# Patient Record
Sex: Female | Born: 1945 | Race: White | Hispanic: No | Marital: Married | Smoking: Never smoker
Health system: Southern US, Community
[De-identification: ages and names within clinical notes are randomized; demographics above are authoritative.]

## PROBLEM LIST (undated history)

## (undated) DIAGNOSIS — G8929 Other chronic pain: Secondary | ICD-10-CM

## (undated) DIAGNOSIS — M4712 Other spondylosis with myelopathy, cervical region: Secondary | ICD-10-CM

## (undated) DIAGNOSIS — R110 Nausea: Secondary | ICD-10-CM

## (undated) DIAGNOSIS — R531 Weakness: Secondary | ICD-10-CM

## (undated) DIAGNOSIS — R519 Headache, unspecified: Secondary | ICD-10-CM

## (undated) DIAGNOSIS — I471 Supraventricular tachycardia, unspecified: Secondary | ICD-10-CM

## (undated) DIAGNOSIS — Z8489 Family history of other specified conditions: Secondary | ICD-10-CM

## (undated) DIAGNOSIS — R072 Precordial pain: Secondary | ICD-10-CM

## (undated) DIAGNOSIS — T8859XA Other complications of anesthesia, initial encounter: Secondary | ICD-10-CM

## (undated) DIAGNOSIS — I059 Rheumatic mitral valve disease, unspecified: Secondary | ICD-10-CM

## (undated) DIAGNOSIS — R634 Abnormal weight loss: Secondary | ICD-10-CM

## (undated) DIAGNOSIS — N6019 Diffuse cystic mastopathy of unspecified breast: Secondary | ICD-10-CM

## (undated) DIAGNOSIS — M1990 Unspecified osteoarthritis, unspecified site: Secondary | ICD-10-CM

## (undated) DIAGNOSIS — R011 Cardiac murmur, unspecified: Secondary | ICD-10-CM

## (undated) DIAGNOSIS — I341 Nonrheumatic mitral (valve) prolapse: Secondary | ICD-10-CM

## (undated) DIAGNOSIS — I10 Essential (primary) hypertension: Secondary | ICD-10-CM

## (undated) DIAGNOSIS — I781 Nevus, non-neoplastic: Secondary | ICD-10-CM

## (undated) DIAGNOSIS — Z87442 Personal history of urinary calculi: Secondary | ICD-10-CM

## (undated) DIAGNOSIS — E119 Type 2 diabetes mellitus without complications: Secondary | ICD-10-CM

## (undated) DIAGNOSIS — R51 Headache: Secondary | ICD-10-CM

## (undated) DIAGNOSIS — R1084 Generalized abdominal pain: Secondary | ICD-10-CM

## (undated) DIAGNOSIS — Z9889 Other specified postprocedural states: Secondary | ICD-10-CM

## (undated) DIAGNOSIS — T4145XA Adverse effect of unspecified anesthetic, initial encounter: Secondary | ICD-10-CM

## (undated) DIAGNOSIS — K5790 Diverticulosis of intestine, part unspecified, without perforation or abscess without bleeding: Secondary | ICD-10-CM

## (undated) DIAGNOSIS — E785 Hyperlipidemia, unspecified: Secondary | ICD-10-CM

## (undated) DIAGNOSIS — G542 Cervical root disorders, not elsewhere classified: Secondary | ICD-10-CM

## (undated) DIAGNOSIS — E871 Hypo-osmolality and hyponatremia: Secondary | ICD-10-CM

## (undated) DIAGNOSIS — R112 Nausea with vomiting, unspecified: Secondary | ICD-10-CM

## (undated) DIAGNOSIS — K529 Noninfective gastroenteritis and colitis, unspecified: Secondary | ICD-10-CM

## (undated) DIAGNOSIS — K559 Vascular disorder of intestine, unspecified: Secondary | ICD-10-CM

## (undated) DIAGNOSIS — J22 Unspecified acute lower respiratory infection: Secondary | ICD-10-CM

## (undated) DIAGNOSIS — R194 Change in bowel habit: Secondary | ICD-10-CM

## (undated) DIAGNOSIS — I493 Ventricular premature depolarization: Secondary | ICD-10-CM

## (undated) DIAGNOSIS — I4719 Other supraventricular tachycardia: Secondary | ICD-10-CM

## (undated) DIAGNOSIS — B370 Candidal stomatitis: Secondary | ICD-10-CM

## (undated) DIAGNOSIS — I209 Angina pectoris, unspecified: Secondary | ICD-10-CM

## (undated) DIAGNOSIS — K279 Peptic ulcer, site unspecified, unspecified as acute or chronic, without hemorrhage or perforation: Secondary | ICD-10-CM

## (undated) DIAGNOSIS — I8390 Asymptomatic varicose veins of unspecified lower extremity: Secondary | ICD-10-CM

## (undated) DIAGNOSIS — K5900 Constipation, unspecified: Secondary | ICD-10-CM

## (undated) DIAGNOSIS — I83893 Varicose veins of bilateral lower extremities with other complications: Secondary | ICD-10-CM

## (undated) DIAGNOSIS — D649 Anemia, unspecified: Secondary | ICD-10-CM

## (undated) DIAGNOSIS — K219 Gastro-esophageal reflux disease without esophagitis: Secondary | ICD-10-CM

## (undated) DIAGNOSIS — N3090 Cystitis, unspecified without hematuria: Secondary | ICD-10-CM

## (undated) HISTORY — DX: Abnormal weight loss: R63.4

## (undated) HISTORY — DX: Hyperlipidemia, unspecified: E78.5

## (undated) HISTORY — DX: Other supraventricular tachycardia: I47.19

## (undated) HISTORY — DX: Nonrheumatic mitral (valve) prolapse: I34.1

## (undated) HISTORY — DX: Cervical root disorders, not elsewhere classified: G54.2

## (undated) HISTORY — DX: Peptic ulcer, site unspecified, unspecified as acute or chronic, without hemorrhage or perforation: K27.9

## (undated) HISTORY — DX: Nevus, non-neoplastic: I78.1

## (undated) HISTORY — PX: HERNIA REPAIR: SHX51

## (undated) HISTORY — DX: Asymptomatic varicose veins of unspecified lower extremity: I83.90

## (undated) HISTORY — DX: Type 2 diabetes mellitus without complications: E11.9

## (undated) HISTORY — DX: Diffuse cystic mastopathy of unspecified breast: N60.19

## (undated) HISTORY — PX: APPENDECTOMY: SHX54

## (undated) HISTORY — PX: ANTERIOR AND POSTERIOR VAGINAL REPAIR: SUR5

## (undated) HISTORY — DX: Vascular disorder of intestine, unspecified: K55.9

## (undated) HISTORY — PX: BREAST LUMPECTOMY: SHX2

## (undated) HISTORY — PX: ABDOMINAL HYSTERECTOMY: SHX81

## (undated) HISTORY — DX: Noninfective gastroenteritis and colitis, unspecified: K52.9

## (undated) HISTORY — DX: Constipation, unspecified: K59.00

## (undated) HISTORY — DX: Essential (primary) hypertension: I10

## (undated) HISTORY — DX: Candidal stomatitis: B37.0

## (undated) HISTORY — DX: Diverticulosis of intestine, part unspecified, without perforation or abscess without bleeding: K57.90

## (undated) HISTORY — DX: Hypo-osmolality and hyponatremia: E87.1

## (undated) HISTORY — DX: Ventricular premature depolarization: I49.3

## (undated) HISTORY — DX: Weakness: R53.1

## (undated) HISTORY — DX: Rheumatic mitral valve disease, unspecified: I05.9

## (undated) HISTORY — DX: Other chronic pain: G89.29

## (undated) HISTORY — DX: Varicose veins of bilateral lower extremities with other complications: I83.893

## (undated) HISTORY — DX: Generalized abdominal pain: R10.84

## (undated) HISTORY — DX: Change in bowel habit: R19.4

## (undated) HISTORY — DX: Unspecified acute lower respiratory infection: J22

## (undated) HISTORY — DX: Precordial pain: R07.2

## (undated) HISTORY — PX: CHOLECYSTECTOMY: SHX55

## (undated) HISTORY — PX: ELBOW SURGERY: SHX618

## (undated) HISTORY — DX: Supraventricular tachycardia: I47.1

## (undated) HISTORY — DX: Supraventricular tachycardia, unspecified: I47.10

## (undated) HISTORY — DX: Nausea: R11.0

## (undated) HISTORY — DX: Cystitis, unspecified without hematuria: N30.90

## (undated) HISTORY — DX: Other spondylosis with myelopathy, cervical region: M47.12

---

## 1997-07-23 ENCOUNTER — Other Ambulatory Visit: Admission: RE | Admit: 1997-07-23 | Discharge: 1997-07-23 | Payer: Self-pay | Admitting: Obstetrics and Gynecology

## 1998-10-08 ENCOUNTER — Other Ambulatory Visit: Admission: RE | Admit: 1998-10-08 | Discharge: 1998-10-08 | Payer: Self-pay | Admitting: Obstetrics and Gynecology

## 2001-07-14 ENCOUNTER — Other Ambulatory Visit: Admission: RE | Admit: 2001-07-14 | Discharge: 2001-07-14 | Payer: Self-pay | Admitting: Obstetrics and Gynecology

## 2001-07-22 ENCOUNTER — Ambulatory Visit (HOSPITAL_COMMUNITY): Admission: RE | Admit: 2001-07-22 | Discharge: 2001-07-22 | Payer: Self-pay | Admitting: Gastroenterology

## 2001-07-22 ENCOUNTER — Encounter: Payer: Self-pay | Admitting: Gastroenterology

## 2002-12-21 ENCOUNTER — Ambulatory Visit (HOSPITAL_COMMUNITY): Admission: RE | Admit: 2002-12-21 | Discharge: 2002-12-21 | Payer: Self-pay | Admitting: Gastroenterology

## 2002-12-29 ENCOUNTER — Ambulatory Visit (HOSPITAL_COMMUNITY): Admission: RE | Admit: 2002-12-29 | Discharge: 2002-12-29 | Payer: Self-pay | Admitting: Gastroenterology

## 2002-12-29 ENCOUNTER — Encounter: Payer: Self-pay | Admitting: Gastroenterology

## 2004-02-22 ENCOUNTER — Ambulatory Visit: Payer: Self-pay | Admitting: Family Medicine

## 2004-07-30 ENCOUNTER — Ambulatory Visit: Payer: Self-pay | Admitting: Family Medicine

## 2004-12-08 ENCOUNTER — Ambulatory Visit: Payer: Self-pay | Admitting: Family Medicine

## 2005-02-02 ENCOUNTER — Ambulatory Visit: Payer: Self-pay | Admitting: Family Medicine

## 2006-08-25 ENCOUNTER — Encounter: Admission: RE | Admit: 2006-08-25 | Discharge: 2006-08-25 | Payer: Self-pay | Admitting: Orthopedic Surgery

## 2013-01-02 ENCOUNTER — Other Ambulatory Visit: Payer: Self-pay | Admitting: *Deleted

## 2013-01-02 ENCOUNTER — Encounter: Payer: Self-pay | Admitting: Surgery

## 2013-01-02 DIAGNOSIS — I83893 Varicose veins of bilateral lower extremities with other complications: Secondary | ICD-10-CM

## 2013-01-27 ENCOUNTER — Encounter: Payer: Self-pay | Admitting: Surgery

## 2013-01-30 ENCOUNTER — Inpatient Hospital Stay (HOSPITAL_COMMUNITY): Admission: RE | Admit: 2013-01-30 | Payer: Self-pay | Source: Ambulatory Visit

## 2013-01-30 ENCOUNTER — Encounter (INDEPENDENT_AMBULATORY_CARE_PROVIDER_SITE_OTHER): Payer: Self-pay

## 2013-01-30 ENCOUNTER — Ambulatory Visit (HOSPITAL_COMMUNITY)
Admission: RE | Admit: 2013-01-30 | Discharge: 2013-01-30 | Disposition: A | Discharge status: Home / Self Care | Payer: BC Managed Care – PPO | Source: Ambulatory Visit | Attending: Surgery | Admitting: Surgery

## 2013-01-30 ENCOUNTER — Other Ambulatory Visit: Payer: Self-pay | Admitting: *Deleted

## 2013-01-30 ENCOUNTER — Ambulatory Visit (INDEPENDENT_AMBULATORY_CARE_PROVIDER_SITE_OTHER): Payer: BC Managed Care – PPO | Admitting: Vascular Surgery

## 2013-01-30 ENCOUNTER — Encounter: Payer: Self-pay | Admitting: Vascular Surgery

## 2013-01-30 ENCOUNTER — Encounter: Payer: Self-pay | Admitting: Surgery

## 2013-01-30 VITALS — BP 133/78 | HR 65 | Resp 16 | Ht 66.0 in | Wt 129.0 lb

## 2013-01-30 DIAGNOSIS — I83893 Varicose veins of bilateral lower extremities with other complications: Secondary | ICD-10-CM | POA: Insufficient documentation

## 2013-01-30 DIAGNOSIS — M79609 Pain in unspecified limb: Secondary | ICD-10-CM | POA: Insufficient documentation

## 2013-01-30 NOTE — Progress Notes (Signed)
Subjective:     Patient ID: Jasmine Farrell, female   DOB: [birth year], 67 y.o.   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 Her job requires her to stand for many hours and she has increasing discomfort as this occurs. She does wear long light elastic compression stockings with some but not complete improvement. She has a remote history of a possible DVT in the right femoral vein following a cardiac catheterization was treated briefly with Coumadin but has had no further sequela. Has no history of stasis ulcers or bleeding.  Past Medical History  Diagnosis Date  . Varicose veins   . Diabetes mellitus without complication   . Hyperlipidemia   . Mitral valve disorder   . Paroxysmal atrial tachycardia   . PVC's (premature ventricular contractions)   . Diffuse cystic mastopathy     History  Substance Use Topics  . Smoking status: Never Smoker   . Smokeless tobacco: Never Used  . Alcohol Use: No    Family History  Problem Relation Age of Onset  . Cancer Father     Allergies  Allergen Reactions  . Aspirin   . Codeine   . Latex   . Morphine And Related   . Nsaids   . Statins     Current outpatient prescriptions:benzonatate (TESSALON) 200 MG capsule, Take 200 mg by mouth 3 (three) times daily as needed for cough., Disp: , Rfl: ;  Calcium Carbonate-Vitamin D (CALCIUM 600 + D PO), Take by mouth daily., Disp: , Rfl: ;  cholecalciferol (VITAMIN D) 1000 UNITS tablet, Take 1,000 Units by mouth daily., Disp: , Rfl:  lidocaine (LIDODERM) 5 %, Place 1 patch onto the skin daily. Remove & Discard patch within 12 hours or as directed by MD, Disp: , Rfl: ;  metFORMIN (GLUCOPHAGE) 500 MG tablet, Take 500 mg by mouth daily. Takes 1/2 tablet daily., Disp: , Rfl: ;  ondansetron (ZOFRAN) 4 MG tablet, Take 4 mg by mouth every 8 (eight) hours as needed for  nausea., Disp: , Rfl: ;  propranolol (INDERAL) 80 MG tablet, Take 80 mg by mouth daily., Disp: , Rfl:  ranitidine (ZANTAC) 300 MG tablet, Take 300 mg by mouth at bedtime., Disp: , Rfl: ;  ibuprofen (ADVIL,MOTRIN) 800 MG tablet, Take 800 mg by mouth every 8 (eight) hours as needed for pain., Disp: , Rfl:   BP 133/78  Pulse 65  Resp 16  Ht 5\' 6"  (1.676 m)  Wt 129 lb (58.514 kg)  BMI 20.83 kg/m2  Body mass index is 20.83 kg/(m^2).         Review of Systems denies chest pain but does have occasional orthopnea and dyspnea on exertion. Has a history of cardiac arrhythmias and mitral valve prolapse. Complains of weakness in the arms and legs and numbness in the legs as well as dizziness. Also skin rashes are noted. All other systems negative and a complete review of     Objective:   Physical Exam BP 133/78  Pulse 65  Resp 16  Ht 5\' 6"  (1.676 m)  Wt 129 lb (58.514 kg)  BMI 20.83 kg/m2  Gen.-alert and oriented x3 in no apparent distress HEENT normal for age Lungs no rhonchi or wheezing Cardiovascular regular rhythm no murmurs carotid pulses 3+ palpable no bruits audible Abdomen soft nontender no palpable masses Musculoskeletal free of  major deformities  Skin clear -no rashes Neurologic normal Lower extremities 3+ femoral and dorsalis pedis pulses palpable bilaterally with no edema there are scattered spider and reticular veins in both lower extremities the worst area being the right posterior thigh and popliteal fossa. There is also some in the anterior thighs bilaterally. No distal edema is noted. No hyperpigmentation or ulceration is noted.  Today I ordered bilateral venous duplex exam which I reviewed and interpreted. There is no reflux in the superficial systems bilaterally and there is no DVT. Study is essentially normal.        Assessment:     Bilateral symptomatic spider and reticular veins with no superficial or deep venous reflux    Plan:     Have offered patient  sclerotherapy and she will consider this

## 2013-02-07 ENCOUNTER — Encounter: Payer: Self-pay | Admitting: *Deleted

## 2013-02-08 ENCOUNTER — Ambulatory Visit (INDEPENDENT_AMBULATORY_CARE_PROVIDER_SITE_OTHER): Payer: Self-pay | Admitting: *Deleted

## 2013-02-08 DIAGNOSIS — I781 Nevus, non-neoplastic: Secondary | ICD-10-CM

## 2013-02-08 NOTE — Progress Notes (Signed)
X=.3% Sotradecol and .5% Asclera administered with a 27g butterfly.  Patient received a total of 15cc.  Treated majority of her extensive spiders with a combo of sotradecol and asclera. Easy access. Tol well. Will follow prn.  Photos: yes  Compression stockings applied: yes

## 2013-02-09 ENCOUNTER — Encounter: Payer: Self-pay | Admitting: Vascular Surgery

## 2013-02-16 ENCOUNTER — Other Ambulatory Visit: Payer: Self-pay

## 2013-06-27 ENCOUNTER — Encounter: Payer: Self-pay | Admitting: *Deleted

## 2013-06-28 ENCOUNTER — Encounter: Payer: Self-pay | Admitting: Vascular Surgery

## 2013-06-28 ENCOUNTER — Ambulatory Visit (INDEPENDENT_AMBULATORY_CARE_PROVIDER_SITE_OTHER): Payer: Self-pay | Admitting: *Deleted

## 2013-06-28 DIAGNOSIS — I781 Nevus, non-neoplastic: Secondary | ICD-10-CM

## 2013-06-28 NOTE — Progress Notes (Signed)
X=.3% Sotradecol administered with a 27g butterfly.  Patient received a total of 5cc.  Having a good result from first tx. Did further clean up. She may need one more tx in 6 months. Tol well. Easy access. Follow prn.  Photos: yes  Compression stockings applied: yes

## 2015-09-27 DIAGNOSIS — M25512 Pain in left shoulder: Secondary | ICD-10-CM

## 2015-09-27 HISTORY — DX: Pain in left shoulder: M25.512

## 2015-10-11 DIAGNOSIS — R0789 Other chest pain: Secondary | ICD-10-CM

## 2015-10-11 HISTORY — DX: Other chest pain: R07.89

## 2016-01-29 DIAGNOSIS — M542 Cervicalgia: Secondary | ICD-10-CM | POA: Insufficient documentation

## 2016-01-29 DIAGNOSIS — M5030 Other cervical disc degeneration, unspecified cervical region: Secondary | ICD-10-CM | POA: Insufficient documentation

## 2016-01-29 HISTORY — DX: Cervicalgia: M54.2

## 2016-01-29 HISTORY — DX: Other cervical disc degeneration, unspecified cervical region: M50.30

## 2016-02-13 DIAGNOSIS — M48061 Spinal stenosis, lumbar region without neurogenic claudication: Secondary | ICD-10-CM

## 2016-02-13 HISTORY — DX: Spinal stenosis, lumbar region without neurogenic claudication: M48.061

## 2016-02-18 DIAGNOSIS — M5136 Other intervertebral disc degeneration, lumbar region: Secondary | ICD-10-CM | POA: Insufficient documentation

## 2016-02-18 DIAGNOSIS — M51369 Other intervertebral disc degeneration, lumbar region without mention of lumbar back pain or lower extremity pain: Secondary | ICD-10-CM

## 2016-02-18 HISTORY — DX: Other intervertebral disc degeneration, lumbar region without mention of lumbar back pain or lower extremity pain: M51.369

## 2016-06-26 ENCOUNTER — Other Ambulatory Visit: Payer: Self-pay | Admitting: Neurosurgery

## 2016-08-13 ENCOUNTER — Encounter (HOSPITAL_COMMUNITY): Payer: Self-pay

## 2016-08-13 ENCOUNTER — Encounter (HOSPITAL_COMMUNITY)
Admission: RE | Admit: 2016-08-13 | Discharge: 2016-08-13 | Disposition: A | Discharge status: Home / Self Care | Payer: Medicare Other | Source: Ambulatory Visit | Attending: Neurosurgery | Admitting: Neurosurgery

## 2016-08-13 DIAGNOSIS — E119 Type 2 diabetes mellitus without complications: Secondary | ICD-10-CM | POA: Diagnosis not present

## 2016-08-13 DIAGNOSIS — K219 Gastro-esophageal reflux disease without esophagitis: Secondary | ICD-10-CM | POA: Diagnosis not present

## 2016-08-13 DIAGNOSIS — E785 Hyperlipidemia, unspecified: Secondary | ICD-10-CM | POA: Insufficient documentation

## 2016-08-13 DIAGNOSIS — Z01812 Encounter for preprocedural laboratory examination: Secondary | ICD-10-CM | POA: Insufficient documentation

## 2016-08-13 DIAGNOSIS — Z7984 Long term (current) use of oral hypoglycemic drugs: Secondary | ICD-10-CM | POA: Diagnosis not present

## 2016-08-13 DIAGNOSIS — Z79899 Other long term (current) drug therapy: Secondary | ICD-10-CM | POA: Insufficient documentation

## 2016-08-13 DIAGNOSIS — D649 Anemia, unspecified: Secondary | ICD-10-CM | POA: Insufficient documentation

## 2016-08-13 DIAGNOSIS — I480 Paroxysmal atrial fibrillation: Secondary | ICD-10-CM | POA: Diagnosis not present

## 2016-08-13 HISTORY — DX: Nausea with vomiting, unspecified: Z98.890

## 2016-08-13 HISTORY — DX: Personal history of urinary calculi: Z87.442

## 2016-08-13 HISTORY — DX: Headache, unspecified: R51.9

## 2016-08-13 HISTORY — DX: Anemia, unspecified: D64.9

## 2016-08-13 HISTORY — DX: Nausea with vomiting, unspecified: R11.2

## 2016-08-13 HISTORY — DX: Cardiac murmur, unspecified: R01.1

## 2016-08-13 HISTORY — DX: Other complications of anesthesia, initial encounter: T88.59XA

## 2016-08-13 HISTORY — DX: Angina pectoris, unspecified: I20.9

## 2016-08-13 HISTORY — DX: Family history of other specified conditions: Z84.89

## 2016-08-13 HISTORY — DX: Gastro-esophageal reflux disease without esophagitis: K21.9

## 2016-08-13 HISTORY — DX: Headache: R51

## 2016-08-13 HISTORY — DX: Adverse effect of unspecified anesthetic, initial encounter: T41.45XA

## 2016-08-13 HISTORY — DX: Unspecified osteoarthritis, unspecified site: M19.90

## 2016-08-13 LAB — TYPE AND SCREEN
ABO/RH(D): O NEG
Antibody Screen: NEGATIVE

## 2016-08-13 LAB — CBC
HEMATOCRIT: 39.4 % (ref 36.0–46.0)
Hemoglobin: 13.3 g/dL (ref 12.0–15.0)
MCH: 30.2 pg (ref 26.0–34.0)
MCHC: 33.8 g/dL (ref 30.0–36.0)
MCV: 89.3 fL (ref 78.0–100.0)
Platelets: 220 10*3/uL (ref 150–400)
RBC: 4.41 MIL/uL (ref 3.87–5.11)
RDW: 13.3 % (ref 11.5–15.5)
WBC: 6.7 10*3/uL (ref 4.0–10.5)

## 2016-08-13 LAB — BASIC METABOLIC PANEL
Anion gap: 6 (ref 5–15)
BUN: 19 mg/dL (ref 6–20)
CALCIUM: 9.6 mg/dL (ref 8.9–10.3)
CO2: 26 mmol/L (ref 22–32)
Chloride: 107 mmol/L (ref 101–111)
Creatinine, Ser: 1 mg/dL (ref 0.44–1.00)
GFR calc Af Amer: >60 mL/min (ref >60)
GFR, EST NON AFRICAN AMERICAN: 55 mL/min — AB (ref >60)
GLUCOSE: 98 mg/dL (ref 65–99)
Potassium: 4.1 mmol/L (ref 3.5–5.1)
Sodium: 139 mmol/L (ref 135–145)

## 2016-08-13 LAB — ABO/RH: ABO/RH(D): O NEG

## 2016-08-13 LAB — GLUCOSE, CAPILLARY: GLUCOSE-CAPILLARY: 103 mg/dL — AB (ref 65–99)

## 2016-08-13 LAB — SURGICAL PCR SCREEN
MRSA, PCR: NEGATIVE
STAPHYLOCOCCUS AUREUS: NEGATIVE

## 2016-08-13 NOTE — Pre-Procedure Instructions (Signed)
Jasmine Farrell  08/13/2016      [facility] - Jasmine Farrell, [place] - [address] AT [facility] [address] [place] [address] Phone: [phone] Fax: [phone]    Your procedure is scheduled on May 9.  Report to [facility] Admitting at 815 A.M.  Call this number if you have problems the morning of surgery:  [phone]   Remember:  Do not eat food or drink liquids after midnight.  Take these medicines the morning of surgery with A SIP OF WATER Propranolol (Inderal), Propranolol (Innopran XL), tramadol (Ultram)   Stop taking aspirin, BC's, Goody's, Herbal medications, Ibuprofen, Advil. Motrin, Aleve, Viamins    How to Manage Your Diabetes Before and After Surgery  Why is it important to control my blood sugar before and after surgery? . Improving blood sugar levels before and after surgery helps healing and can limit problems. . A way of improving blood sugar control is eating a healthy diet by: o  Eating less sugar and carbohydrates o  Increasing activity/exercise o  Talking with your doctor about reaching your blood sugar goals . High blood sugars (greater than 180 mg/dL) can raise your risk of infections and slow your recovery, so you will need to focus on controlling your diabetes during the weeks before surgery. . Make sure that the doctor who takes care of your diabetes knows about your planned surgery including the date and location.  How do I manage my blood sugar before surgery? . Check your blood sugar at least 4 times a day, starting 2 days before surgery, to make sure that the level is not too high or low. o Check your blood sugar the morning of your surgery when you wake up and every 2 hours until you get to the Short Stay unit. . If your blood sugar is less than 70 mg/dL, you will need to treat for low blood sugar: o Do not take insulin. o Treat a low blood sugar (less than 70 mg/dL)  with  cup of clear juice (cranberry or apple), 4 glucose tablets, OR glucose gel. o Recheck blood sugar in 15 minutes after treatment (to make sure it is greater than 70 mg/dL). If your blood sugar is not greater than 70 mg/dL on recheck, call [phone] for further instructions. . Report your blood sugar to the short stay nurse when you get to Short Stay.  . If you are admitted to the hospital after surgery: o Your blood sugar will be checked by the staff and you will probably be given insulin after surgery (instead of oral diabetes medicines) to make sure you have good blood sugar levels. o The goal for blood sugar control after surgery is 80-180 mg/dL.      WHAT DO I DO ABOUT MY DIABETES MEDICATION?   Marland Kitchen Do not take oral diabetes medicines (pills) the morning of surgery.Metformin (Glucophage)       . The day of surgery, do not take other diabetes injectables, including Byetta (exenatide), Bydureon (exenatide ER), Victoza (liraglutide), or Trulicity (dulaglutide).  . If your CBG is greater than 220 mg/dL, you may take  of your sliding scale (correction) dose of insulin.  Other Instructions:          Patient Signature:  Date:   Nurse Signature:  Date:   Reviewed and Endorsed by [facility] Patient Education Committee, August 2015  Do not wear jewelry, make-up or nail  polish.  Do not wear lotions, powders, or perfumes, or deoderant.  Do not shave 48 hours prior to surgery.  Men may shave face and neck.  Do not bring valuables to the hospital.  [facility] is not responsible for any belongings or valuables.  Contacts, dentures or bridgework may not be worn into surgery.  Leave your suitcase in the car.  After surgery it may be brought to your room.  For patients admitted to the hospital, discharge time will be determined by your treatment team.  Patients discharged the day of surgery will not be allowed to drive home.    Special instructions:   - Preparing  for Surgery  Before surgery, you can play an important role.  Because skin is not sterile, your skin needs to be as free of germs as possible.  You can reduce the number of germs on you skin by washing with CHG (chlorahexidine gluconate) soap before surgery.  CHG is an antiseptic cleaner which kills germs and bonds with the skin to continue killing germs even after washing.  Please DO NOT use if you have an allergy to CHG or antibacterial soaps.  If your skin becomes reddened/irritated stop using the CHG and inform your nurse when you arrive at Short Stay.  Do not shave (including legs and underarms) for at least 48 hours prior to the first CHG shower.  You may shave your face.  Please follow these instructions carefully:   1.  Shower with CHG Soap the night before surgery and the  morning of Surgery.  2.  If you choose to wash your hair, wash your hair first as usual with your   normal shampoo.  3.  After you shampoo, rinse your hair and body thoroughly to remove the  Shampoo.  4.  Use CHG as you would any other liquid soap.  You can apply chg directly  to the skin and wash gently with scrungie or a clean washcloth.  5.  Apply the CHG Soap to your body ONLY FROM THE NECK DOWN.   Do not use on open wounds or open sores.  Avoid contact with your eyes, ears, mouth and genitals (private parts).  Wash genitals (private parts) with your normal soap.  6.  Wash thoroughly, paying special attention to the area where your surgery  will be performed.  7.  Thoroughly rinse your body with warm water from the neck down.  8.  DO NOT shower/wash with your normal soap after using and rinsing off  the CHG Soap.  9.  Pat yourself dry with a clean towel.            10.  Wear clean pajamas.            11.  Place clean sheets on your bed the night of your first shower and do not sleep with pets.  Day of Surgery  Do not apply any lotions/deoderants the morning of surgery.  Please wear clean clothes to the  hospital/surgery center.     Please read over the following fact sheets that you were given. Pain Booklet, Coughing and Deep Breathing, MRSA Information and Surgical Site Infection Prevention

## 2016-08-13 NOTE — Progress Notes (Signed)
PCP is Dr. Irena Reichmannana Collins States she saw Dr Tomie Chinaevankar last about 5 years ago.  States she had a stress test 5 years ago-requested from Dr Tomie Chinaevankar States she had a card cath 20 years or more ago. States she had an echo more than 5 years ago.- [facility] and Dr Tomie Chinaevankar Reports her fasting CBG"s run 100 or a little below.  She had a EKG done this am at Dr [facility]'s office- requested report.  Denies any chest pain. Reports a narrow airway Revonda Standardllison called to see pt.

## 2016-08-13 NOTE — Progress Notes (Addendum)
Anesthesia PAT Evaluation: Patient is a 71 year old female scheduled for C4-5, C5-6 ACDF on 08/19/16 by Dr. Lovell Sheehan.  History includes murmur/MV disorder (MVP), paroxysmal atrial tachycardia, PVCs, GERD, hyperlipidemia, diabetes mellitus type 2, anemia, arthritis, nephrolithiasis, headaches, varicose veins (s/p sclerotherapy), postoperative N/V, appendectomy, cholecystectomy '90's, hysterectomy, right breast lumpectomy, right inguinal hernia repair [facility]).   For anesthesia history she reported small airway, hypotension with nerve block, and difficulty waking up after anesthesia ("light weight"). She denied history of awake intubation.   She reported that > 20 years ago she had "V-tach" during heart catheterization that did not show any CAD (reportedly done at [facility] by Dr. Corinda Gubler or Dr. Juanda Chance). Possible catheter inducted VT. She said cath was done due to "angina" and palpitations, but denied any recent chest pains. She was last seen by Dr. Tomie China in [place] ~ 5 years ago with normal stress test. 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 She is on propranolol, without acute event in ~ 20 years other than increased ectopy when brand name Inderal was changed to generic propanolol. (She reports she is now back on a specific generic propranolol which she will bring with her to the hospital in case her brand is not formulary.)  She denied SOB, edema. Last year while dancing she had what sounded like a presyncopal episode while dancing. She reports a prior echo, but unsure of the date. She remains active--still going to the gym and walking 3 miles and playing pickle ball. She is no longer using machines due to her cervical precautions. She is currently wearing a soft collar.   PCP is Dr. Irena Reichmann ([facility]; Care Everywhere), last visit 08/13/16 for preoperative evaluation with EKG. Patient was  medically cleared for surgery pending acceptable lab results.   Meds include Lidoderm, metformin, Phenergan, propanolol, Zantac, tramadol.  BP 123/65   Pulse 74   Temp 36.7 C (Oral)   Resp 18   Ht 5\' 5"  (1.651 m)   Wt 132 lb 15 oz (60.3 kg)   SpO2 100%   BMI 22.12 kg/m   Heart RRR. I did not appreciate a murmur. Lungs clear. She is wearing a cervical soft collar. Mallampati II. Mouth opening appearing > 3FB. Somewhat prominent teeth. No LE edema noted.   EKG 08/13/16 (PCP): Requested.  Preoperative labs noted. CBC WNL. Cr 1.00. Glucose 98. A1c pending. She reports CBGs typically < 100 (up to 200 when on prednisone last year).   I will revisit chart once additional records received. Last cardiology and anesthesia records requested.  Velna Ochs [facility] Short Stay Center/Anesthesiology Phone [phone] 08/13/2016 7:03 PM  Addendum: Stress test received from [facility]. No cardiologist office note received. No echo at [facility] or [facility].   Nuclear stress test 09/21/06 [facility]): Conclusion: Stress portion: Overall it was an uneventful adenosine infusion without evidence of ischemia on the EKG. Nuclear portion: No ischemia seen on the scan. Normal gated imaging. Normal ejection fraction.  08/06/09 anesthesia record (right breast lumpectomy) and 07/18/12 (right femoral hernia repair) anesthesia record received from [facility]. Patient had SAB on 07/18/12 (no complications noted). Patient had GETA using Miller #2 to place 6.5 ETT on 08/06/09. Notes indicate that difficult airway history was from a prior surgery at [facility].  I'm still awaiting her 08/13/16 EKG, but previous 2014 tracing showed NSR. If EKG is not received then she would need a  new EKG on the day of surgery.  Reviewed currently available information with anesthesiologist Dr. Aleene DavidsonE. Fitzgerald. If no acute changes then it is anticipated that she can proceed as planned. As above, her neck  is in a neurtral position with soft collar, so anticipate need for Glidescope.   Velna Ochsllison Letty Salvi, PA-C [facility] Short Stay Center/Anesthesiology Phone [phone] 08/14/2016 1:16 PM

## 2016-08-14 LAB — HEMOGLOBIN A1C
Hgb A1c MFr Bld: 7.6 % — ABNORMAL HIGH (ref 4.8–5.6)
Mean Plasma Glucose: 171 mg/dL

## 2016-08-19 ENCOUNTER — Inpatient Hospital Stay (HOSPITAL_COMMUNITY)
Admission: RE | Admit: 2016-08-19 | Discharge: 2016-08-21 | DRG: 472 | Disposition: A | Discharge status: Home / Self Care | Payer: Medicare Other | Source: Ambulatory Visit | Attending: Neurosurgery | Admitting: Neurosurgery

## 2016-08-19 ENCOUNTER — Inpatient Hospital Stay (HOSPITAL_COMMUNITY): Payer: Medicare Other | Admitting: Vascular Surgery

## 2016-08-19 ENCOUNTER — Inpatient Hospital Stay (HOSPITAL_COMMUNITY)
Admission: RE | Disposition: A | Discharge status: Home / Self Care | Payer: Self-pay | Source: Ambulatory Visit | Attending: Neurosurgery

## 2016-08-19 ENCOUNTER — Inpatient Hospital Stay (HOSPITAL_COMMUNITY): Payer: Medicare Other | Admitting: Anesthesiology

## 2016-08-19 ENCOUNTER — Encounter (HOSPITAL_COMMUNITY): Payer: Self-pay | Admitting: *Deleted

## 2016-08-19 ENCOUNTER — Inpatient Hospital Stay (HOSPITAL_COMMUNITY): Payer: Medicare Other

## 2016-08-19 DIAGNOSIS — Z90710 Acquired absence of both cervix and uterus: Secondary | ICD-10-CM | POA: Diagnosis not present

## 2016-08-19 DIAGNOSIS — Z7984 Long term (current) use of oral hypoglycemic drugs: Secondary | ICD-10-CM | POA: Diagnosis not present

## 2016-08-19 DIAGNOSIS — E119 Type 2 diabetes mellitus without complications: Secondary | ICD-10-CM | POA: Diagnosis present

## 2016-08-19 DIAGNOSIS — Z79891 Long term (current) use of opiate analgesic: Secondary | ICD-10-CM

## 2016-08-19 DIAGNOSIS — I8390 Asymptomatic varicose veins of unspecified lower extremity: Secondary | ICD-10-CM | POA: Diagnosis present

## 2016-08-19 DIAGNOSIS — K219 Gastro-esophageal reflux disease without esophagitis: Secondary | ICD-10-CM | POA: Diagnosis present

## 2016-08-19 DIAGNOSIS — M50121 Cervical disc disorder at C4-C5 level with radiculopathy: Principal | ICD-10-CM | POA: Diagnosis present

## 2016-08-19 DIAGNOSIS — E785 Hyperlipidemia, unspecified: Secondary | ICD-10-CM | POA: Diagnosis present

## 2016-08-19 DIAGNOSIS — Z87442 Personal history of urinary calculi: Secondary | ICD-10-CM

## 2016-08-19 DIAGNOSIS — Z91040 Latex allergy status: Secondary | ICD-10-CM

## 2016-08-19 DIAGNOSIS — Z886 Allergy status to analgesic agent status: Secondary | ICD-10-CM | POA: Diagnosis not present

## 2016-08-19 DIAGNOSIS — Z888 Allergy status to other drugs, medicaments and biological substances status: Secondary | ICD-10-CM | POA: Diagnosis not present

## 2016-08-19 DIAGNOSIS — M4712 Other spondylosis with myelopathy, cervical region: Secondary | ICD-10-CM | POA: Diagnosis present

## 2016-08-19 DIAGNOSIS — Z791 Long term (current) use of non-steroidal anti-inflammatories (NSAID): Secondary | ICD-10-CM | POA: Diagnosis not present

## 2016-08-19 DIAGNOSIS — Z9049 Acquired absence of other specified parts of digestive tract: Secondary | ICD-10-CM

## 2016-08-19 DIAGNOSIS — I10 Essential (primary) hypertension: Secondary | ICD-10-CM | POA: Diagnosis present

## 2016-08-19 DIAGNOSIS — Z885 Allergy status to narcotic agent status: Secondary | ICD-10-CM | POA: Diagnosis not present

## 2016-08-19 DIAGNOSIS — I493 Ventricular premature depolarization: Secondary | ICD-10-CM | POA: Diagnosis present

## 2016-08-19 DIAGNOSIS — M1990 Unspecified osteoarthritis, unspecified site: Secondary | ICD-10-CM | POA: Diagnosis present

## 2016-08-19 DIAGNOSIS — I348 Other nonrheumatic mitral valve disorders: Secondary | ICD-10-CM | POA: Diagnosis present

## 2016-08-19 DIAGNOSIS — Z79899 Other long term (current) drug therapy: Secondary | ICD-10-CM | POA: Diagnosis not present

## 2016-08-19 DIAGNOSIS — M4802 Spinal stenosis, cervical region: Secondary | ICD-10-CM | POA: Diagnosis present

## 2016-08-19 DIAGNOSIS — Z419 Encounter for procedure for purposes other than remedying health state, unspecified: Secondary | ICD-10-CM

## 2016-08-19 DIAGNOSIS — M4722 Other spondylosis with radiculopathy, cervical region: Secondary | ICD-10-CM

## 2016-08-19 DIAGNOSIS — M5010 Cervical disc disorder with radiculopathy, unspecified cervical region: Secondary | ICD-10-CM | POA: Diagnosis present

## 2016-08-19 DIAGNOSIS — R011 Cardiac murmur, unspecified: Secondary | ICD-10-CM | POA: Diagnosis present

## 2016-08-19 HISTORY — PX: ANTERIOR CERVICAL DECOMP/DISCECTOMY FUSION: SHX1161

## 2016-08-19 LAB — GLUCOSE, CAPILLARY
GLUCOSE-CAPILLARY: 129 mg/dL — AB (ref 65–99)
GLUCOSE-CAPILLARY: 229 mg/dL — AB (ref 65–99)
Glucose-Capillary: 132 mg/dL — ABNORMAL HIGH (ref 65–99)
Glucose-Capillary: 138 mg/dL — ABNORMAL HIGH (ref 65–99)
Glucose-Capillary: 158 mg/dL — ABNORMAL HIGH (ref 65–99)

## 2016-08-19 SURGERY — ANTERIOR CERVICAL DECOMPRESSION/DISCECTOMY FUSION 2 LEVELS
Anesthesia: General

## 2016-08-19 MED ORDER — HYDROMORPHONE HCL 2 MG PO TABS: 2.0000 mg | ORAL_TABLET | ORAL | Status: DC | PRN

## 2016-08-19 MED ORDER — ONDANSETRON HCL 4 MG PO TABS: 4.0000 mg | ORAL_TABLET | Freq: Four times a day (QID) | ORAL | Status: DC | PRN

## 2016-08-19 MED ORDER — ONDANSETRON HCL 4 MG/2ML IJ SOLN
INTRAMUSCULAR | Status: DC | PRN
  Administered 2016-08-19: 4 mg via INTRAVENOUS

## 2016-08-19 MED ORDER — ACETAMINOPHEN 325 MG PO TABS: 650.0000 mg | ORAL_TABLET | ORAL | Status: DC | PRN

## 2016-08-19 MED ORDER — LACTATED RINGERS IV SOLN
INTRAVENOUS | Status: DC
  Administered 2016-08-19: 09:00:00 via INTRAVENOUS

## 2016-08-19 MED ORDER — HEMOSTATIC AGENTS (NO CHARGE) OPTIME
TOPICAL | Status: DC | PRN
  Administered 2016-08-19: 1 via TOPICAL

## 2016-08-19 MED ORDER — FENTANYL CITRATE (PF) 100 MCG/2ML IJ SOLN
INTRAMUSCULAR | Status: DC | PRN
  Administered 2016-08-19: 150 ug via INTRAVENOUS
  Administered 2016-08-19: 50 ug via INTRAVENOUS

## 2016-08-19 MED ORDER — PHENOL 1.4 % MT LIQD
1.0000 | OROMUCOSAL | Status: DC | PRN
  Filled 2016-08-19: qty 177

## 2016-08-19 MED ORDER — PHENYLEPHRINE HCL 10 MG/ML IJ SOLN
INTRAMUSCULAR | Status: DC | PRN
  Administered 2016-08-19: 80 ug via INTRAVENOUS

## 2016-08-19 MED ORDER — DOCUSATE SODIUM 100 MG PO CAPS
100.0000 mg | ORAL_CAPSULE | Freq: Two times a day (BID) | ORAL | Status: DC
  Administered 2016-08-19 – 2016-08-21 (×4): 100 mg via ORAL
  Filled 2016-08-19 (×4): qty 1

## 2016-08-19 MED ORDER — INSULIN ASPART 100 UNIT/ML ~~LOC~~ SOLN
0.0000 [IU] | Freq: Three times a day (TID) | SUBCUTANEOUS | Status: DC
  Administered 2016-08-20 (×2): 7 [IU] via SUBCUTANEOUS
  Administered 2016-08-20 – 2016-08-21 (×2): 4 [IU] via SUBCUTANEOUS

## 2016-08-19 MED ORDER — ONDANSETRON HCL 4 MG/2ML IJ SOLN
4.0000 mg | Freq: Four times a day (QID) | INTRAMUSCULAR | Status: DC | PRN
  Administered 2016-08-19: 4 mg via INTRAVENOUS
  Filled 2016-08-19: qty 2

## 2016-08-19 MED ORDER — INSULIN ASPART 100 UNIT/ML ~~LOC~~ SOLN
0.0000 [IU] | SUBCUTANEOUS | Status: DC
  Administered 2016-08-19: 7 [IU] via SUBCUTANEOUS

## 2016-08-19 MED ORDER — 0.9 % SODIUM CHLORIDE (POUR BTL) OPTIME
TOPICAL | Status: DC | PRN
  Administered 2016-08-19: 1000 mL

## 2016-08-19 MED ORDER — FAMOTIDINE 20 MG PO TABS
20.0000 mg | ORAL_TABLET | Freq: Two times a day (BID) | ORAL | Status: DC
  Administered 2016-08-19 – 2016-08-21 (×4): 20 mg via ORAL
  Filled 2016-08-19 (×4): qty 1

## 2016-08-19 MED ORDER — GLYCOPYRROLATE 0.2 MG/ML IJ SOLN
INTRAMUSCULAR | Status: DC | PRN
  Administered 2016-08-19: .4 mg via INTRAVENOUS

## 2016-08-19 MED ORDER — MENTHOL 3 MG MT LOZG: 1.0000 | LOZENGE | OROMUCOSAL | Status: DC | PRN

## 2016-08-19 MED ORDER — CEFAZOLIN SODIUM-DEXTROSE 2-4 GM/100ML-% IV SOLN
2.0000 g | Freq: Three times a day (TID) | INTRAVENOUS | Status: AC
  Administered 2016-08-19 – 2016-08-20 (×2): 2 g via INTRAVENOUS
  Filled 2016-08-19 (×2): qty 100

## 2016-08-19 MED ORDER — ONDANSETRON HCL 4 MG/2ML IJ SOLN
INTRAMUSCULAR | Status: AC
  Filled 2016-08-19: qty 2

## 2016-08-19 MED ORDER — HYDROMORPHONE HCL 1 MG/ML IJ SOLN
0.2500 mg | INTRAMUSCULAR | Status: DC | PRN
  Administered 2016-08-19 (×3): 0.5 mg via INTRAVENOUS

## 2016-08-19 MED ORDER — PROPRANOLOL HCL 80 MG PO TABS
80.0000 mg | ORAL_TABLET | Freq: Every day | ORAL | Status: DC
  Filled 2016-08-19 (×2): qty 1

## 2016-08-19 MED ORDER — BUPIVACAINE-EPINEPHRINE (PF) 0.5% -1:200000 IJ SOLN
INTRAMUSCULAR | Status: DC | PRN
  Administered 2016-08-19: 9 mL via PERINEURAL

## 2016-08-19 MED ORDER — CHLORHEXIDINE GLUCONATE CLOTH 2 % EX PADS: 6.0000 | MEDICATED_PAD | Freq: Once | CUTANEOUS | Status: DC

## 2016-08-19 MED ORDER — ROCURONIUM BROMIDE 10 MG/ML (PF) SYRINGE
PREFILLED_SYRINGE | INTRAVENOUS | Status: AC
  Filled 2016-08-19: qty 5

## 2016-08-19 MED ORDER — LACTATED RINGERS IV SOLN
INTRAVENOUS | Status: DC
  Administered 2016-08-19: 16:00:00 via INTRAVENOUS

## 2016-08-19 MED ORDER — PROPOFOL 10 MG/ML IV BOLUS
INTRAVENOUS | Status: AC
  Filled 2016-08-19: qty 40

## 2016-08-19 MED ORDER — NEOSTIGMINE METHYLSULFATE 5 MG/5ML IV SOSY
PREFILLED_SYRINGE | INTRAVENOUS | Status: AC
  Filled 2016-08-19: qty 5

## 2016-08-19 MED ORDER — THROMBIN 5000 UNITS EX SOLR
CUTANEOUS | Status: AC
  Filled 2016-08-19: qty 5000

## 2016-08-19 MED ORDER — ZOLPIDEM TARTRATE 5 MG PO TABS: 5.0000 mg | ORAL_TABLET | Freq: Every evening | ORAL | Status: DC | PRN

## 2016-08-19 MED ORDER — PHENYLEPHRINE 40 MCG/ML (10ML) SYRINGE FOR IV PUSH (FOR BLOOD PRESSURE SUPPORT)
PREFILLED_SYRINGE | INTRAVENOUS | Status: AC
  Filled 2016-08-19: qty 10

## 2016-08-19 MED ORDER — GELATIN ABSORBABLE MT POWD
OROMUCOSAL | Status: DC | PRN
Start: 2016-08-19 — End: 2016-08-19
  Administered 2016-08-19 (×2): via TOPICAL

## 2016-08-19 MED ORDER — PROMETHAZINE HCL 25 MG PO TABS
12.5000 mg | ORAL_TABLET | Freq: Four times a day (QID) | ORAL | Status: DC | PRN
  Administered 2016-08-19 – 2016-08-21 (×6): 12.5 mg via ORAL
  Filled 2016-08-19 (×6): qty 1

## 2016-08-19 MED ORDER — PHENYLEPHRINE HCL 10 MG/ML IJ SOLN
INTRAVENOUS | Status: DC | PRN
  Administered 2016-08-19: 25 ug/min via INTRAVENOUS

## 2016-08-19 MED ORDER — BISACODYL 10 MG RE SUPP: 10.0000 mg | Freq: Every day | RECTAL | Status: DC | PRN

## 2016-08-19 MED ORDER — LACTATED RINGERS IV SOLN
INTRAVENOUS | Status: DC | PRN
  Administered 2016-08-19 (×2): via INTRAVENOUS

## 2016-08-19 MED ORDER — HYDROMORPHONE HCL 1 MG/ML IJ SOLN: 0.2500 mg | INTRAMUSCULAR | Status: DC | PRN

## 2016-08-19 MED ORDER — TRAMADOL HCL 50 MG PO TABS: 50.0000 mg | ORAL_TABLET | Freq: Four times a day (QID) | ORAL | Status: DC | PRN

## 2016-08-19 MED ORDER — MIDAZOLAM HCL 2 MG/2ML IJ SOLN
INTRAMUSCULAR | Status: AC
  Filled 2016-08-19: qty 2

## 2016-08-19 MED ORDER — OXYCODONE HCL 5 MG PO TABS
5.0000 mg | ORAL_TABLET | Freq: Four times a day (QID) | ORAL | Status: DC | PRN
  Administered 2016-08-19 – 2016-08-21 (×7): 5 mg via ORAL
  Filled 2016-08-19 (×7): qty 1

## 2016-08-19 MED ORDER — MEPERIDINE HCL 25 MG/ML IJ SOLN: 6.2500 mg | INTRAMUSCULAR | Status: DC | PRN

## 2016-08-19 MED ORDER — BACITRACIN ZINC 500 UNIT/GM EX OINT
TOPICAL_OINTMENT | CUTANEOUS | Status: DC | PRN
  Administered 2016-08-19: 1 via TOPICAL

## 2016-08-19 MED ORDER — HYDROMORPHONE HCL 1 MG/ML IJ SOLN
INTRAMUSCULAR | Status: AC
  Filled 2016-08-19: qty 1

## 2016-08-19 MED ORDER — LIDOCAINE 2% (20 MG/ML) 5 ML SYRINGE
INTRAMUSCULAR | Status: AC
Start: 2016-08-19 — End: 2016-08-19
  Filled 2016-08-19: qty 5

## 2016-08-19 MED ORDER — ACETAMINOPHEN 650 MG RE SUPP: 650.0000 mg | RECTAL | Status: DC | PRN

## 2016-08-19 MED ORDER — PROMETHAZINE HCL 25 MG/ML IJ SOLN: 6.2500 mg | INTRAMUSCULAR | Status: DC | PRN

## 2016-08-19 MED ORDER — POLYVINYL ALCOHOL 1.4 % OP SOLN
1.0000 [drp] | Freq: Two times a day (BID) | OPHTHALMIC | Status: DC
  Administered 2016-08-20: 1 [drp] via OPHTHALMIC
  Filled 2016-08-19: qty 15

## 2016-08-19 MED ORDER — THROMBIN 5000 UNITS EX SOLR
CUTANEOUS | Status: AC
  Filled 2016-08-19: qty 15000

## 2016-08-19 MED ORDER — MIDAZOLAM HCL 2 MG/2ML IJ SOLN: 0.5000 mg | Freq: Once | INTRAMUSCULAR | Status: DC | PRN

## 2016-08-19 MED ORDER — NEOSTIGMINE METHYLSULFATE 10 MG/10ML IV SOLN
INTRAVENOUS | Status: DC | PRN
  Administered 2016-08-19: 3 mg via INTRAVENOUS

## 2016-08-19 MED ORDER — FENTANYL CITRATE (PF) 250 MCG/5ML IJ SOLN
INTRAMUSCULAR | Status: AC
  Filled 2016-08-19: qty 5

## 2016-08-19 MED ORDER — PROPOFOL 10 MG/ML IV BOLUS
INTRAVENOUS | Status: DC | PRN
  Administered 2016-08-19: 100 mg via INTRAVENOUS
  Administered 2016-08-19: 60 mg via INTRAVENOUS

## 2016-08-19 MED ORDER — THROMBIN 5000 UNITS EX SOLR
CUTANEOUS | Status: DC | PRN
Start: 2016-08-19 — End: 2016-08-19
  Administered 2016-08-19: 10000 [IU] via TOPICAL

## 2016-08-19 MED ORDER — BUPIVACAINE-EPINEPHRINE (PF) 0.5% -1:200000 IJ SOLN
INTRAMUSCULAR | Status: AC
  Filled 2016-08-19: qty 30

## 2016-08-19 MED ORDER — SODIUM CHLORIDE 0.9 % IR SOLN
Status: DC | PRN
  Administered 2016-08-19: 13:00:00

## 2016-08-19 MED ORDER — DEXAMETHASONE SODIUM PHOSPHATE 4 MG/ML IJ SOLN
4.0000 mg | Freq: Four times a day (QID) | INTRAMUSCULAR | Status: AC
  Administered 2016-08-19 (×2): 4 mg via INTRAVENOUS
  Filled 2016-08-19 (×2): qty 1

## 2016-08-19 MED ORDER — POLYETHYL GLYCOL-PROPYL GLYCOL 0.4-0.3 % OP SOLN: Freq: Two times a day (BID) | OPHTHALMIC | Status: DC

## 2016-08-19 MED ORDER — EPHEDRINE SULFATE 50 MG/ML IJ SOLN
INTRAMUSCULAR | Status: DC | PRN
  Administered 2016-08-19 (×2): 5 mg via INTRAVENOUS

## 2016-08-19 MED ORDER — EPHEDRINE 5 MG/ML INJ
INTRAVENOUS | Status: AC
  Filled 2016-08-19: qty 10

## 2016-08-19 MED ORDER — SCOPOLAMINE 1 MG/3DAYS TD PT72
1.0000 | MEDICATED_PATCH | Freq: Once | TRANSDERMAL | Status: AC
  Administered 2016-08-19: 1 via TRANSDERMAL

## 2016-08-19 MED ORDER — METFORMIN HCL 500 MG PO TABS
250.0000 mg | ORAL_TABLET | Freq: Every day | ORAL | Status: DC
  Administered 2016-08-20 – 2016-08-21 (×2): 250 mg via ORAL
  Filled 2016-08-19 (×2): qty 1

## 2016-08-19 MED ORDER — ALUM & MAG HYDROXIDE-SIMETH 200-200-20 MG/5ML PO SUSP: 30.0000 mL | Freq: Four times a day (QID) | ORAL | Status: DC | PRN

## 2016-08-19 MED ORDER — BACITRACIN ZINC 500 UNIT/GM EX OINT
TOPICAL_OINTMENT | CUTANEOUS | Status: AC
  Filled 2016-08-19: qty 28.35

## 2016-08-19 MED ORDER — CEFAZOLIN SODIUM-DEXTROSE 2-4 GM/100ML-% IV SOLN
2.0000 g | INTRAVENOUS | Status: AC
  Administered 2016-08-19: 2 g via INTRAVENOUS

## 2016-08-19 MED ORDER — HYDROMORPHONE HCL 1 MG/ML IJ SOLN: 0.5000 mg | INTRAMUSCULAR | Status: DC | PRN

## 2016-08-19 MED ORDER — DEXAMETHASONE 4 MG PO TABS
4.0000 mg | ORAL_TABLET | Freq: Four times a day (QID) | ORAL | Status: AC
  Administered 2016-08-20: 4 mg via ORAL
  Filled 2016-08-19: qty 1

## 2016-08-19 MED ORDER — INSULIN ASPART 100 UNIT/ML ~~LOC~~ SOLN: 0.0000 [IU] | Freq: Every day | SUBCUTANEOUS | Status: DC

## 2016-08-19 MED ORDER — HYDROMORPHONE HCL 1 MG/ML IJ SOLN
INTRAMUSCULAR | Status: AC
  Administered 2016-08-19: 0.5 mg via INTRAVENOUS
  Filled 2016-08-19: qty 1

## 2016-08-19 MED ORDER — LIDOCAINE HCL (CARDIAC) 20 MG/ML IV SOLN
INTRAVENOUS | Status: DC | PRN
  Administered 2016-08-19: 25 mg via INTRAVENOUS

## 2016-08-19 MED ORDER — DEXAMETHASONE SODIUM PHOSPHATE 10 MG/ML IJ SOLN
INTRAMUSCULAR | Status: DC | PRN
  Administered 2016-08-19: 10 mg via INTRAVENOUS

## 2016-08-19 MED ORDER — THROMBIN 5000 UNITS EX SOLR
CUTANEOUS | Status: AC
  Filled 2016-08-19: qty 10000

## 2016-08-19 SURGICAL SUPPLY — 54 items
APL SKNCLS STERI-STRIP NONHPOA (GAUZE/BANDAGES/DRESSINGS) ×1
BAG DECANTER FOR FLEXI CONT (MISCELLANEOUS) ×3 IMPLANT
BENZOIN TINCTURE PRP APPL 2/3 (GAUZE/BANDAGES/DRESSINGS) ×3 IMPLANT
BIT DRILL NEURO 2X3.1 SFT TUCH (MISCELLANEOUS) ×1 IMPLANT
BLADE ULTRA TIP 2M (BLADE) ×3 IMPLANT
BUR BARREL STRAIGHT FLUTE 4.0 (BURR) ×3 IMPLANT
BUR MATCHSTICK NEURO 3.0 LAGG (BURR) ×3 IMPLANT
CAGE PEEK VISTAS 11X14X6 (Cage) ×3 IMPLANT
CANISTER SUCT 3000ML PPV (MISCELLANEOUS) ×3 IMPLANT
CARTRIDGE OIL MAESTRO DRILL (MISCELLANEOUS) ×1 IMPLANT
CLOSURE WOUND 1/2 X4 (GAUZE/BANDAGES/DRESSINGS) ×1
COVER MAYO STAND STRL (DRAPES) ×3 IMPLANT
DIFFUSER DRILL AIR PNEUMATIC (MISCELLANEOUS) ×3 IMPLANT
DRAPE HALF SHEET 40X57 (DRAPES) ×3 IMPLANT
DRAPE LAPAROTOMY 100X72 PEDS (DRAPES) ×3 IMPLANT
DRAPE MICROSCOPE LEICA (MISCELLANEOUS) IMPLANT
DRAPE POUCH INSTRU U-SHP 10X18 (DRAPES) ×3 IMPLANT
DRAPE SURG 17X23 STRL (DRAPES) ×6 IMPLANT
DRILL NEURO 2X3.1 SOFT TOUCH (MISCELLANEOUS) ×3
ELECT REM PT RETURN 9FT ADLT (ELECTROSURGICAL) ×3
ELECTRODE REM PT RTRN 9FT ADLT (ELECTROSURGICAL) ×1 IMPLANT
GAUZE SPONGE 4X4 12PLY STRL (GAUZE/BANDAGES/DRESSINGS) ×3 IMPLANT
GAUZE SPONGE 4X4 16PLY XRAY LF (GAUZE/BANDAGES/DRESSINGS) IMPLANT
GLOVE SURG SS PI 6.5 STRL IVOR (GLOVE) ×3 IMPLANT
GOWN STRL REUS W/ TWL LRG LVL3 (GOWN DISPOSABLE) IMPLANT
GOWN STRL REUS W/ TWL XL LVL3 (GOWN DISPOSABLE) ×2 IMPLANT
GOWN STRL REUS W/TWL LRG LVL3 (GOWN DISPOSABLE)
GOWN STRL REUS W/TWL XL LVL3 (GOWN DISPOSABLE) ×6
HEMOSTAT POWDER KIT SURGIFOAM (HEMOSTASIS) ×6 IMPLANT
KIT BASIN OR (CUSTOM PROCEDURE TRAY) ×3 IMPLANT
KIT ROOM TURNOVER OR (KITS) ×3 IMPLANT
MARKER SKIN DUAL TIP RULER LAB (MISCELLANEOUS) ×3 IMPLANT
NEEDLE HYPO 22GX1.5 SAFETY (NEEDLE) ×3 IMPLANT
NEEDLE SPNL 18GX3.5 QUINCKE PK (NEEDLE) ×3 IMPLANT
NS IRRIG 1000ML POUR BTL (IV SOLUTION) ×3 IMPLANT
OIL CARTRIDGE MAESTRO DRILL (MISCELLANEOUS) ×3
PACK LAMINECTOMY NEURO (CUSTOM PROCEDURE TRAY) ×3 IMPLANT
PATTIES SURGICAL 1X1 (DISPOSABLE) ×3 IMPLANT
PEEK S VISTA 7X11X14 (Peek) ×3 IMPLANT
PIN DISTRACTION 14MM (PIN) ×6 IMPLANT
PLATE ANT CERV XTEND 2 LV 26 (Plate) ×3 IMPLANT
PUTTY KINEX BIOACTIVE 5CC (Bone Implant) ×3 IMPLANT
RUBBERBAND STERILE (MISCELLANEOUS) IMPLANT
SCREW XTD VAR 4.2 SELF TAP 12 (Screw) ×18 IMPLANT
SPONGE INTESTINAL PEANUT (DISPOSABLE) ×6 IMPLANT
SPONGE SURGIFOAM ABS GEL SZ50 (HEMOSTASIS) ×3 IMPLANT
STRIP CLOSURE SKIN 1/2X4 (GAUZE/BANDAGES/DRESSINGS) ×2 IMPLANT
SUT VIC AB 0 CT1 27 (SUTURE) ×3
SUT VIC AB 0 CT1 27XBRD ANTBC (SUTURE) ×1 IMPLANT
SUT VIC AB 3-0 SH 8-18 (SUTURE) ×6 IMPLANT
TAPE CLOTH SURG 4X10 WHT LF (GAUZE/BANDAGES/DRESSINGS) ×3 IMPLANT
TOWEL GREEN STERILE (TOWEL DISPOSABLE) ×3 IMPLANT
TOWEL GREEN STERILE FF (TOWEL DISPOSABLE) ×2 IMPLANT
WATER STERILE IRR 1000ML POUR (IV SOLUTION) ×3 IMPLANT

## 2016-08-19 NOTE — Anesthesia Procedure Notes (Signed)
Procedure Name: Intubation Date/Time: 08/19/2016 11:26 AM Performed by: Gwenyth AllegraADAMI, Nery Frappier Pre-anesthesia Checklist: Patient identified, Emergency Drugs available, Suction available, Patient being monitored and Timeout performed Patient Re-evaluated:Patient Re-evaluated prior to inductionOxygen Delivery Method: Circle system utilized Preoxygenation: Pre-oxygenation with 100% oxygen Intubation Type: IV induction Ventilation: Mask ventilation without difficulty and Oral airway inserted - appropriate to patient size Laryngoscope Size: Glidescope and 4 Grade View: Grade III Tube type: Oral Tube size: 7.0 mm Number of attempts: 1 Airway Equipment and Method: Stylet and Video-laryngoscopy Secured at: 21 cm Tube secured with: Tape Dental Injury: Teeth and Oropharynx as per pre-operative assessment

## 2016-08-19 NOTE — Anesthesia Preprocedure Evaluation (Addendum)
Anesthesia Evaluation  Patient identified by MRN, date of birth, ID band Patient awake    Reviewed: Allergy & Precautions, NPO status , Patient's Chart, lab work & pertinent test results, reviewed documented beta blocker date and time   History of Anesthesia Complications (+) PONV  Airway Mallampati: II  TM Distance: >3 FB Neck ROM: Full    Dental  (+) Edentulous Upper, Implants, Caps, Dental Advisory Given, Partial Lower   Pulmonary neg pulmonary ROS,    breath sounds clear to auscultation       Cardiovascular hypertension, Pt. on home beta blockers (-) CAD + dysrhythmias (h/o PVCs: controlled with propranolol)  Rhythm:Regular Rate:Normal  '08 stress: No ischemia seen on the scan. Normal gated imaging. Normal ejection fraction. h/o Cath: normal coronaries   Neuro/Psych  Headaches, negative psych ROS   GI/Hepatic Neg liver ROS, GERD  Medicated,  Endo/Other  diabetes (glu 129), Oral Hypoglycemic Agents  Renal/GU negative Renal ROS     Musculoskeletal  (+) Arthritis , Osteoarthritis,    Abdominal   Peds  Hematology negative hematology ROS (+)   Anesthesia Other Findings   Reproductive/Obstetrics                           Anesthesia Physical Anesthesia Plan  ASA: III  Anesthesia Plan: General   Post-op Pain Management:    Induction: Intravenous  Airway Management Planned: Oral ETT and Video Laryngoscope Planned  Additional Equipment:   Intra-op Plan:   Post-operative Plan: Extubation in OR  Informed Consent: I have reviewed the patients History and Physical, chart, labs and discussed the procedure including the risks, benefits and alternatives for the proposed anesthesia with the patient or authorized representative who has indicated his/her understanding and acceptance.   Dental advisory given  Plan Discussed with: CRNA and Surgeon  Anesthesia Plan Comments: (Plan routine  monitors, GETA with VideoGlide intubation)        Anesthesia Quick Evaluation

## 2016-08-19 NOTE — H&P (Signed)
Subjective: The patient is a 71 year old white female who has complained of neck and left greater than right shoulder and arm pain consistent with a cervical radiculopathy. She has failed medical management and was worked up with a cervical MRI. This demonstrated disc degeneration, spondylosis, stenosis, etc. most prominent at C4-5 and C5-6. I discussed the various treatment options with the patient including surgery. She has weighed the risks, benefits, and alternative surgery and decided proceed with a C4-5 and C5-6 anterior cervical discectomy, fusion, and plating.   Past Medical History:  Diagnosis Date  . Anemia   . Anginal pain (HCC)   . Arthritis   . Complication of anesthesia    narrow airway per pt can have a bp drop and hard to wake up  . Diabetes mellitus without complication (HCC)   . Diffuse cystic mastopathy   . Family history of adverse reaction to anesthesia    brother and sister hard to wake up  . GERD (gastroesophageal reflux disease)   . Headache   . Heart murmur   . History of kidney stones   . Hyperlipidemia   . Mitral valve disorder    MVP  . Paroxysmal atrial tachycardia (HCC)   . PONV (postoperative nausea and vomiting)   . PVC's (premature ventricular contractions)   . Varicose veins     Past Surgical History:  Procedure Laterality Date  . ABDOMINAL HYSTERECTOMY    . ANTERIOR AND POSTERIOR VAGINAL REPAIR    . APPENDECTOMY    . BREAST LUMPECTOMY    . CHOLECYSTECTOMY    . ELBOW SURGERY Right   . HERNIA REPAIR      Allergies  Allergen Reactions  . Aspirin   . Codeine   . Latex   . Morphine And Related   . Nsaids   . Statins     Social History  Substance Use Topics  . Smoking status: Never Smoker  . Smokeless tobacco: Never Used  . Alcohol use No    Family History  Problem Relation Age of Onset  . Cancer Father    Prior to Admission medications   Medication Sig Start Date End Date Taking? Authorizing Provider  acetaminophen (TYLENOL) 500  MG tablet Take 500 mg by mouth every 6 (six) hours as needed for mild pain or moderate pain.   Yes [provider]  ibuprofen (ADVIL,MOTRIN) 800 MG tablet Take 400 mg by mouth every 8 (eight) hours as needed for mild pain or moderate pain.   Yes [provider]  lidocaine (LIDODERM) 5 % Place 1 patch onto the skin daily. Remove & Discard patch within 12 hours or as directed by MD   Yes [provider]  metFORMIN (GLUCOPHAGE) 500 MG tablet Take 250 mg by mouth daily. Takes 1/2 tablet daily.    Yes [provider]  Polyethyl Glycol-Propyl Glycol (SYSTANE ULTRA OP) Place 1 drop into both eyes 2 (two) times daily.   Yes [provider]  promethazine (PHENERGAN) 12.5 MG tablet Take 12.5 mg by mouth every 6 (six) hours as needed for nausea.   Yes [provider]  propranolol (INDERAL) 80 MG tablet Take 80 mg by mouth daily.   Yes [provider]  propranolol (INNOPRAN XL) 80 MG 24 hr capsule Take 80 mg by mouth daily.   Yes [provider]  ranitidine (ZANTAC) 300 MG tablet Take 300 mg by mouth at bedtime.   Yes [provider]  traMADol (ULTRAM) 50 MG tablet Take 50-100 mg by  mouth every 6 (six) hours as needed for moderate pain.   Yes [provider]     Review of Systems  Positive ROS: As above  All other systems have been reviewed and were otherwise negative with the exception of those mentioned in the HPI and as above.  Objective: Vital signs in last 24 hours: Temp:  [97.4 F (36.3 C)] 97.4 F (36.3 C) (05/09 0855) Pulse Rate:  [66] 66 (05/09 0855) Resp:  [18] 18 (05/09 0855) BP: (122)/(83) 122/83 (05/09 0855) SpO2:  [100 %] 100 % (05/09 0855) Weight:  [60 kg (132 lb 5 oz)] 60 kg (132 lb 5 oz) (05/09 0855)  General Appearance: Alert Head: Normocephalic, without obvious abnormality, atraumatic Eyes: PERRL, conjunctiva/corneas clear, EOM's intact,    Ears: Normal  Throat: Normal  Neck:  Supple, Back: unremarkable Lungs: Clear to auscultation bilaterally, respirations unlabored Heart: Regular rate and rhythm, no murmur, rub or gallop Abdomen: Soft, non-tender Extremities: Extremities normal, atraumatic, no cyanosis or edema Skin: unremarkable  NEUROLOGIC:   Mental status: alert and oriented,Motor Exam - grossly normal Sensory Exam - grossly normal Reflexes:  Coordination - grossly normal Gait - grossly normal Balance - grossly normal Cranial Nerves: I: smell Not tested  II: visual acuity  OS: Normal  OD: Normal   II: visual fields Full to confrontation  II: pupils Equal, round, reactive to light  III,VII: ptosis None  III,IV,VI: extraocular muscles  Full ROM  V: mastication Normal  V: facial light touch sensation  Normal  V,VII: corneal reflex  Present  VII: facial muscle function - upper  Normal  VII: facial muscle function - lower Normal  VIII: hearing Not tested  IX: soft palate elevation  Normal  IX,X: gag reflex Present  XI: trapezius strength  5/5  XI: sternocleidomastoid strength 5/5  XI: neck flexion strength  5/5  XII: tongue strength  Normal    Data Review Lab Results  Component Value Date   WBC 6.7 08/13/2016   HGB 13.3 08/13/2016   HCT 39.4 08/13/2016   MCV 89.3 08/13/2016   PLT 220 08/13/2016   Lab Results  Component Value Date   NA 139 08/13/2016   K 4.1 08/13/2016   CL 107 08/13/2016   CO2 26 08/13/2016   BUN 19 08/13/2016   CREATININE 1.00 08/13/2016   GLUCOSE 98 08/13/2016   No results found for: INR, PROTIME  Assessment/Plan: 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 I have reviewed her imaging studies with her and pointed out the abnormalities. We have discussed the various treatment options including surgery. I have described the surgical treatment option of the C4-5 and C5-6 anterior cervical  discectomy, fusion, and plating. I have shown her surgical models. We have discussed the risks, benefits, alternatives, expected postoperative course, and likelihood of achieving her goals with surgery. I have answered all her questions. She has decided to proceed with surgery.   Jasmine Farrell D 08/19/2016 10:50 AM

## 2016-08-19 NOTE — Transfer of Care (Signed)
Immediate Anesthesia Transfer of Care Note  Patient: Jasmine Farrell  Procedure(s) Performed: Procedure(s) with comments: ANTERIOR CERVICAL DECOMPRESSION/DISCECTOMY FUSION CERVICAL FOUR- CERVICAL FIVE, CERVICAL FIVE- CERVICAL SIX (N/A) - ANTERIOR CERVICAL DECOMPRESSION/DISCECTOMY FUSION CERVICAL 4- CERVICAL 5, CERVICAL 5- CERVICAL6  Patient Location: PACU  Anesthesia Type:General  Level of Consciousness: awake, alert  and oriented  Airway & Oxygen Therapy: Patient Spontanous Breathing and Patient connected to nasal cannula oxygen  Post-op Assessment: Report given to RN and Post -op Vital signs reviewed and stable  Post vital signs: Reviewed and stable  Last Vitals:  Vitals:   08/19/16 0855  BP: 122/83  Pulse: 66  Resp: 18  Temp: 36.3 C    Last Pain:  Vitals:   08/19/16 0855  TempSrc: Oral  PainSc:       Patients Stated Pain Goal: 2 (08/19/16 0841)  Complications: No apparent anesthesia complications

## 2016-08-19 NOTE — Progress Notes (Signed)
Patient ID: Jasmine Farrell, female   DOB: 1945/07/21, 71 y.o.   MRN: [id number] Subjective:  The patient is alert and pleasant. She complains of nausea.  Objective: Vital signs in last 24 hours: Temp:  [97.4 F (36.3 C)-97.6 F (36.4 C)] 97.6 F (36.4 C) (05/09 1605) Pulse Rate:  [66-90] 81 (05/09 1605) Resp:  [9-18] 14 (05/09 1605) BP: (122-128)/(66-83) 126/75 (05/09 1605) SpO2:  [98 %-100 %] 100 % (05/09 1605) Weight:  [60 kg (132 lb 5 oz)] 60 kg (132 lb 5 oz) (05/09 0855)  Intake/Output from previous day: No intake/output data recorded. Intake/Output this shift: No intake/output data recorded.  Physical exam the patient is alert and pleasant. Her strength is normal in the bilateral deltoid, biceps and lower extremities.  Her dressing is clean and dry. There is no hematoma or shift.  Lab Results: No results for input(s): WBC, HGB, HCT, PLT in the last 72 hours. BMET No results for input(s): NA, K, CL, CO2, GLUCOSE, BUN, CREATININE, CALCIUM in the last 72 hours.  Studies/Results: Dg Cervical Spine 2 Or 3 Views  Result Date: 08/19/2016 CLINICAL DATA:  ACDF C4 through C6 EXAM: CERVICAL SPINE - 2-3 VIEW COMPARISON:  Cervical MRI 11/19/2015 FINDINGS: The first image in the operating room reveals localization of the C4-5 disc space with a needle. The second image demonstrates ACDF at C4-5 and C5-6. Anterior plate and screws in good position. Interbody spacers in good position at C4-5 and C5-6. Sponge is present in the soft tissues anteriorly. IMPRESSION: ACDF C4-5 and C5-6. Electronically Signed   By: Marlan Palauharles  Clark M.D.   On: 08/19/2016 14:15    Assessment/Plan: The patient is doing well except for her nausea. She has when necessary Zofran and Phenergan.  LOS: 0 days     Jasmine Farrell D 08/19/2016, 7:16 PM

## 2016-08-19 NOTE — Op Note (Signed)
Brief history: The patient is a 71 year old white female who has complained of neck and left greater than right shoulder and arm pain consistent with a cervical radiculopathy. She failed medical management and was worked up with a cervical MRI which demonstrated disc degeneration, spondylosis, stenosis, most prominent at C4-5 and C5-6. I discussed the various treatment options with the patient including surgery. She has weighed the risks, benefits, and alternatives to surgery and decided proceed with the C4-5 and C5-6 anterior cervical discectomy, fusion, and plating.  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 Delma OfficerJeff Ericia Moxley  Asst.: Dr. Mikal Planeabell  Anesthesia: Gen. endotracheal  Estimated blood loss: 125 mL  Drains: None  Complications: None  Description of procedure: The patient was brought to the operating room by the anesthesia team. General endotracheal anesthesia was induced. A roll was placed under the patient's shoulders to keep the neck in the neutral position. The patient's anterior cervical region was then prepared with Betadine scrub and Betadine solution. Sterile drapes were applied.  The area to be incised was then injected with Marcaine with epinephrine solution. I then used a scalpel to make a transverse incision in the patient's left anterior neck. I used the Metzenbaum scissors to divide the platysmal muscle and then to dissect medial to the sternocleidomastoid muscle, jugular vein, and carotid artery. I carefully dissected down  towards the anterior cervical spine identifying the esophagus and retracting it medially. Then using Kitner swabs to clear soft tissue from the anterior cervical spine. We then inserted a bent spinal needle into the upper exposed intervertebral disc space. We then obtained intraoperative radiographs confirm our location.  I then used electrocautery to detach the medial border of the longus colli muscle bilaterally from the C4-5 and C5-6 intervertebral disc spaces. I then inserted the Caspar self-retaining retractor underneath the longus colli muscle bilaterally to provide exposure.  We then incised the intervertebral disc at C4-5. We then performed a partial intervertebral discectomy with a pituitary forceps and the Karlin curettes. I then inserted distraction screws into the vertebral bodies at C4 and C5. We then distracted the interspace. We then used the high-speed drill to decorticate the vertebral endplates at C4-5, to drill away the remainder of the intervertebral disc, to drill away some posterior spondylosis, and to thin out the posterior longitudinal ligament. I then incised ligament with the arachnoid knife. We then removed the ligament with a Kerrison punches undercutting the vertebral endplates and decompressing the thecal sac. We then performed foraminotomies about the bilateral C5 nerve roots. This completed the decompression at this level.  We then repeated this procedure in analogous fashion at C5-6 decompressing the thecal sac and the bilateral C6 nerve roots.  We now turned our to attention to the interbody fusion. We used the trial spacers to determine the appropriate size for the interbody prosthesis. We then pre-filled prosthesis with a combination of local morcellized autograft bone that we obtained during decompression as well as Kinnex bone graft extender. We then inserted the prosthesis into the distracted interspace at C4-5 and C5-6. We then removed the distraction screws. There was  a good snug fit of the prosthesis in the interspace.  Having completed the fusion we now turned attention to the anterior spinal instrumentation. We used the high-speed drill to drill away some anterior spondylosis at the disc spaces so that the plate lay down flat. We selected the appropriate length titanium anterior cervical plate. We laid it along the anterior aspect of the vertebral bodies from C4-C6. We then drilled 12 mm holes at C4, C5 and C6. We then secured the plate to the vertebral bodies by placing two 12 mm self-tapping screws at C4, C5 and C6. We then obtained intraoperative radiograph. The demonstrating good position of the instrumentation. We therefore secured the screws the plate the locking each cam. This completed the instrumentation.  We then obtained hemostasis using bipolar electrocautery. We irrigated the wound out with bacitracin solution. We then removed the retractor. We inspected the esophagus for any damage. There was none apparent. We then reapproximated patient's platysmal muscle with interrupted 3-0 Vicryl suture. We then reapproximated the subcutaneous tissue with interrupted 3-0 Vicryl suture. The skin was reapproximated with Steri-Strips and benzoin. The wound was then covered with bacitracin ointment. A sterile dressing was applied. The drapes were removed. Patient was subsequently extubated by the anesthesia team and transported to the post anesthesia care unit in stable condition. All sponge instrument and needle counts were reportedly correct at the end of this case.

## 2016-08-20 ENCOUNTER — Encounter (HOSPITAL_COMMUNITY): Payer: Self-pay | Admitting: Neurosurgery

## 2016-08-20 LAB — GLUCOSE, CAPILLARY
GLUCOSE-CAPILLARY: 221 mg/dL — AB (ref 65–99)
GLUCOSE-CAPILLARY: 209 mg/dL — AB (ref 65–99)
GLUCOSE-CAPILLARY: 165 mg/dL — AB (ref 65–99)
Glucose-Capillary: 160 mg/dL — ABNORMAL HIGH (ref 65–99)

## 2016-08-20 LAB — HEMOGLOBIN A1C
HEMOGLOBIN A1C: 7.7 % — AB (ref 4.8–5.6)
Mean Plasma Glucose: 174 mg/dL

## 2016-08-20 MED FILL — Thrombin For Soln 5000 Unit: CUTANEOUS | Qty: 5000 | Status: AC

## 2016-08-20 NOTE — Anesthesia Postprocedure Evaluation (Signed)
Anesthesia Post Note  Patient: Jasmine Farrell  Procedure(s) Performed: Procedure(s) (LRB): ANTERIOR CERVICAL DECOMPRESSION/DISCECTOMY FUSION CERVICAL FOUR- CERVICAL FIVE, CERVICAL FIVE- CERVICAL SIX (N/A)  Patient location during evaluation: PACU Anesthesia Type: General Level of consciousness: sedated, patient cooperative and oriented Pain management: pain level controlled Vital Signs Assessment: post-procedure vital signs reviewed and stable Respiratory status: spontaneous breathing, nonlabored ventilation, respiratory function stable and patient connected to nasal cannula oxygen Cardiovascular status: blood pressure returned to baseline and stable Postop Assessment: no signs of nausea or vomiting Anesthetic complications: no Comments: Delayed entry, pt eval in PACU       Last Vitals:  Vitals:   08/20/16 0457 08/20/16 0739  BP: 109/69 105/61  Pulse: 65 64  Resp: 18 18  Temp: 36.4 C 36.5 C    Last Pain:  Vitals:   08/20/16 0838  TempSrc:   PainSc: 3                  [name],E. Cathleen Yagi

## 2016-08-20 NOTE — Evaluation (Addendum)
Physical Therapy Evaluation Patient Details Name: Jasmine Farrell MRN: [id number] DOB: [birth year] Today's Date: 08/20/2016   History of Present Illness  Pt is a 70 y/o female who presents s/p C4-C6 ACDF on 08/19/16.   Clinical Impression  Pt admitted with above diagnosis. At the time of PT eval, pt demonstrated a decreased tolerance for functional activity, with decreased balance, and LUE symptoms (weakness, N/T). Pt required heavy min to mod assist at times for balance support during functional mobility. Recommended use of RW for OOB mobility, and OT consult to address LUE deficits and ADL training. Pt will benefit from another PT session prior to d/c for stair training as she was not able to tolerate this session.    Follow Up Recommendations Home health PT;Supervision for mobility/OOB    Equipment Recommendations  3in1 (PT)    Recommendations for Other Services OT consult     Precautions / Restrictions Precautions Precautions: Fall;Cervical Precaution Comments: Reviewed handout with pt in detail. She was also cued for precautions during functional mobility. Required Braces or Orthoses: Cervical Brace Cervical Brace: Soft collar Restrictions Weight Bearing Restrictions: No      Mobility  Bed Mobility Overal bed mobility: Needs Assistance Bed Mobility: Rolling;Sidelying to Sit Rolling: Modified independent (Device/Increase time) Sidelying to sit: Min guard       General bed mobility comments: HOB raised (has an adjustable bed at home), and no use of rails. Pt was able to transition to EOB without physical assist, however reports dizziness upon full sit and min guard was provided for trunk support.   Transfers Overall transfer level: Needs assistance Equipment used: 1 person hand held assist Transfers: Sit to/from Stand Sit to Stand: Min assist         General transfer comment: Assist required for balance support as pt powered-up to full standing position. Pt reports  increased dizziness upon standing and showed decreased safety awareness with attempting to step away from the bed as dizziness was worsening. With 1 minute stand EOB dizzines improved to be able to initiate ambulation.   Ambulation/Gait Ambulation/Gait assistance: Min assist; Mod assist Ambulation Distance (Feet): 60 Feet Assistive device: 1 person hand held assist Gait Pattern/deviations: Step-through pattern;Decreased stride length;Trunk flexed;Narrow base of support Gait velocity: Decreased Gait velocity interpretation: Below normal speed for age/gender General Gait Details: Heavy min assist provided to maintain balance. HHA on one side and railing use required on the other side for BUE support. Feel pt could benefit from a RW next session.   Stairs            Wheelchair Mobility    Modified Rankin (Stroke Patients Only)       Balance Overall balance assessment: Needs assistance Sitting-balance support: Feet supported;No upper extremity supported Sitting balance-Leahy Scale: Poor     Standing balance support: No upper extremity supported;During functional activity Standing balance-Leahy Scale: Poor                               Pertinent Vitals/Pain Pain Assessment: Faces Faces Pain Scale: Hurts little more Pain Location: Shoulders, neck Pain Descriptors / Indicators: Operative site guarding;Aching Pain Intervention(s): Limited activity within patient's tolerance;Monitored during session;Repositioned    Home Living Family/patient expects to be discharged to:: Private residence Living Arrangements: Spouse/significant other Available Help at Discharge: Family;Available 24 hours/day Type of Home: House Home Access: Stairs to enter   [employer] of Steps: 3 Home Layout: One level Home Equipment: Dan Humphreys -  2 wheels;Cane - single point      Prior Function Level of Independence: Independent               Hand Dominance         Extremity/Trunk Assessment   Upper Extremity Assessment Upper Extremity Assessment: RUE deficits/detail RUE Deficits / Details: Pt reports weakness, N/T, pain throughout RUE. Grip strength decreased.     Lower Extremity Assessment Lower Extremity Assessment: Generalized weakness       Communication   Communication: No difficulties  Cognition Arousal/Alertness: Awake/alert Behavior During Therapy: WFL for tasks assessed/performed Overall Cognitive Status: Within Functional Limits for tasks assessed                                        General Comments      Exercises     Assessment/Plan    PT Assessment Patient needs continued PT services  PT Problem List Decreased strength;Decreased range of motion;Decreased activity tolerance;Decreased balance;Decreased mobility;Decreased knowledge of use of DME;Decreased safety awareness;Decreased knowledge of precautions;Pain       PT Treatment Interventions DME instruction;Gait training;Stair training;Functional mobility training;Therapeutic exercise;Therapeutic activities;Neuromuscular re-education;Patient/family education    PT Goals (Current goals can be found in the Care Plan section)  Acute Rehab PT Goals Patient Stated Goal: Home tomorrow PT Goal Formulation: With patient Time For Goal Achievement: 08/27/16 Potential to Achieve Goals: Good    Frequency Min 5X/week   Barriers to discharge        Co-evaluation               AM-PAC PT "6 Clicks" Daily Activity  Outcome Measure Difficulty turning over in bed (including adjusting bedclothes, sheets and blankets)?: None Difficulty moving from lying on back to sitting on the side of the bed? : A Little Difficulty sitting down on and standing up from a chair with arms (e.g., wheelchair, bedside commode, etc,.)?: Total Help needed moving to and from a bed to chair (including a wheelchair)?: A Little Help needed walking in hospital room?: A Little Help  needed climbing 3-5 steps with a railing? : A Lot 6 Click Score: 16    End of Session Equipment Utilized During Treatment: Gait belt;Cervical collar Activity Tolerance: Patient limited by fatigue;Patient limited by pain Patient left: in chair;with call bell/phone within reach Nurse Communication: Mobility status PT Visit Diagnosis: Unsteadiness on feet (R26.81);Pain Pain - Right/Left: Left Pain - part of body: Shoulder;Arm    Time: [id number] PT Time Calculation (min) (ACUTE ONLY): 17 min   Charges:   PT Evaluation $PT Eval Moderate Complexity: 1 Procedure     PT G Codes:        Conni SlipperLaura Delvonte Berenson, PT, DPT Acute Rehabilitation Services Pager: [phone]   Marylynn PearsonLaura D Ryon Layton 08/20/2016, 9:00 AM

## 2016-08-20 NOTE — Progress Notes (Signed)
Patient ID: Jasmine Farrell, female   DOB: December 29, 1945, 71 y.o.   MRN: [id number] Subjective:  The patient is alert and pleasant. She does not feel she is ready to go home.  Objective: Vital signs in last 24 hours: Temp:  [97.5 F (36.4 C)-98.1 F (36.7 C)] 98.1 F (36.7 C) (05/10 1229) Pulse Rate:  [64-92] 67 (05/10 1229) Resp:  [9-18] 16 (05/10 1229) BP: (104-128)/(60-91) 104/60 (05/10 1229) SpO2:  [96 %-100 %] 99 % (05/10 1229)  Intake/Output from previous day: 05/09 0701 - 05/10 0700 In: 1500 [I.V.:1500] Out: 100 [Blood:100] Intake/Output this shift: Total I/O In: 240 [P.O.:240] Out: -   Physical exam the patient is alert and oriented. Her strength is normal in all 4 extremities.  Her dressing is clean and dry. There is no hematoma or shift.  Lab Results: No results for input(s): WBC, HGB, HCT, PLT in the last 72 hours. BMET No results for input(s): NA, K, CL, CO2, GLUCOSE, BUN, CREATININE, CALCIUM in the last 72 hours.  Studies/Results: Dg Cervical Spine 2 Or 3 Views  Result Date: 08/19/2016 CLINICAL DATA:  ACDF C4 through C6 EXAM: CERVICAL SPINE - 2-3 VIEW COMPARISON:  Cervical MRI 11/19/2015 FINDINGS: The first image in the operating room reveals localization of the C4-5 disc space with a needle. The second image demonstrates ACDF at C4-5 and C5-6. Anterior plate and screws in good position. Interbody spacers in good position at C4-5 and C5-6. Sponge is present in the soft tissues anteriorly. IMPRESSION: ACDF C4-5 and C5-6. Electronically Signed   By: Marlan Palauharles  Clark M.D.   On: 08/19/2016 14:15    Assessment/Plan: Postop day #1: She will likely go home tomorrow.  LOS: 1 day     Chaddrick Brue D 08/20/2016, 2:13 PM

## 2016-08-21 LAB — GLUCOSE, CAPILLARY
Glucose-Capillary: 160 mg/dL — ABNORMAL HIGH (ref 65–99)
Glucose-Capillary: 146 mg/dL — ABNORMAL HIGH (ref 65–99)

## 2016-08-21 MED ORDER — OXYCODONE HCL 5 MG PO TABS: 5.0000 mg | ORAL_TABLET | Freq: Four times a day (QID) | ORAL | 0 refills | Status: DC | PRN

## 2016-08-21 MED ORDER — DOCUSATE SODIUM 100 MG PO CAPS: 100.0000 mg | ORAL_CAPSULE | Freq: Two times a day (BID) | ORAL | 0 refills | Status: DC

## 2016-08-21 NOTE — Care Management Note (Addendum)
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 On 08/19/16 patient underwent  ACDF C4-C6.                Action/Plan: Case manager spoke with patient concerning Discharge plan and offer choice for Home Health agency. Patient respectfully declines HH therapy. She says she doesn't feel she needs it and will do the exercises given to her by the therapist, CM informed bedside RN of patient's choice.   Expected Discharge Date:  08/21/16               Expected Discharge Plan:  Home/Self Care  In-House Referral:  NA  Discharge planning Services  CM Consult  Post Acute Care Choice:  Home Health Choice offered to:  Patient  DME Arranged:  N/A DME Agency:  NA  HH Arranged:  Patient Refused HH HH Agency:  NA  Status of Service:  Completed, signed off  If discussed at Long Length of Stay Meetings, dates discussed:    Additional Comments:  Durenda GuthrieBrady, Arlen Dupuis Naomi, RN 08/21/2016, 10:18 AM

## 2016-08-21 NOTE — Progress Notes (Signed)
Patient finally came to pick up patient. Spouse stated he was delayed at previous appointment. Patient ok to go home per MD order.

## 2016-08-21 NOTE — Discharge Summary (Signed)
Physician Discharge Summary  Patient ID: Jasmine Farrell MRN: [id number] DOB/AGE: May 31, 1945 71 y.o.  Admit date: 08/19/2016 Discharge date: 08/21/2016  Admission Diagnoses:C4-5 and C5-6 spondylosis, stenosis, cervicalgia, cervical radiculopathy, cervical myelopathy  Discharge Diagnoses: The same Active Problems:   Cervical spondylosis with myelopathy and radiculopathy   Discharged Condition: good  Hospital Course: I performed a C4-5 and C5-6 anterior cervical discectomy, fusion, and plating on the patient on 08/19/2016. The surgery went well.  The patient's postoperative course was unremarkable. On postoperative day #2 the patient requested discharge to home. She was given written and oral discharge instructions. All her questions were answered.  Consults: None Significant Diagnostic Studies: None Treatments: C4-5 and C5-6 anterior cervical discectomy, fusion, and plating. Discharge Exam: Blood pressure 126/77, pulse 72, temperature 98.3 F (36.8 C), temperature source Oral, resp. rate 18, height 5\' 5"  (1.651 m), weight 60 kg (132 lb 5 oz), SpO2 98 %. The patient is alert and pleasant. Her dressing is clean and dry. There is no hematoma or shift. Her strength is normal in all 4 extremities.  Disposition: Home  Discharge Instructions    Call MD for:  difficulty breathing, headache or visual disturbances    Complete by:  As directed    Call MD for:  extreme fatigue    Complete by:  As directed    Call MD for:  hives    Complete by:  As directed    Call MD for:  persistant dizziness or light-headedness    Complete by:  As directed    Call MD for:  persistant nausea and vomiting    Complete by:  As directed    Call MD for:  redness, tenderness, or signs of infection (pain, swelling, redness, odor or green/yellow discharge around incision site)    Complete by:  As directed    Call MD for:  severe uncontrolled pain    Complete by:  As directed    Call MD for:  temperature  >100.4    Complete by:  As directed    Diet - low sodium heart healthy    Complete by:  As directed    Discharge instructions    Complete by:  As directed    Call [phone] for a followup appointment. Take a stool softener while you are using pain medications.   Driving Restrictions    Complete by:  As directed    Do not drive for 2 weeks.   Increase activity slowly    Complete by:  As directed    Lifting restrictions    Complete by:  As directed    Do not lift more than 5 pounds. No excessive bending or twisting.   May shower / Bathe    Complete by:  As directed    He may shower after the pain she is removed 3 days after surgery. Leave the incision alone.   Remove dressing in 24 hours    Complete by:  As directed      Allergies as of 08/21/2016      Reactions   Dilaudid [hydromorphone] Nausea And Vomiting   Aspirin    Codeine    Latex    Morphine And Related    Nsaids    Statins       Medication List    STOP taking these medications   acetaminophen 500 MG tablet Commonly known as:  TYLENOL   ibuprofen 800 MG tablet Commonly known as:  ADVIL,MOTRIN     TAKE these medications  docusate sodium 100 MG capsule Commonly known as:  COLACE Take 1 capsule (100 mg total) by mouth 2 (two) times daily.   lidocaine 5 % Commonly known as:  LIDODERM Place 1 patch onto the skin daily. Remove & Discard patch within 12 hours or as directed by MD   metFORMIN 500 MG tablet Commonly known as:  GLUCOPHAGE Take 250 mg by mouth daily. Takes 1/2 tablet daily.   oxyCODONE 5 MG immediate release tablet Commonly known as:  Oxy IR/ROXICODONE Take 1 tablet (5 mg total) by mouth every 6 (six) hours as needed for moderate pain (GIVE PATIENT WITH PHENERGAN).   promethazine 12.5 MG tablet Commonly known as:  PHENERGAN Take 12.5 mg by mouth every 6 (six) hours as needed for nausea.   propranolol 80 MG 24 hr capsule Commonly known as:  INNOPRAN XL Take 80 mg by mouth daily.    propranolol 80 MG tablet Commonly known as:  INDERAL Take 80 mg by mouth daily.   ranitidine 300 MG tablet Commonly known as:  ZANTAC Take 300 mg by mouth at bedtime.   SYSTANE ULTRA OP Place 1 drop into both eyes 2 (two) times daily.   traMADol 50 MG tablet Commonly known as:  ULTRAM Take 50-100 mg by mouth every 6 (six) hours as needed for moderate pain.        SignedTressie Stalker: Jasmine Farrell 08/21/2016, 8:09 AM

## 2016-08-21 NOTE — Progress Notes (Signed)
Patient is discharged from room 3C07 at this time. Alert and in stable condition. IV site d/c'd and instructions read to patient and husband with understanding verbalized. Left unit via wheelchair with all belongings at side. 

## 2016-08-21 NOTE — Evaluation (Signed)
Occupational Therapy Evaluation Patient Details Name: Jasmine Farrell MRN: [id number] DOB: 1945-07-02 Today's Date: 08/21/2016    History of Present Illness (P) Pt is a 71 y/o female who presents s/p C4-C6 ACDF on 08/19/16.   Past Medical History:  Diagnosis Date  . Anemia   . Anginal pain (HCC)   . Arthritis   . Complication of anesthesia    narrow airway per pt can have a bp drop and hard to wake up  . Diabetes mellitus without complication (HCC)   . Diffuse cystic mastopathy   . Family history of adverse reaction to anesthesia    brother and sister hard to wake up  . GERD (gastroesophageal reflux disease)   . Headache   . Heart murmur   . History of kidney stones   . Hyperlipidemia   . Mitral valve disorder    MVP  . Paroxysmal atrial tachycardia (HCC)   . PONV (postoperative nausea and vomiting)   . PVC's (premature ventricular contractions)   . Varicose veins       Clinical Impression   Patient is s/p ACDF C4-6 surgery resulting in functional limitations due to the deficits listed below (see OT problem list). PTA was independent with all adls. Pt currently with L hand 4th and 5th digit numbness on the dorsal aspect. Pt with decr hand strength. Educated on hand exercises and table top gliding. Patient will benefit from skilled OT acutely to increase independence and safety with ADLS to allow discharge HHOT.     Follow Up Recommendations  Home health OT    Equipment Recommendations  None recommended by OT    Recommendations for Other Services       Precautions / Restrictions Precautions Precautions: (P) Fall;Cervical Precaution Comments: (P) Reviewed precautions during functional mobility. Overall poor recall and follow-through - throughout cueing, pt states "I know, I know..." while breaking precautions.  Required Braces or Orthoses: (P) Cervical Brace Cervical Brace: (P) Soft collar Restrictions Weight Bearing Restrictions: (P) No      Mobility Bed  Mobility               General bed mobility comments: up in chair on arrival  Transfers Overall transfer level: Needs assistance   Transfers: Sit to/from Stand Sit to Stand: Min guard         General transfer comment: pt reaching for environmental supports    Balance                                           ADL either performed or assessed with clinical judgement   ADL Overall ADL's : Needs assistance/impaired Eating/Feeding: Independent   Grooming: Wash/dry face;Wash/dry hands;Independent                   Toilet Transfer: Supervision/safety           Functional mobility during ADLs: Min guard General ADL Comments: pt needs cues for safety with precautions throughout session. pt reaching out side of base of support. Pt attempting to lift luggage toward herself to retrieve medication. pt verbalizes "i know " then continues to break precautions. pt educated on table top slides. pt reports "Dr Lovell Sheehan told me what they did in therapy before wasnt good for me so i am not going to do any of that stuff"   Pt educated on bathing and avoid washing directly on incision.  Pt educated to use new wash cloth and towel each day. Pt educated to allow water to run across dressing and not to soak in a tub at this time. Cervical precautions ( handout provided): Educated patient on don doff brace with return demonstration, educated on oral care using cups, washing face with cloth, never to wash directly on incision site, avoid neck rotation flexion and extension, positioning with pillows in chair for bil UE, sleeping positioning, avoiding pushing / pulling with bil UE, and fine motor exercises ). Instructed on wall slides FF 90 degrees and table top slides.  Pt educated on need to notify doctor / RN of swallowing changes or choking..   .  Vision Baseline Vision/History: No visual deficits       Perception     Praxis      Pertinent Vitals/Pain Pain  Assessment: (P) Faces Faces Pain Scale: Hurts little more Pain Location: Shoulders, neck Pain Descriptors / Indicators: Operative site guarding;Aching Pain Intervention(s): Monitored during session;Premedicated before session;Repositioned;Ice applied     Hand Dominance Right   Extremity/Trunk Assessment Upper Extremity Assessment Upper Extremity Assessment: RUE deficits/detail;LUE deficits/detail RUE Deficits / Details: reports incr pain in R Ue this AM but using it WFL LUE Deficits / Details: 2 out 5 MMT but functionally able to pincher grasp to have R hand pull open hand santizer wipes. pt demonstrates grip strength greater than 2 out 5 MMT with functional task. pt reports numbness on the dorsal aspect of 4th and 5th digits only.pt unable to complete pad to pad on the thumb to 5th digit without pain but demonstrates with cutting pancakes this AM.     Lower Extremity Assessment Lower Extremity Assessment: Generalized weakness   Cervical / Trunk Assessment Cervical / Trunk Assessment: Other exceptions Cervical / Trunk Exceptions: soft collar   Communication Communication Communication: No difficulties   Cognition Arousal/Alertness: Awake/alert Behavior During Therapy: WFL for tasks assessed/performed Overall Cognitive Status: Within Functional Limits for tasks assessed                                     General Comments       Exercises     Shoulder Instructions      Home Living Family/patient expects to be discharged to:: Private residence Living Arrangements: Spouse/significant other Available Help at Discharge: Family;Available 24 hours/day Type of Home: House Home Access: Stairs to enter Entergy CorporationEntrance Stairs-Number of Steps: 3   Home Layout: One level     Bathroom Shower/Tub: Chief Strategy OfficerTub/shower unit   Bathroom Toilet: Standard Bathroom Accessibility: Yes   Home Equipment: Environmental consultantWalker - 2 wheels;Cane - single point          Prior Functioning/Environment  Level of Independence: Independent                 OT Problem List:        OT Treatment/Interventions:      OT Goals(Current goals can be found in the care plan section) Acute Rehab OT Goals Patient Stated Goal: (P) Home today  OT Frequency:     Barriers to D/C:            Co-evaluation              AM-PAC PT "6 Clicks" Daily Activity     Outcome Measure Help from another person eating meals?: None Help from another person taking care of personal grooming?: None Help from another person  toileting, which includes using toliet, bedpan, or urinal?: None Help from another person bathing (including washing, rinsing, drying)?: None Help from another person to put on and taking off regular upper body clothing?: None Help from another person to put on and taking off regular lower body clothing?: None 6 Click Score: 24   End of Session Nurse Communication: Mobility status;Precautions  Activity Tolerance: Patient tolerated treatment well Patient left: in chair;with call bell/phone within reach  OT Visit Diagnosis: Unsteadiness on feet (R26.81)                Time: 4098-1191 OT Time Calculation (min): 10 min Charges:  OT General Charges $OT Visit: 1 Procedure OT Evaluation $OT Eval Low Complexity: 1 Procedure G-Codes:      Mateo Flow   OTR/L Pager: [phone] Office: [phone] .   Boone Master B 08/21/2016, 8:54 AM

## 2016-08-21 NOTE — Progress Notes (Signed)
Physical Therapy Treatment Patient Details Name: Jasmine Farrell MRN: [id number] DOB: [birth year] Today's Date: 08/21/2016    History of Present Illness Pt is a 71 y/o female who presents s/p C4-C6 ACDF on 08/19/16.     PT Comments    Pt progressing towards physical therapy goals. Was able to perform transfers and ambulation with min assist for balance support and safety. Pt with poor recall and follow-through with precautions, stating "I know, I know..." when actively breaking precautions and being cued to correct. Continue to feel that pt will benefit from HHPT to progress safe mobility and independence at home. Will continue to follow.    Follow Up Recommendations  Home health PT;Supervision for mobility/OOB     Equipment Recommendations  3in1 (PT)    Recommendations for Other Services OT consult     Precautions / Restrictions Precautions Precautions: Fall;Cervical Precaution Comments: Reviewed precautions during functional mobility. Overall poor recall and follow-through - throughout cueing, pt states "I know, I know..." while breaking precautions.  Required Braces or Orthoses: Cervical Brace Cervical Brace: Soft collar Restrictions Weight Bearing Restrictions: No    Mobility  Bed Mobility Overal bed mobility: Needs Assistance Bed Mobility: Rolling;Sidelying to Sit Rolling: Modified independent (Device/Increase time) Sidelying to sit: Min guard       General bed mobility comments: HOB raised (has an adjustable bed at home), and no use of rails. Pt was able to transition to EOB without physical assist, however reports dizziness upon full sit and min guard was provided for trunk support.   Transfers Overall transfer level: Needs assistance Equipment used: Rolling walker (2 wheeled) Transfers: Sit to/from Stand Sit to Stand: Min assist         General transfer comment: Hands-on guarding as pt powered up to full standing position. Pt did not require assist for rise,  however min assist provided for posterior LOB once standing.   Ambulation/Gait Ambulation/Gait assistance: Min assist Ambulation Distance (Feet): 220 Feet Assistive device: Rolling walker (2 wheeled) Gait Pattern/deviations: Step-through pattern;Decreased stride length;Trunk flexed;Narrow base of support Gait velocity: Decreased Gait velocity interpretation: Below normal speed for age/gender General Gait Details: Frequent assist provided for LOB and correction with the RW. Pt stepping out of the walker or twisting around the walker to reach for items/doors/etc. Pt not making corrective changes with cues for safety - just states "I know, I know..."   Stairs Stairs: Yes   Stair Management: One rail Right;Alternating pattern;Step to pattern;Forwards Number of Stairs: 10 General stair comments: Pt was cued for step-to pattern however performed alternating step pattern with increased pain in back/neck due to poor posture. Ascending, pt required min guard assist, and descending, pt required min assist for support during step-down.   Wheelchair Mobility    Modified Rankin (Stroke Patients Only)       Balance Overall balance assessment: Needs assistance Sitting-balance support: Feet supported;No upper extremity supported Sitting balance-Leahy Scale: Poor     Standing balance support: No upper extremity supported;During functional activity Standing balance-Leahy Scale: Poor                              Cognition Arousal/Alertness: Awake/alert Behavior During Therapy: WFL for tasks assessed/performed Overall Cognitive Status: Within Functional Limits for tasks assessed  Exercises      General Comments        Pertinent Vitals/Pain Pain Assessment: Faces Faces Pain Scale: Hurts even more Pain Location: B shoulders, R>L Pain Descriptors / Indicators: Operative site guarding;Aching Pain Intervention(s): Limited  activity within patient's tolerance;Monitored during session;Repositioned    Home Living Family/patient expects to be discharged to:: Private residence Living Arrangements: Spouse/significant other Available Help at Discharge: Family;Available 24 hours/day Type of Home: House Home Access: Stairs to enter   Home Layout: One level Home Equipment: Environmental consultantWalker - 2 wheels;Cane - single point      Prior Function Level of Independence: Independent          PT Goals (current goals can now be found in the care plan section) Acute Rehab PT Goals Patient Stated Goal: Home today PT Goal Formulation: With patient Time For Goal Achievement: 08/27/16 Potential to Achieve Goals: Good Progress towards PT goals: Progressing toward goals    Frequency    Min 5X/week      PT Plan Current plan remains appropriate    Co-evaluation              AM-PAC PT "6 Clicks" Daily Activity  Outcome Measure  Difficulty turning over in bed (including adjusting bedclothes, sheets and blankets)?: None Difficulty moving from lying on back to sitting on the side of the bed? : A Little Difficulty sitting down on and standing up from a chair with arms (e.g., wheelchair, bedside commode, etc,.)?: Total Help needed moving to and from a bed to chair (including a wheelchair)?: A Little Help needed walking in hospital room?: A Little Help needed climbing 3-5 steps with a railing? : A Lot 6 Click Score: 16    End of Session Equipment Utilized During Treatment: Gait belt;Cervical collar Activity Tolerance: Patient limited by fatigue;Patient limited by pain Patient left: in chair;with call bell/phone within reach Nurse Communication: Mobility status PT Visit Diagnosis: Unsteadiness on feet (R26.81);Pain Pain - Right/Left: Left Pain - part of body: Shoulder;Arm     Time: [id number] PT Time Calculation (min) (ACUTE ONLY): 18 min  Charges:  $Gait Training: 8-22 mins                    G Codes:        Conni SlipperLaura Sheray Grist, PT, DPT Acute Rehabilitation Services Pager: [phone]    Jasmine PearsonLaura D Ehan Freas 08/21/2016, 9:03 AM

## 2016-09-25 ENCOUNTER — Emergency Department (HOSPITAL_COMMUNITY): Payer: Medicare Other

## 2016-09-25 ENCOUNTER — Emergency Department (HOSPITAL_COMMUNITY)
Admission: EM | Admit: 2016-09-25 | Discharge: 2016-09-26 | Disposition: A | Discharge status: Home / Self Care | Payer: Medicare Other | Attending: Emergency Medicine | Admitting: Emergency Medicine

## 2016-09-25 ENCOUNTER — Encounter (HOSPITAL_COMMUNITY): Payer: Self-pay

## 2016-09-25 DIAGNOSIS — Y92007 Garden or yard of unspecified non-institutional (private) residence as the place of occurrence of the external cause: Secondary | ICD-10-CM | POA: Diagnosis not present

## 2016-09-25 DIAGNOSIS — M542 Cervicalgia: Secondary | ICD-10-CM | POA: Diagnosis present

## 2016-09-25 DIAGNOSIS — Z79899 Other long term (current) drug therapy: Secondary | ICD-10-CM | POA: Diagnosis not present

## 2016-09-25 DIAGNOSIS — S42291A Other displaced fracture of upper end of right humerus, initial encounter for closed fracture: Secondary | ICD-10-CM | POA: Insufficient documentation

## 2016-09-25 DIAGNOSIS — Y93K9 Activity, other involving animal care: Secondary | ICD-10-CM | POA: Insufficient documentation

## 2016-09-25 DIAGNOSIS — R51 Headache: Secondary | ICD-10-CM | POA: Diagnosis not present

## 2016-09-25 DIAGNOSIS — Y999 Unspecified external cause status: Secondary | ICD-10-CM | POA: Insufficient documentation

## 2016-09-25 DIAGNOSIS — E119 Type 2 diabetes mellitus without complications: Secondary | ICD-10-CM | POA: Insufficient documentation

## 2016-09-25 DIAGNOSIS — W010XXA Fall on same level from slipping, tripping and stumbling without subsequent striking against object, initial encounter: Secondary | ICD-10-CM | POA: Diagnosis not present

## 2016-09-25 DIAGNOSIS — W19XXXA Unspecified fall, initial encounter: Secondary | ICD-10-CM

## 2016-09-25 DIAGNOSIS — M79622 Pain in left upper arm: Secondary | ICD-10-CM | POA: Diagnosis not present

## 2016-09-25 DIAGNOSIS — Z7984 Long term (current) use of oral hypoglycemic drugs: Secondary | ICD-10-CM | POA: Insufficient documentation

## 2016-09-25 LAB — CBC WITH DIFFERENTIAL/PLATELET
BASOS ABS: 0 10*3/uL (ref 0.0–0.1)
BASOS PCT: 1 %
EOS ABS: 0.4 10*3/uL (ref 0.0–0.7)
EOS PCT: 4 %
HEMATOCRIT: 35.3 % — AB (ref 36.0–46.0)
Hemoglobin: 12 g/dL (ref 12.0–15.0)
Lymphocytes Relative: 25 %
Lymphs Abs: 2 10*3/uL (ref 0.7–4.0)
MCH: 29.9 pg (ref 26.0–34.0)
MCHC: 34 g/dL (ref 30.0–36.0)
MCV: 88 fL (ref 78.0–100.0)
MONO ABS: 0.7 10*3/uL (ref 0.1–1.0)
MONOS PCT: 8 %
NEUTROS ABS: 5 10*3/uL (ref 1.7–7.7)
Neutrophils Relative %: 62 %
PLATELETS: 228 10*3/uL (ref 150–400)
RBC: 4.01 MIL/uL (ref 3.87–5.11)
RDW: 13.1 % (ref 11.5–15.5)
WBC: 8 10*3/uL (ref 4.0–10.5)

## 2016-09-25 LAB — PROTIME-INR
INR: 1.03
PROTHROMBIN TIME: 13.6 s (ref 11.4–15.2)

## 2016-09-25 MED ORDER — HYDROMORPHONE HCL 1 MG/ML IJ SOLN
0.5000 mg | Freq: Once | INTRAMUSCULAR | Status: AC
  Administered 2016-09-25: 0.5 mg via INTRAVENOUS
  Filled 2016-09-25: qty 1

## 2016-09-25 MED ORDER — ONDANSETRON HCL 4 MG/2ML IJ SOLN
4.0000 mg | Freq: Once | INTRAMUSCULAR | Status: AC
  Administered 2016-09-25: 4 mg via INTRAVENOUS
  Filled 2016-09-25: qty 2

## 2016-09-25 NOTE — ED Triage Notes (Signed)
Pt arrived via [facility] from home after unwitnessed fall outside.  Pt reports recent neck surgery and presents with pre existing neck splint.  EMS gave 100 mcg fentanyl.

## 2016-09-25 NOTE — ED Provider Notes (Signed)
[facility]-EMERGENCY DEPT Provider Note   CSN: [id number] Arrival date & time: 09/25/16  2050     History   Chief Complaint Chief Complaint  Patient presents with  . Fall    HPI Jasmine Farrell is a 71 y.o. female.  HPI  She is a 71 year old female with past history significant for recent anterior cervical discectomy C4-5, C5-6 on (08/19/16), who presents to the emergency Department following a fall. Patient states that she was trying to get her dog back in the yard when she tripped on a root on the ground. Landed on the left side of her face and arm. Patient did have loss of consciousness for approximately 1 minute. Patient was unable to ambulate secondary to pain but did crawl to the back door in order to get her husband's attention. Patient currently complaining of severe pain to her left neck, left arm with tingling sensation to her left arm. Received IV pain medication with EMS without improvement of her pain. Denies change in vision, nausea, vomiting, chest pain, abdominal pain, pain in her right upper or bilateral lower extremities.   Past Medical History:  Diagnosis Date  . Anemia   . Anginal pain (HCC)   . Arthritis   . Complication of anesthesia    narrow airway per pt can have a bp drop and hard to wake up  . Diabetes mellitus without complication (HCC)   . Diffuse cystic mastopathy   . Family history of adverse reaction to anesthesia    brother and sister hard to wake up  . GERD (gastroesophageal reflux disease)   . Headache   . Heart murmur   . History of kidney stones   . Hyperlipidemia   . Mitral valve disorder    MVP  . Paroxysmal atrial tachycardia (HCC)   . PONV (postoperative nausea and vomiting)   . PVC's (premature ventricular contractions)   . Varicose veins     Patient Active Problem List   Diagnosis Date Noted  . Cervical spondylosis with myelopathy and radiculopathy 08/19/2016  . Spider veins 02/08/2013  . Varicose veins of lower extremities with  other complications 01/30/2013    Past Surgical History:  Procedure Laterality Date  . ABDOMINAL HYSTERECTOMY    . ANTERIOR AND POSTERIOR VAGINAL REPAIR    . ANTERIOR CERVICAL DECOMP/DISCECTOMY FUSION N/A 08/19/2016   Procedure: ANTERIOR CERVICAL DECOMPRESSION/DISCECTOMY FUSION CERVICAL FOUR- CERVICAL FIVE, CERVICAL FIVE- CERVICAL SIX;  Surgeon: Tressie Stalker, MD;  Location: [facility] OR;  Service: Neurosurgery;  Laterality: N/A;  ANTERIOR CERVICAL DECOMPRESSION/DISCECTOMY FUSION CERVICAL 4- CERVICAL 5, CERVICAL 5- CERVICAL6  . APPENDECTOMY    . BREAST LUMPECTOMY    . CHOLECYSTECTOMY    . ELBOW SURGERY Right   . HERNIA REPAIR      OB History    No data available       Home Medications    Prior to Admission medications   Medication Sig Start Date End Date Taking? Authorizing Provider  docusate sodium (COLACE) 100 MG capsule Take 1 capsule (100 mg total) by mouth 2 (two) times daily. 08/21/16   Tressie Stalker, MD  lidocaine (LIDODERM) 5 % Place 1 patch onto the skin daily. Remove & Discard patch within 12 hours or as directed by MD    [provider]  metFORMIN (GLUCOPHAGE) 500 MG tablet Take 250 mg by mouth daily. Takes 1/2 tablet daily.     [provider]  oxyCODONE (ROXICODONE) 5 MG immediate release tablet Take 1-2 tablets (5-10 mg total) by  mouth every 4 (four) hours as needed for severe pain. 09/26/16 09/29/16  Corena HerterMumma, Marnae Madani, MD  Polyethyl Glycol-Propyl Glycol (SYSTANE ULTRA OP) Place 1 drop into both eyes 2 (two) times daily.    [provider]  promethazine (PHENERGAN) 12.5 MG tablet Take 12.5 mg by mouth every 6 (six) hours as needed for nausea.    [provider]  propranolol (INDERAL) 80 MG tablet Take 80 mg by mouth daily.    [provider]  propranolol (INNOPRAN XL) 80 MG 24 hr capsule Take 80 mg by mouth daily.    [provider]  ranitidine (ZANTAC) 300 MG tablet Take 300 mg by mouth at bedtime.    [provider]  traMADol (ULTRAM) 50 MG tablet Take 50-100 mg by mouth every 6 (six) hours as needed for moderate pain.    [provider]    Family History Family History  Problem Relation Age of Onset  . Cancer Father     Social History Social History  Substance Use Topics  . Smoking status: Never Smoker  . Smokeless tobacco: Never Used  . Alcohol use No     Allergies   Dilaudid [hydromorphone]; Aspirin; Codeine; Latex; Morphine and related; Nsaids; and Statins   Review of Systems Review of Systems  Constitutional: Negative for appetite change and fever.  HENT: Negative for congestion and trouble swallowing.   Respiratory: Negative for cough, chest tightness and shortness of breath.   Cardiovascular: Negative for chest pain.  Gastrointestinal: Negative for abdominal pain, diarrhea and vomiting.  Genitourinary: Negative for flank pain and hematuria.  Musculoskeletal: Positive for neck pain. Negative for back pain.  Skin: Negative for rash.  Neurological: Positive for syncope, numbness and headaches. Negative for dizziness, seizures, weakness and light-headedness.  Psychiatric/Behavioral: Negative for behavioral problems.     Physical Exam Updated Vital Signs BP 112/69   Pulse 81   Temp 98 F (36.7 C) (Oral)   Resp 18   SpO2 97%   Physical Exam  Constitutional: She is oriented to person, place, and time. She appears well-developed and well-nourished. She appears distressed.  HENT:  Abrasion to the left cheek. No facial instability. No nasal bridge deviation. No septal hematoma. No dental abnormality.  Eyes: Conjunctivae and EOM are normal. Pupils are equal, round, and reactive to light.  Neck:  Soft cervical collar in place.  Cardiovascular: Normal rate, regular rhythm, normal heart sounds and intact distal pulses.   No murmur heard. Pulmonary/Chest: Effort normal and breath sounds normal. No respiratory distress. She exhibits no tenderness.  Abdominal: She  exhibits no distension and no mass. There is no tenderness. There is no guarding.  Musculoskeletal: She exhibits deformity.  Tenderness to palpation of the left humerus, motor function intact, sensation intact. +2 radial pulse. Decreased range of motion of the left shoulder and elbow secondary to pain. Normal range of motion of the left wrist. No tenderness to the left clavicle.  Neurological: She is alert and oriented to person, place, and time. No cranial nerve deficit or sensory deficit. GCS eye subscore is 4. GCS verbal subscore is 5. GCS motor subscore is 6.  Skin: Skin is warm. No pallor.  Psychiatric: She has a normal mood and affect.     ED Treatments / Results  Labs (all labs ordered are listed, but only abnormal results are displayed) Labs Reviewed  CBC WITH DIFFERENTIAL/PLATELET - Abnormal; Notable for the following:       Result Value   HCT 35.3 (*)  All other components within normal limits  COMPREHENSIVE METABOLIC PANEL - Abnormal; Notable for the following:    Sodium 131 (*)    CO2 20 (*)    Glucose, Bld 145 (*)    All other components within normal limits  CBG MONITORING, ED - Abnormal; Notable for the following:    Glucose-Capillary 177 (*)    All other components within normal limits  PROTIME-INR    EKG  EKG Interpretation None       Radiology Dg Chest 1 View  Result Date: 09/25/2016 CLINICAL DATA:  Fall today EXAM: CHEST 1 VIEW COMPARISON:  02/15/2009 FINDINGS: Lower cervical hardware.  Metallic safety pin over the right neck. Linear atelectasis at the bases. No pleural effusion. Normal cardiomediastinal silhouette. No pneumothorax. Proximal left humerus fracture. IMPRESSION: 1. Linear atelectasis at the lung bases. No acute infiltrate or edema. 2. Safety pin over the right neck soft tissues for which clinical correlation is recommended. Electronically Signed   By: Jasmine Pang M.D.   On: 09/25/2016 22:52   Dg Elbow Complete Left  Result Date:  09/26/2016 CLINICAL DATA:  Status post fall, with left elbow pain. Initial encounter. EXAM: LEFT ELBOW - COMPLETE 3+ VIEW COMPARISON:  None. FINDINGS: There is no evidence of fracture or dislocation. The visualized joint spaces are preserved. No significant joint effusion is identified. The soft tissues are unremarkable in appearance. IMPRESSION: No evidence of fracture or dislocation. Electronically Signed   By: Roanna Raider M.D.   On: 09/26/2016 00:25   Ct Head Wo Contrast  Result Date: 09/26/2016 CLINICAL DATA:  Unwitnessed fall recent neck surgery EXAM: CT HEAD WITHOUT CONTRAST CT CERVICAL SPINE WITHOUT CONTRAST TECHNIQUE: Multidetector CT imaging of the head and cervical spine was performed following the standard protocol without intravenous contrast. Multiplanar CT image reconstructions of the cervical spine were also generated. COMPARISON:  09/15/2016, MRI 11/19/2015, CT 10/04/2011 FINDINGS: CT HEAD FINDINGS Brain: Atypical scan plane. No acute territorial infarction, hemorrhage, or intracranial mass is seen. Mildly prominent extra-axial CSF spaces anteriorly are unchanged. Mild atrophy. Stable ventricle size. Vascular: No hyperdense vessels.  No unexpected calcifications. Skull: No depressed skull fracture. Sinuses/Orbits: Minimal nasal bone deformity on the left unchanged. Linear lucency at the floor at the inferior left maxillary sinus also unchanged. Other: None CT CERVICAL SPINE FINDINGS Alignment: Straightening of the cervical spine. Facet alignment within normal limits. Skull base and vertebrae: Craniovertebral junction is intact. There is no fracture visualized. Soft tissues and spinal canal: Some soft tissue fullness of the prevertebral soft tissues. No visible canal hematoma. Disc levels: Status post anterior plate and screw fixation from C4 through C6 with interbody disc devices. Upper chest: Lung apices are clear. Possible tiny nodules in the right lobe of the thyroid. Other: None  IMPRESSION: 1. No CT evidence for acute intracranial abnormality. 2. Straightening of the cervical spine with evidence of prior surgical plate and screw fixation from C4 through C6. No acute fracture. Mild enlargement of the prevertebral soft tissues likely related to resolving postsurgical changes . Electronically Signed   By: Jasmine Pang M.D.   On: 09/26/2016 00:25   Ct Cervical Spine Wo Contrast  Result Date: 09/26/2016 CLINICAL DATA:  Unwitnessed fall recent neck surgery EXAM: CT HEAD WITHOUT CONTRAST CT CERVICAL SPINE WITHOUT CONTRAST TECHNIQUE: Multidetector CT imaging of the head and cervical spine was performed following the standard protocol without intravenous contrast. Multiplanar CT image reconstructions of the cervical spine were also generated. COMPARISON:  09/15/2016, MRI 11/19/2015, CT 10/04/2011 FINDINGS:  CT HEAD FINDINGS Brain: Atypical scan plane. No acute territorial infarction, hemorrhage, or intracranial mass is seen. Mildly prominent extra-axial CSF spaces anteriorly are unchanged. Mild atrophy. Stable ventricle size. Vascular: No hyperdense vessels.  No unexpected calcifications. Skull: No depressed skull fracture. Sinuses/Orbits: Minimal nasal bone deformity on the left unchanged. Linear lucency at the floor at the inferior left maxillary sinus also unchanged. Other: None CT CERVICAL SPINE FINDINGS Alignment: Straightening of the cervical spine. Facet alignment within normal limits. Skull base and vertebrae: Craniovertebral junction is intact. There is no fracture visualized. Soft tissues and spinal canal: Some soft tissue fullness of the prevertebral soft tissues. No visible canal hematoma. Disc levels: Status post anterior plate and screw fixation from C4 through C6 with interbody disc devices. Upper chest: Lung apices are clear. Possible tiny nodules in the right lobe of the thyroid. Other: None IMPRESSION: 1. No CT evidence for acute intracranial abnormality. 2. Straightening of  the cervical spine with evidence of prior surgical plate and screw fixation from C4 through C6. No acute fracture. Mild enlargement of the prevertebral soft tissues likely related to resolving postsurgical changes . Electronically Signed   By: Jasmine Pang M.D.   On: 09/26/2016 00:25   Dg Shoulder Left  Result Date: 09/25/2016 CLINICAL DATA:  Status post unwitnessed fall, with severe left shoulder pain. Initial encounter. EXAM: LEFT SHOULDER - 2+ VIEW COMPARISON:  Left shoulder MRI performed 03/12/2016, and left shoulder radiographs performed 03/10/2016 FINDINGS: There is a comminuted fracture of the left humeral head and neck, with minimal displacement and mild angulation at the humeral neck. This is compatible with a Neer 1 part fracture. The left humeral head remains seated at the glenoid fossa. The left acromioclavicular joint is unremarkable. The visualized portions of the left lung are clear. Soft tissue swelling is noted about the left shoulder. IMPRESSION: Comminuted fracture of the left humeral head and neck, with minimal displacement and mild angulation at the left humeral neck. This is compatible with a Neer 1 part fracture. Electronically Signed   By: Roanna Raider M.D.   On: 09/25/2016 22:20   Dg Humerus Left  Result Date: 09/25/2016 CLINICAL DATA:  Larey Seat today with pain EXAM: LEFT HUMERUS - 2+ VIEW COMPARISON:  09/25/2016 FINDINGS: Examination is limited by positioning. Slightly impacted humeral head and neck fracture again demonstrated. Mild valgus angulation and minimal displacement. Distal it humerus appears intact allowing for positioning. IMPRESSION: Suboptimal study secondary to positioning. Mildly displaced and angulated proximal humerus fracture. Electronically Signed   By: Jasmine Pang M.D.   On: 09/25/2016 22:50    Procedures Procedures (including critical care time)  Medications Ordered in ED Medications  HYDROmorphone (DILAUDID) injection 0.5 mg (0.5 mg Intravenous Given  09/25/16 2246)  ondansetron (ZOFRAN) injection 4 mg (4 mg Intravenous Given 09/25/16 2246)  HYDROmorphone (DILAUDID) injection 0.5 mg (0.5 mg Intravenous Given 09/26/16 0054)  ondansetron (ZOFRAN) injection 4 mg (4 mg Intravenous Given 09/26/16 0051)     Initial Impression / Assessment and Plan / ED Course  I have reviewed the triage vital signs and the nursing notes.  Pertinent labs & imaging results that were available during my care of the patient were reviewed by me and considered in my medical decision making (see chart for details).    Patient is a 71 year old female with past medical history significant for recent cervical discectomy, who presents to the emergency Department following a mechanical fall with pain to her left neck and left upper extremity. ABCs intact. GCS  15. No midline cervical tenderness to palpation. Obvious deformity to left upper arm, neurovascularly intact.  CT head, C-spine, x-ray of the left upper arm and chest x-ray obtained. CT imaging showed no acute findings. X-ray showed a left proximal humerus fracture with mild angulation. Patient with some tingling to her fingertips, but sensation intact. Continues to be neurovascularly intact. Sling applied. Given IV pain medication and antiemetics.   Patient stable for discharge home. Patient given a prescription for pain medication, discuss scheduling Tylenol. Given follow-up instructions to see orthopedics as an outpatient. Discussed follow-up with primary care physician. Given strict return precautions to the emergency department. Patient expressed understanding, no questions or concerns at time of discharge.  When attempting to ambulate the patient, patient became lightheaded. Hypotensive upon standing. Blood glucose within normal limits. Given 1 L IV fluid. Hypotension likely secondary to IV narcotics and orthostatics given that she was laying down. Repeat blood pressure 125/90.   Final Clinical Impressions(s) / ED  Diagnoses   Final diagnoses:  Other closed displaced fracture of proximal end of right humerus, initial encounter    New Prescriptions New Prescriptions   OXYCODONE (ROXICODONE) 5 MG IMMEDIATE RELEASE TABLET    Take 1-2 tablets (5-10 mg total) by mouth every 4 (four) hours as needed for severe pain.     Corena Herter, MD 09/26/16 1914    Corena Herter, MD 09/26/16 7829    Doug Sou, MD 09/29/16 [phone]

## 2016-09-25 NOTE — ED Notes (Signed)
Patient transported to X-ray 

## 2016-09-26 DIAGNOSIS — S42291A Other displaced fracture of upper end of right humerus, initial encounter for closed fracture: Secondary | ICD-10-CM | POA: Diagnosis not present

## 2016-09-26 LAB — COMPREHENSIVE METABOLIC PANEL
ALK PHOS: 75 U/L (ref 38–126)
ALT: 17 U/L (ref 14–54)
AST: 20 U/L (ref 15–41)
Albumin: 3.6 g/dL (ref 3.5–5.0)
Anion gap: 8 (ref 5–15)
BUN: 12 mg/dL (ref 6–20)
CALCIUM: 9 mg/dL (ref 8.9–10.3)
CO2: 20 mmol/L — AB (ref 22–32)
CREATININE: 0.91 mg/dL (ref 0.44–1.00)
Chloride: 103 mmol/L (ref 101–111)
Glucose, Bld: 145 mg/dL — ABNORMAL HIGH (ref 65–99)
Potassium: 3.7 mmol/L (ref 3.5–5.1)
Sodium: 131 mmol/L — ABNORMAL LOW (ref 135–145)
Total Bilirubin: 0.9 mg/dL (ref 0.3–1.2)
Total Protein: 6.9 g/dL (ref 6.5–8.1)

## 2016-09-26 LAB — CBG MONITORING, ED: GLUCOSE-CAPILLARY: 177 mg/dL — AB (ref 65–99)

## 2016-09-26 MED ORDER — HYDROMORPHONE HCL 1 MG/ML IJ SOLN
0.5000 mg | Freq: Once | INTRAMUSCULAR | Status: AC
  Administered 2016-09-26: 0.5 mg via INTRAVENOUS
  Filled 2016-09-26: qty 1

## 2016-09-26 MED ORDER — ONDANSETRON HCL 4 MG/2ML IJ SOLN
4.0000 mg | Freq: Once | INTRAMUSCULAR | Status: AC
  Administered 2016-09-26: 4 mg via INTRAVENOUS
  Filled 2016-09-26: qty 2

## 2016-09-26 MED ORDER — OXYCODONE HCL 5 MG PO TABS: 5.0000 mg | ORAL_TABLET | ORAL | 0 refills | Status: AC | PRN

## 2016-09-26 NOTE — ED Provider Notes (Signed)
Patient tripped and fell over tree root complains of left shoulder pain and neck pain as well as facial pain since the event. Patient is alert Glasgow Coma Score 15 HEENT exam. Abrasions to the face but no appreciable soft tissue swelling or deformity. Neck is in soft cervical collar. Left upper extremity skin is intact. She is exquisitely tender over the proximal upper arm. No deformity. Radial pulse 2+. All other extremities without deformity or swelling or tenderness, neurovascularly intact. X-rays viewed by me   Doug SouJacubowitz, Cloie Wooden, MD 09/26/16 [id number]

## 2016-09-26 NOTE — ED Notes (Signed)
Pt states she was feeling better and able to walk to wheel chair without assistance.  States she was no longer dizzy or having vision changes.  Husband at bedside insisting to go.

## 2016-09-26 NOTE — ED Notes (Signed)
Pt sat up quickly in bed to put on house coat in order to leave, states she was feeling a little lightheaded after last dilaudid dose and was seeing a few black spots in her vision. Pt sat back in bed.

## 2016-09-26 NOTE — ED Notes (Signed)
Pt stable, ambulatory, states understanding of discharge instructions 

## 2016-09-29 DIAGNOSIS — S42202A Unspecified fracture of upper end of left humerus, initial encounter for closed fracture: Secondary | ICD-10-CM | POA: Insufficient documentation

## 2016-09-29 HISTORY — DX: Unspecified fracture of upper end of left humerus, initial encounter for closed fracture: S42.202A

## 2017-04-27 DIAGNOSIS — S42295D Other nondisplaced fracture of upper end of left humerus, subsequent encounter for fracture with routine healing: Secondary | ICD-10-CM | POA: Insufficient documentation

## 2017-04-27 HISTORY — DX: Other nondisplaced fracture of upper end of left humerus, subsequent encounter for fracture with routine healing: S42.295D

## 2017-09-15 DIAGNOSIS — S61012A Laceration without foreign body of left thumb without damage to nail, initial encounter: Secondary | ICD-10-CM | POA: Insufficient documentation

## 2017-09-15 HISTORY — DX: Laceration without foreign body of left thumb without damage to nail, initial encounter: S61.012A

## 2017-10-28 DIAGNOSIS — L749 Eccrine sweat disorder, unspecified: Secondary | ICD-10-CM

## 2017-10-28 HISTORY — DX: Eccrine sweat disorder, unspecified: L74.9

## 2017-11-10 DIAGNOSIS — W57XXXA Bitten or stung by nonvenomous insect and other nonvenomous arthropods, initial encounter: Secondary | ICD-10-CM

## 2017-11-10 HISTORY — DX: Bitten or stung by nonvenomous insect and other nonvenomous arthropods, initial encounter: W57.XXXA

## 2017-12-16 DIAGNOSIS — R61 Generalized hyperhidrosis: Secondary | ICD-10-CM | POA: Insufficient documentation

## 2017-12-16 HISTORY — DX: Generalized hyperhidrosis: R61

## 2017-12-28 ENCOUNTER — Encounter: Payer: Self-pay | Admitting: Gastroenterology

## 2017-12-28 ENCOUNTER — Ambulatory Visit (INDEPENDENT_AMBULATORY_CARE_PROVIDER_SITE_OTHER): Payer: Medicare Other | Admitting: Gastroenterology

## 2017-12-28 VITALS — BP 120/80 | HR 64 | Ht 64.0 in | Wt 130.4 lb

## 2017-12-28 DIAGNOSIS — K581 Irritable bowel syndrome with constipation: Secondary | ICD-10-CM

## 2017-12-28 DIAGNOSIS — R1013 Epigastric pain: Secondary | ICD-10-CM | POA: Diagnosis not present

## 2017-12-28 DIAGNOSIS — K449 Diaphragmatic hernia without obstruction or gangrene: Secondary | ICD-10-CM

## 2017-12-28 NOTE — Progress Notes (Signed)
Chief Complaint: Epigastric pain  Referring Provider:  Leane Call, PA-C      ASSESSMENT AND PLAN;   #1. GERD with HH #2. Epigastric pain. (s/p chole for biliary dyskinesia). H/O gastric ulcer 2004. Nl CBC, CMP 07/2017 #3. IBS with constipation - neg cologuard 2017, neg colon 2001 Dr Victorino Dike (advised not to get it done by Dr Victorino Dike as she had very redundant colon, high risk for perforation).  Plan: -Zantac 300mg  po qd to continue. -EGD for further evaluation -[place] complete abdomen -Increase water intake. -Restart exercising. -CT scan of the abdomen and pelvis if the above work-up is negative and continued problems.    HPI:    Jasmine Farrell is a 72 y.o. 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 Victorino Dike With occasional epigastric pain, postprandial, minimal heartburn, no odynophagia or dysphagia Intermittent, nonradiating, mild Has long-standing history of constipation, better with increased water intake Husband recently had a car wreck-hence patient has not been exercising as she is taking care of him. Some abdominal bloating No weight loss.  In fact he has gained weight. Denies use of nonsteroidals No fever or chills. No nausea or vomiting. No melena or hematochezia.   Past Medical History:  Diagnosis Date  . Anemia   . Anginal pain (HCC)   . Arthritis   . Complication of anesthesia    narrow airway per pt can have a bp drop and hard to wake up  . Diabetes mellitus without complication (HCC)   . Diffuse cystic mastopathy   . Family history of adverse reaction to anesthesia    brother and sister hard to wake up  . GERD (gastroesophageal reflux disease)   . Headache   . Heart murmur   . History of kidney stones   . Hyperlipidemia   . Mitral valve disorder    MVP  . Paroxysmal atrial tachycardia (HCC)   . PONV (postoperative nausea and vomiting)   . PVC's (premature ventricular contractions)   . Varicose veins     Past Surgical  History:  Procedure Laterality Date  . ABDOMINAL HYSTERECTOMY    . ANTERIOR AND POSTERIOR VAGINAL REPAIR    . ANTERIOR CERVICAL DECOMP/DISCECTOMY FUSION N/A 08/19/2016   Procedure: ANTERIOR CERVICAL DECOMPRESSION/DISCECTOMY FUSION CERVICAL FOUR- CERVICAL FIVE, CERVICAL FIVE- CERVICAL SIX;  Surgeon: Tressie Stalker, MD;  Location: [facility] OR;  Service: Neurosurgery;  Laterality: N/A;  ANTERIOR CERVICAL DECOMPRESSION/DISCECTOMY FUSION CERVICAL 4- CERVICAL 5, CERVICAL 5- CERVICAL6  . APPENDECTOMY    . BREAST LUMPECTOMY    . CHOLECYSTECTOMY    . ELBOW SURGERY Right   . HERNIA REPAIR      Family History  Problem Relation Age of Onset  . Cancer Father     Social History   Tobacco Use  . Smoking status: Never Smoker  . Smokeless tobacco: Never Used  Substance Use Topics  . Alcohol use: No  . Drug use: No    Current Outpatient Medications  Medication Sig Dispense Refill  . docusate sodium (COLACE) 100 MG capsule Take 1 capsule (100 mg total) by mouth 2 (two) times daily. 60 capsule 0  . lidocaine (LIDODERM) 5 % Place 1 patch onto the skin daily. Remove & Discard patch within 12 hours or as directed by MD    . metFORMIN (GLUCOPHAGE) 500 MG tablet Take 250 mg by mouth daily. Takes 1/2 tablet daily.     . metFORMIN (GLUCOPHAGE) 500 MG tablet Take 500 mg by mouth at bedtime.    Marland Kitchen  Misc Natural Products (OSTEO BI-FLEX ADV TRIPLE ST PO) Take by mouth daily.    Bertram Gala Glycol-Propyl Glycol (SYSTANE ULTRA OP) Place 1 drop into both eyes 2 (two) times daily.    . Probiotic Product (PROBIOTIC PO) Take by mouth daily.    . promethazine (PHENERGAN) 12.5 MG tablet Take 12.5 mg by mouth every 6 (six) hours as needed for nausea.    . propranolol (INDERAL) 80 MG tablet Take 80 mg by mouth daily.    . propranolol (INNOPRAN XL) 80 MG 24 hr capsule Take 80 mg by mouth daily.    . ranitidine (ZANTAC) 300 MG tablet Take 300 mg by mouth at bedtime.    . vitamin C (ASCORBIC ACID) 500 MG tablet Take 500 mg by  mouth daily.     No current facility-administered medications for this visit.     Allergies  Allergen Reactions  . Dilaudid [Hydromorphone] Nausea And Vomiting  . Aspirin   . Codeine   . Latex   . Morphine And Related   . Nsaids   . Statins     Review of Systems:  Constitutional: Denies fever, chills, diaphoresis, appetite change and fatigue.  HEENT: Denies photophobia, eye pain, redness, hearing loss, ear pain, congestion, sore throat, rhinorrhea, sneezing, mouth sores, neck pain, neck stiffness and tinnitus.   Respiratory: Denies SOB, DOE, cough, chest tightness,  and wheezing.   Cardiovascular: Denies chest pain, palpitations and leg swelling.  Genitourinary: Denies dysuria, urgency, frequency, hematuria, flank pain and difficulty urinating.  Musculoskeletal: Denies myalgias, back pain, joint swelling, arthralgias and gait problem.  Skin: No rash.  Neurological: Denies dizziness, seizures, syncope, weakness, light-headedness, numbness and headaches.  Hematological: Denies adenopathy. Easy bruising, personal or family bleeding history  Psychiatric/Behavioral: No anxiety or depression     Physical Exam:    BP 120/80   Pulse 64   Ht 5\' 4"  (1.626 m)   Wt 130 lb 6 oz (59.1 kg)   BMI 22.38 kg/m  Filed Weights   12/28/17 1528  Weight: 130 lb 6 oz (59.1 kg)   Constitutional:  Well-developed, in no acute distress. Psychiatric: Normal mood and affect. Behavior is normal. HEENT: Pupils normal.  Conjunctivae are normal. No scleral icterus. Neck supple.  Cardiovascular: Normal rate, regular rhythm. No edema Pulmonary/chest: Effort normal and breath sounds normal. No wheezing, rales or rhonchi. Abdominal: Soft, nondistended.  Well-healed surgical scars, mild epigastric tenderness.. Bowel sounds active throughout. There are no masses palpable. No hepatomegaly. Rectal:  defered Neurological: Alert and oriented to person place and time. Skin: Skin is warm and dry. No rashes  noted.  Data Reviewed: I have personally reviewed following labs and imaging studies  CBC: CBC Latest Ref Rng & Units 09/25/2016 08/13/2016  WBC 4.0 - 10.5 K/uL 8.0 6.7  Hemoglobin 12.0 - 15.0 g/dL 82.9 56.2  Hematocrit 13.0 - 46.0 % 35.3(L) 39.4  Platelets 150 - 400 K/uL 228 220    CMP: CMP Latest Ref Rng & Units 09/25/2016 08/13/2016  Glucose 65 - 99 mg/dL 865(H) 98  BUN 6 - 20 mg/dL 12 19  Creatinine 8.46 - 1.00 mg/dL 9.62 9.52  Sodium 841 - 145 mmol/L 131(L) 139  Potassium 3.5 - 5.1 mmol/L 3.7 4.1  Chloride 101 - 111 mmol/L 103 107  CO2 22 - 32 mmol/L 20(L) 26  Calcium 8.9 - 10.3 mg/dL 9.0 9.6  Total Protein 6.5 - 8.1 g/dL 6.9 -  Total Bilirubin 0.3 - 1.2 mg/dL 0.9 -  Alkaline Phos 38 -  126 U/L 75 -  AST 15 - 41 U/L 20 -  ALT 14 - 54 U/L 17 -  Seen in presence of Woodroe Moderisha Williams CMA Labs and notes were reviewed from care everywhere.     Edman Circleaj Joanny Dupree, MD 12/28/2017, 3:38 PM  Cc: Leane CallNodal, Jr Reinaldo, PA-C

## 2017-12-28 NOTE — Patient Instructions (Addendum)
If you are age 73 or older, your body mass index should be between 23-30. Your Body mass index is 22.38 kg/m. If this is out of the aforementioned range listed, please consider follow up with your Primary Care Provider.  If you are age 73 or younger, your body mass index should be between 19-25. Your Body mass index is 22.38 kg/m. If this is out of the aformentioned range listed, please consider follow up with your Primary Care Provider.   You have been scheduled for an endoscopy. Please follow written instructions given to you at your visit today. If you use inhalers (even only as needed), please bring them with you on the day of your procedure. Your physician has requested that you go to www.startemmi.com and enter the access code given to you at your visit today. This web site gives a general overview about your procedure. However, you should still follow specific instructions given to you by our office regarding your preparation for the procedure.    You have been scheduled [facility] an abdominal [facility] (1st floor [address] ) on 01/04/18 at 9am. Please arrive 15 minutes prior to your appointment for registration. Make certain not to have anything to eat or drink 6 hours prior to your appointment. Should you need to reschedule your appointment, please contact radiology at [phone]. This test typically takes about 30 minutes to perform.   Thank you,  Dr. Lynann Bolognaajesh Gupta

## 2018-01-04 ENCOUNTER — Ambulatory Visit (HOSPITAL_BASED_OUTPATIENT_CLINIC_OR_DEPARTMENT_OTHER)
Admission: RE | Admit: 2018-01-04 | Discharge: 2018-01-04 | Disposition: A | Discharge status: Home / Self Care | Payer: Medicare Other | Source: Ambulatory Visit | Attending: Gastroenterology | Admitting: Gastroenterology

## 2018-01-04 DIAGNOSIS — R1013 Epigastric pain: Secondary | ICD-10-CM | POA: Diagnosis not present

## 2018-01-25 ENCOUNTER — Encounter: Payer: Self-pay | Admitting: Gastroenterology

## 2018-01-25 ENCOUNTER — Ambulatory Visit (AMBULATORY_SURGERY_CENTER): Payer: Medicare Other | Admitting: Gastroenterology

## 2018-01-25 ENCOUNTER — Other Ambulatory Visit: Payer: Self-pay | Admitting: Gastroenterology

## 2018-01-25 VITALS — BP 131/75 | HR 81 | Temp 98.9°F | Resp 17 | Ht 64.0 in | Wt 130.0 lb

## 2018-01-25 DIAGNOSIS — R1013 Epigastric pain: Secondary | ICD-10-CM

## 2018-01-25 DIAGNOSIS — K2970 Gastritis, unspecified, without bleeding: Secondary | ICD-10-CM | POA: Diagnosis present

## 2018-01-25 DIAGNOSIS — K259 Gastric ulcer, unspecified as acute or chronic, without hemorrhage or perforation: Secondary | ICD-10-CM | POA: Diagnosis not present

## 2018-01-25 MED ORDER — PANTOPRAZOLE SODIUM 40 MG PO TBEC: 40.0000 mg | DELAYED_RELEASE_TABLET | Freq: Every day | ORAL | 0 refills | Status: DC

## 2018-01-25 MED ORDER — SODIUM CHLORIDE 0.9 % IV SOLN: 500.0000 mL | Freq: Once | INTRAVENOUS | Status: DC

## 2018-01-25 NOTE — Op Note (Signed)
[facility] Patient Name: Jasmine Farrell Procedure Date: 01/25/2018 3:12 PM MRN: [id number] Endoscopist: Lynann Bologna , MD Age: 72 Referring MD:  Date of Birth: 17-Sep-1945 Gender: Female Account #: [id number] Procedure:                Upper GI endoscopy Indications:              Epigastric abdominal pain Medicines:                Monitored Anesthesia Care Procedure:                Pre-Anesthesia Assessment:                           - Prior to the procedure, a History and Physical                            was performed, and patient medications and                            allergies were reviewed. The patient's tolerance of                            previous anesthesia was also reviewed. The risks                            and benefits of the procedure and the sedation                            options and risks were discussed with the patient.                            All questions were answered, and informed consent                            was obtained. Prior Anticoagulants: The patient has                            taken no previous anticoagulant or antiplatelet                            agents. ASA Grade Assessment: II - A patient with                            mild systemic disease. After reviewing the risks                            and benefits, the patient was deemed in                            satisfactory condition to undergo the procedure.                           After obtaining informed consent, the endoscope was  passed under direct vision. Throughout the                            procedure, the patient's blood pressure, pulse, and                            oxygen saturations were monitored continuously. The                            Model GIF-[id number] scope was introduced                            through the mouth, and advanced to the second part                            of duodenum. The upper GI  endoscopy was                            accomplished without difficulty. The patient                            tolerated the procedure well. Scope In: Scope Out: Findings:                 One non-bleeding superficial gastric ulcer with no                            stigmata of bleeding was found in the gastric                            antrum. The lesion was 6 mm in largest dimension.                            Biopsies were taken with a cold forceps for                            histology. Estimated blood loss was minimal.                           The examined duodenum was normal. Biopsies for                            histology were taken with a cold forceps for                            evaluation of celiac disease. Estimated blood loss:                            none. Complications:            No immediate complications. Estimated Blood Loss:     Estimated blood loss: none. Impression:               - Non-bleeding gastric ulcer with no stigmata of  bleeding. Biopsied.                           - Normal examined duodenum. Biopsied. Recommendation:           - Patient has a contact number available for                            emergencies. The signs and symptoms of potential                            delayed complications were discussed with the                            patient. Return to normal activities tomorrow.                            Written discharge instructions were provided to the                            patient.                           - Resume previous diet.                           - Protonix 40 mg p.o. once a day for 8 weeks, then                            can resume Zantac                           - No ibuprofen, naproxen, or other non-steroidal                            anti-inflammatory drugs.                           - Await pathology results.                           - Return to GI clinic in 12 weeks. Lynann Bologna, MD 01/25/2018 3:29:31 PM This report has been signed electronically.

## 2018-01-25 NOTE — Patient Instructions (Signed)
Take protonix 40mg  everyday for 8 weeks then go back on Zantac. No NSAIDS such as Ibuprofen, naproxen, aspirin    YOU HAD AN ENDOSCOPIC PROCEDURE TODAY AT THE [facility]:   Refer to the procedure report that was given to you for any specific questions about what was found during the examination.  If the procedure report does not answer your questions, please call your gastroenterologist to clarify.  If you requested that your care partner not be given the details of your procedure findings, then the procedure report has been included in a sealed envelope for you to review at your convenience later.  YOU SHOULD EXPECT: Some feelings of bloating in the abdomen. Passage of more gas than usual.  Walking can help get rid of the air that was put into your GI tract during the procedure and reduce the bloating. If you had a lower endoscopy (such as a colonoscopy or flexible sigmoidoscopy) you may notice spotting of blood in your stool or on the toilet paper. If you underwent a bowel prep for your procedure, you may not have a normal bowel movement for a few days.  Please Note:  You might notice some irritation and congestion in your nose or some drainage.  This is from the oxygen used during your procedure.  There is no need for concern and it should clear up in a day or so.  SYMPTOMS TO REPORT IMMEDIATELY:    Following upper endoscopy (EGD)  Vomiting of blood or coffee ground material  New chest pain or pain under the shoulder blades  Painful or persistently difficult swallowing  New shortness of breath  Fever of 100F or higher  Black, tarry-looking stools  For urgent or emergent issues, a gastroenterologist can be reached at any hour by calling [phone].   DIET:  We do recommend a small meal at first, but then you may proceed to your regular diet.  Drink plenty of fluids but you should avoid alcoholic beverages for 24 hours.  ACTIVITY:  You should plan to take it easy for  the rest of today and you should NOT DRIVE or use heavy machinery until tomorrow (because of the sedation medicines used during the test).    FOLLOW UP: Our staff will call the number listed on your records the next business day following your procedure to check on you and address any questions or concerns that you may have regarding the information given to you following your procedure. If we do not reach you, we will leave a message.  However, if you are feeling well and you are not experiencing any problems, there is no need to return our call.  We will assume that you have returned to your regular daily activities without incident.  If any biopsies were taken you will be contacted by phone or by letter within the next 1-3 weeks.  Please call us at [phone] if you have not heard about the biopsies in 3 weeks.    SIGNATURES/CONFIDENTIALITY: You and/or your care partner have signed paperwork which will be entered into your electronic medical record.  These signatures attest to the fact that that the information above on your After Visit Summary has been reviewed and is understood.  Full responsibility of the confidentiality of this discharge information lies with you and/or your care-partner.

## 2018-01-25 NOTE — Progress Notes (Signed)
Report to PACU, RN, vss, BBS= Clear.  

## 2018-01-25 NOTE — Progress Notes (Signed)
Called to room to assist during endoscopic procedure.  Patient ID and intended procedure confirmed with present staff. Received instructions for my participation in the procedure from the performing physician.  

## 2018-01-26 ENCOUNTER — Telehealth: Payer: Self-pay | Admitting: *Deleted

## 2018-01-26 NOTE — Telephone Encounter (Signed)
  Follow up Call-  Call back number 01/25/2018  Post procedure Call Back phone  # [phone]  Permission to leave phone message Yes  Some recent data might be hidden     Patient questions:  Do you have a fever, pain , or abdominal swelling? No. Pain Score  0 *  Have you tolerated food without any problems? Yes.    Have you been able to return to your normal activities? Yes.    Do you have any questions about your discharge instructions: Diet   No. Medications  No. Follow up visit  No.  Do you have questions or concerns about your Care? No.  Actions: * If pain score is 4 or above: No action needed, pain <4.

## 2018-02-01 ENCOUNTER — Encounter: Payer: Self-pay | Admitting: Gastroenterology

## 2018-02-25 ENCOUNTER — Telehealth: Payer: Self-pay | Admitting: Gastroenterology

## 2018-02-25 NOTE — Telephone Encounter (Signed)
Patient reports that she has blisters on and around her month since her upper endoscopy on 01/25/18.  She has a latex allergy and she is wondering if there is possibly latex in the bite block that was used. I confirmed with Cottie BandaKrsten Westbrook, RN that [facility]  She reports that her lips burn and she has new blisters that have come out today.    She is asking if there is anything that can be prescribed.  I advised I would send to Dr. Chales AbrahamsGupta, but she is encouraged to please see her dermatologist as well.

## 2018-02-26 NOTE — Telephone Encounter (Signed)
Lets try 2% viscus lidocaine - apply directly to the oral ulcers 3 times a day x 2 weeks.  Please call in 30 cc, 2 refills If not better in 2 weeks, let us know Good idea to see dermatologist/dentist

## 2018-02-28 ENCOUNTER — Other Ambulatory Visit: Payer: Self-pay

## 2018-02-28 DIAGNOSIS — K121 Other forms of stomatitis: Secondary | ICD-10-CM

## 2018-02-28 MED ORDER — LIDOCAINE VISCOUS HCL 2 % MT SOLN: 30.0000 mL | Freq: Three times a day (TID) | OROMUCOSAL | 2 refills | Status: DC

## 2018-02-28 NOTE — Telephone Encounter (Signed)
Called and spoke with patient- pt reports the "blisters are now on the inside and outside of her mouth, they are itching and burning aand have gotten a lot bigger since my procedure where I had that bite block in"; patient advised of MD recommendations and the Rx being sent to her elected pharmacy; patient instructed on Rx frequency, verbalized understanding of instructions; patient advised to call back to office if questions/concerns arise;  Patient reported she is going to call her dermatologist/dentist to see if there was a diagnosis for this issue;

## 2018-05-05 ENCOUNTER — Encounter (HOSPITAL_COMMUNITY): Payer: Self-pay

## 2018-05-05 ENCOUNTER — Emergency Department (HOSPITAL_COMMUNITY): Payer: Medicare HMO

## 2018-05-05 ENCOUNTER — Other Ambulatory Visit: Payer: Self-pay

## 2018-05-05 ENCOUNTER — Emergency Department (HOSPITAL_COMMUNITY)
Admission: EM | Admit: 2018-05-05 | Discharge: 2018-05-05 | Disposition: A | Discharge status: Home / Self Care | Payer: Medicare HMO | Attending: Emergency Medicine | Admitting: Emergency Medicine

## 2018-05-05 DIAGNOSIS — E785 Hyperlipidemia, unspecified: Secondary | ICD-10-CM | POA: Insufficient documentation

## 2018-05-05 DIAGNOSIS — Z79899 Other long term (current) drug therapy: Secondary | ICD-10-CM | POA: Diagnosis not present

## 2018-05-05 DIAGNOSIS — R1013 Epigastric pain: Secondary | ICD-10-CM

## 2018-05-05 DIAGNOSIS — E119 Type 2 diabetes mellitus without complications: Secondary | ICD-10-CM | POA: Insufficient documentation

## 2018-05-05 DIAGNOSIS — K529 Noninfective gastroenteritis and colitis, unspecified: Secondary | ICD-10-CM | POA: Diagnosis not present

## 2018-05-05 DIAGNOSIS — R55 Syncope and collapse: Secondary | ICD-10-CM | POA: Diagnosis not present

## 2018-05-05 DIAGNOSIS — Z91040 Latex allergy status: Secondary | ICD-10-CM | POA: Diagnosis not present

## 2018-05-05 DIAGNOSIS — Z7984 Long term (current) use of oral hypoglycemic drugs: Secondary | ICD-10-CM | POA: Diagnosis not present

## 2018-05-05 DIAGNOSIS — R1033 Periumbilical pain: Secondary | ICD-10-CM

## 2018-05-05 LAB — CBC WITH DIFFERENTIAL/PLATELET
Abs Immature Granulocytes: 0.04 10*3/uL (ref 0.00–0.07)
BASOS PCT: 1 %
Basophils Absolute: 0.1 10*3/uL (ref 0.0–0.1)
EOS ABS: 0.6 10*3/uL — AB (ref 0.0–0.5)
Eosinophils Relative: 5 %
HCT: 42.8 % (ref 36.0–46.0)
Hemoglobin: 14 g/dL (ref 12.0–15.0)
Immature Granulocytes: 0 %
Lymphocytes Relative: 15 %
Lymphs Abs: 1.8 10*3/uL (ref 0.7–4.0)
MCH: 29.6 pg (ref 26.0–34.0)
MCHC: 32.7 g/dL (ref 30.0–36.0)
MCV: 90.5 fL (ref 80.0–100.0)
MONO ABS: 1.5 10*3/uL — AB (ref 0.1–1.0)
MONOS PCT: 13 %
NEUTROS PCT: 66 %
Neutro Abs: 7.7 10*3/uL (ref 1.7–7.7)
PLATELETS: 231 10*3/uL (ref 150–400)
RBC: 4.73 MIL/uL (ref 3.87–5.11)
RDW: 12.6 % (ref 11.5–15.5)
WBC: 11.6 10*3/uL — ABNORMAL HIGH (ref 4.0–10.5)
nRBC: 0 % (ref 0.0–0.2)

## 2018-05-05 LAB — COMPREHENSIVE METABOLIC PANEL
ALT: 20 U/L (ref 0–44)
AST: 21 U/L (ref 15–41)
Albumin: 4.2 g/dL (ref 3.5–5.0)
Alkaline Phosphatase: 73 U/L (ref 38–126)
Anion gap: 11 (ref 5–15)
BUN: 17 mg/dL (ref 8–23)
CHLORIDE: 100 mmol/L (ref 98–111)
CO2: 24 mmol/L (ref 22–32)
CREATININE: 1.37 mg/dL — AB (ref 0.44–1.00)
Calcium: 10.3 mg/dL (ref 8.9–10.3)
GFR calc Af Amer: 44 mL/min — ABNORMAL LOW (ref >60)
GFR calc non Af Amer: 38 mL/min — ABNORMAL LOW (ref >60)
Glucose, Bld: 188 mg/dL — ABNORMAL HIGH (ref 70–99)
Potassium: 4.5 mmol/L (ref 3.5–5.1)
Sodium: 135 mmol/L (ref 135–145)
Total Bilirubin: 1.1 mg/dL (ref 0.3–1.2)
Total Protein: 7.8 g/dL (ref 6.5–8.1)

## 2018-05-05 LAB — CBG MONITORING, ED: Glucose-Capillary: 222 mg/dL — ABNORMAL HIGH (ref 70–99)

## 2018-05-05 LAB — LIPASE, BLOOD: Lipase: 62 U/L — ABNORMAL HIGH (ref 11–51)

## 2018-05-05 LAB — C DIFFICILE QUICK SCREEN W PCR REFLEX
C Diff antigen: NEGATIVE
C Diff interpretation: NOT DETECTED
C Diff toxin: NEGATIVE

## 2018-05-05 LAB — I-STAT TROPONIN, ED: Troponin i, poc: 0 ng/mL (ref 0.00–0.08)

## 2018-05-05 MED ORDER — ACETAMINOPHEN 325 MG PO TABS
650.0000 mg | ORAL_TABLET | Freq: Once | ORAL | Status: AC
  Administered 2018-05-05: 650 mg via ORAL
  Filled 2018-05-05: qty 2

## 2018-05-05 MED ORDER — PANTOPRAZOLE SODIUM 40 MG IV SOLR
40.0000 mg | Freq: Once | INTRAVENOUS | Status: AC
  Administered 2018-05-05: 40 mg via INTRAVENOUS
  Filled 2018-05-05: qty 40

## 2018-05-05 MED ORDER — METRONIDAZOLE 500 MG PO TABS: 500.0000 mg | ORAL_TABLET | Freq: Three times a day (TID) | ORAL | 0 refills | Status: DC

## 2018-05-05 MED ORDER — ALUM & MAG HYDROXIDE-SIMETH 200-200-20 MG/5ML PO SUSP
30.0000 mL | Freq: Once | ORAL | Status: AC
  Administered 2018-05-05: 30 mL via ORAL
  Filled 2018-05-05: qty 30

## 2018-05-05 MED ORDER — PROMETHAZINE HCL 25 MG/ML IJ SOLN
12.5000 mg | Freq: Once | INTRAMUSCULAR | Status: AC
  Administered 2018-05-05: 12.5 mg via INTRAVENOUS
  Filled 2018-05-05: qty 1

## 2018-05-05 MED ORDER — CIPROFLOXACIN HCL 500 MG PO TABS: 500.0000 mg | ORAL_TABLET | Freq: Two times a day (BID) | ORAL | 0 refills | Status: DC

## 2018-05-05 MED ORDER — PANTOPRAZOLE SODIUM 40 MG PO TBEC: 40.0000 mg | DELAYED_RELEASE_TABLET | Freq: Every day | ORAL | 0 refills | Status: DC

## 2018-05-05 MED ORDER — SODIUM CHLORIDE 0.9 % IV BOLUS
1000.0000 mL | Freq: Once | INTRAVENOUS | Status: AC
  Administered 2018-05-05: 1000 mL via INTRAVENOUS

## 2018-05-05 MED ORDER — IOHEXOL 300 MG/ML  SOLN
100.0000 mL | Freq: Once | INTRAMUSCULAR | Status: AC | PRN
  Administered 2018-05-05: 80 mL via INTRAVENOUS

## 2018-05-05 NOTE — ED Triage Notes (Signed)
[facility]- pt coming from restaurant with complaint of weakness and epigastric pain. Pt was initially very weak and diaphoretic and thought could be a blood sugar issue. 12 lead unremarkable with EMS. Pt does report abdominal cramping and nausea which improved with 4mg  of zofran.   138/90 HR 65 SPO2 100% RR 20 CBG 291

## 2018-05-05 NOTE — ED Provider Notes (Signed)
[facility] EMERGENCY DEPARTMENT Provider Note   CSN: [id number] Arrival date & time: 05/05/18  1319     History   Chief Complaint Chief Complaint  Patient presents with  . Abdominal Pain  . Weakness    HPI Jasmine Farrell is a 73 y.o. female who presents with near syncope and epigastric abdominal pain.  Past medical history significant for GERD, IBS, history of gastric ulcers, history of SVT, hypertension, diabetes.  The patient states that she did not sleep well last night.  She was in her usual state of health and ate oatmeal without any problems.  She went with her daughter to [employer] and felt like she had to have a bowel movement when she was there.  She states she has been constipated recently and has had a rectocele repair in the past and therefore sometimes she has difficulty having bowel movements.  She started to become lightheaded, diaphoretic, blurry vision, nausea and weakness while on the toilet.  Every time she tried to stand up she felt like she was going to pass out.  Her daughter came to check on her because she was in the bathroom for 30 minutes.  She tried to drink something because she thought that maybe her blood sugar was low.  Every time she tried to stand up she could not walk therefore EMS was called.  When they arrived her blood sugar was 300.  Her blood pressure was elevated and EKG was unremarkable.  Patient is also having epigastric abdominal pain which is been a long standing issue for her.  She has seen Dr. Chales Abrahams in the past and had a recent EGD which showed superficial gastric ulcers and she was treated with a PPI for 2 months.  She denies fever, chills, vomiting, urinary symptoms.  She still feels nauseous.  She has not had any blood in the stool.  Past surgical history significant for cholecystectomy, appendectomy, partial hysterectomy, hernia repair.  HPI  Past Medical History:  Diagnosis Date  . Anemia   . Anginal pain (HCC)   .  Arthritis   . Complication of anesthesia    narrow airway per pt can have a bp drop and hard to wake up  . Diabetes mellitus without complication (HCC)   . Diffuse cystic mastopathy   . Family history of adverse reaction to anesthesia    brother and sister hard to wake up  . GERD (gastroesophageal reflux disease)   . Headache   . Heart murmur   . History of kidney stones   . Hyperlipidemia   . Mitral valve disorder    MVP  . Paroxysmal atrial tachycardia (HCC)   . PONV (postoperative nausea and vomiting)   . PSVT (paroxysmal supraventricular tachycardia) (HCC)   . PVC's (premature ventricular contractions)   . SVT (supraventricular tachycardia) (HCC)   . Varicose veins     Patient Active Problem List   Diagnosis Date Noted  . Cervical spondylosis with myelopathy and radiculopathy 08/19/2016  . Spider veins 02/08/2013  . Varicose veins of lower extremities with other complications 01/30/2013    Past Surgical History:  Procedure Laterality Date  . ABDOMINAL HYSTERECTOMY    . ANTERIOR AND POSTERIOR VAGINAL REPAIR    . ANTERIOR CERVICAL DECOMP/DISCECTOMY FUSION N/A 08/19/2016   Procedure: ANTERIOR CERVICAL DECOMPRESSION/DISCECTOMY FUSION CERVICAL FOUR- CERVICAL FIVE, CERVICAL FIVE- CERVICAL SIX;  Surgeon: Tressie Stalker, MD;  Location: [facility] OR;  Service: Neurosurgery;  Laterality: N/A;  ANTERIOR CERVICAL DECOMPRESSION/DISCECTOMY FUSION CERVICAL 4-  CERVICAL 5, CERVICAL 5- CERVICAL6  . APPENDECTOMY    . BREAST LUMPECTOMY    . CHOLECYSTECTOMY    . ELBOW SURGERY Right   . HERNIA REPAIR       OB History   No obstetric history on file.      Home Medications    Prior to Admission medications   Medication Sig Start Date End Date Taking? Authorizing Provider  acetaminophen (TYLENOL) 500 MG tablet Take by mouth.    [provider]  co-enzyme Q-10 30 MG capsule Take 30 mg by mouth 3 (three) times daily.    [provider]  Cyanocobalamin (B-12) 2500 MCG TABS  Take by mouth.    [provider]  docusate sodium (COLACE) 100 MG capsule Take 1 capsule (100 mg total) by mouth 2 (two) times daily. Patient not taking: Reported on 01/25/2018 08/21/16   Tressie StalkerJenkins, Jeffrey, MD  Glucosamine-Chondroitin-MSM [phone] MG TABS Take by mouth.    [provider]  lidocaine (LIDODERM) 5 % Place 1 patch onto the skin daily. Remove & Discard patch within 12 hours or as directed by MD    [provider]  lidocaine (XYLOCAINE) 2 % solution Use as directed 30 mLs in the mouth or throat 3 (three) times daily. apply directly to the oral ulcers 3 times a day x 2 weeks; 02/28/18   Lynann BolognaGupta, Rajesh, MD  loratadine (CLARITIN) 10 MG tablet Take by mouth.    [provider]  metFORMIN (GLUCOPHAGE) 500 MG tablet Take 250 mg by mouth daily. Takes 1/2 tablet daily.     [provider]  Misc Natural Products (OSTEO BI-FLEX ADV TRIPLE ST PO) Take by mouth daily.    [provider]  OVER THE COUNTER MEDICATION     [provider]  oxyCODONE (OXY IR/ROXICODONE) 5 MG immediate release tablet  11/01/17   [provider]  pantoprazole (PROTONIX) 40 MG tablet Take 1 tablet (40 mg total) by mouth daily. 01/25/18   Lynann BolognaGupta, Rajesh, MD  Polyethyl Glycol-Propyl Glycol (SYSTANE ULTRA OP) Place 1 drop into both eyes 2 (two) times daily.    [provider]  pravastatin (PRAVACHOL) 40 MG tablet Take by mouth. 12/27/17   [provider]  predniSONE (DELTASONE) 10 MG tablet  10/30/17   [provider]  Probiotic Product (PROBIOTIC PO) Take by mouth daily.    [provider]  promethazine (PHENERGAN) 12.5 MG tablet Take 12.5 mg by mouth every 6 (six) hours as needed for nausea.    [provider]  propranolol (INDERAL) 80 MG tablet Take 80 mg by mouth daily.    [provider]  ranitidine (ZANTAC) 300 MG tablet Take 300 mg by mouth at bedtime.    [provider]  vitamin C  (ASCORBIC ACID) 500 MG tablet Take 500 mg by mouth daily.    [provider]    Family History Family History  Problem Relation Age of Onset  . Cancer Father     Social History Social History   Tobacco Use  . Smoking status: Never Smoker  . Smokeless tobacco: Never Used  Substance Use Topics  . Alcohol use: No  . Drug use: No     Allergies   Dilaudid [hydromorphone]; Aspirin; Codeine; Latex; Morphine; Morphine and related; Nsaids; and Statins   Review of Systems Review of Systems  Constitutional: Negative for chills and fever.  Respiratory: Negative for shortness of breath.   Cardiovascular: Negative for chest pain.  Gastrointestinal: Positive for  abdominal pain, constipation and nausea. Negative for diarrhea and vomiting.  Genitourinary: Negative for difficulty urinating and dysuria.  Neurological: Positive for weakness and light-headedness. Negative for syncope.  All other systems reviewed and are negative.    Physical Exam Updated Vital Signs BP (!) 161/84 (BP Location: Right Arm)   Pulse 67   Temp 97.7 F (36.5 C) (Oral)   Resp 14   SpO2 99%   Physical Exam Vitals signs and nursing note reviewed.  Constitutional:      General: She is not in acute distress.    Appearance: She is well-developed. She is not ill-appearing.     Comments: Calm, cooperative. Talkative  HENT:     Head: Normocephalic and atraumatic.  Eyes:     General: No scleral icterus.       Right eye: No discharge.        Left eye: No discharge.     Conjunctiva/sclera: Conjunctivae normal.     Pupils: Pupils are equal, round, and reactive to light.  Neck:     Musculoskeletal: Normal range of motion.  Cardiovascular:     Rate and Rhythm: Normal rate and regular rhythm.  Pulmonary:     Effort: Pulmonary effort is normal. No respiratory distress.     Breath sounds: Normal breath sounds.  Abdominal:     General: Abdomen is protuberant. Bowel sounds are increased. There is no  distension.     Palpations: Abdomen is soft.     Tenderness: There is abdominal tenderness in the epigastric area.     Hernia: No hernia is present.  Skin:    General: Skin is warm and dry.  Neurological:     Mental Status: She is alert and oriented to person, place, and time.  Psychiatric:        Behavior: Behavior normal.      ED Treatments / Results  Labs (all labs ordered are listed, but only abnormal results are displayed) Labs Reviewed  CBC WITH DIFFERENTIAL/PLATELET - Abnormal; Notable for the following components:      Result Value   WBC 11.6 (*)    Monocytes Absolute 1.5 (*)    Eosinophils Absolute 0.6 (*)    All other components within normal limits  COMPREHENSIVE METABOLIC PANEL - Abnormal; Notable for the following components:   Glucose, Bld 188 (*)    Creatinine, Ser 1.37 (*)    GFR calc non Af Amer 38 (*)    GFR calc Af Amer 44 (*)    All other components within normal limits  LIPASE, BLOOD - Abnormal; Notable for the following components:   Lipase 62 (*)    All other components within normal limits  CBG MONITORING, ED - Abnormal; Notable for the following components:   Glucose-Capillary 222 (*)    All other components within normal limits  C DIFFICILE QUICK SCREEN W PCR REFLEX  I-STAT TROPONIN, ED    EKG None  Radiology No results found.  Procedures Procedures (including critical care time)  Medications Ordered in ED Medications - No data to display   Initial Impression / Assessment and Plan / ED Course  I have reviewed the triage vital signs and the nursing notes.  Pertinent labs & imaging results that were available during my care of the patient were reviewed by me and considered in my medical decision making (see chart for details).  73 year old female presents with near syncope and abdominal pain. She is hypertensive but otherwise vitals are normal. Orthostatics are negative.  Symptoms sounds more vasovagal since they occurred while on the  toilet. EKG is NSR with inverted T waves in V1 and V2 which she's had in the past. Her abdomen is soft and non-tender although she seems to be having spasms. Will give protonix and mylanta.  CBC is remarkable for mild leukocytosis. CMP is remarkable for mild AKI (1.37). Troponin is 0. CT shows colitis.  Informed by nursing now the patient is having multiple loose stools with blood streaks. Will obtain C.diff since pt reports taking Keflex recently. She has been ambulatory to the bathroom and doesn't feel lightheaded anymore. Suspect acute GI illness. She has an rx for Zofran at home. RX for Cipro/Flagyl was given. She was encouraged to drink fluids and return if worsening  Final Clinical Impressions(s) / ED Diagnoses   Final diagnoses:  Periumbilical abdominal pain  Near syncope  Colitis    ED Discharge Orders    None       Bethel Born, PA-C 05/06/18 1528    Sabas Sous, MD 05/06/18 [phone]

## 2018-05-05 NOTE — ED Notes (Signed)
Called CT, pt is next in line.  

## 2018-05-05 NOTE — Discharge Instructions (Addendum)
Please increase fluid intake Take Zofran for nausea Start Cipro and Flagyl  Take Tylenol for pain as needed Return if worsening

## 2018-05-09 ENCOUNTER — Other Ambulatory Visit: Payer: Self-pay

## 2018-05-09 ENCOUNTER — Other Ambulatory Visit: Payer: Self-pay | Admitting: Gastroenterology

## 2018-05-09 ENCOUNTER — Telehealth: Payer: Self-pay | Admitting: Gastroenterology

## 2018-05-09 DIAGNOSIS — R1013 Epigastric pain: Secondary | ICD-10-CM

## 2018-05-09 MED ORDER — PANTOPRAZOLE SODIUM 40 MG PO TBEC: 40.0000 mg | DELAYED_RELEASE_TABLET | Freq: Every day | ORAL | 0 refills | Status: DC

## 2018-05-09 NOTE — Progress Notes (Signed)
Order not placed when patient was discharged from ER; MD approved reorder of medication for patient;

## 2018-05-09 NOTE — Telephone Encounter (Signed)
Called and spoke with patient-patient reports she was instructed by PA in ER to obtain Protonix RX; patient's Protonix was refilled,  after verbal approval from Dr. Chales Abrahams, per patient request as the PA-C, Jefm Bryant order was pended and not released; Patient verbalized understanding of information/instructions; Patient was advised to call back if questions/concerns arise;

## 2018-05-09 NOTE — Telephone Encounter (Signed)
Pt's husband Marlinda Mike called looking for pt to be seen today due to having bloody stools. Pt was seen at the ED on 1/23. Pls call pt's house.

## 2018-05-10 ENCOUNTER — Other Ambulatory Visit (INDEPENDENT_AMBULATORY_CARE_PROVIDER_SITE_OTHER): Payer: Medicare HMO

## 2018-05-10 ENCOUNTER — Encounter: Payer: Self-pay | Admitting: Gastroenterology

## 2018-05-10 ENCOUNTER — Ambulatory Visit: Payer: Medicare HMO | Admitting: Gastroenterology

## 2018-05-10 ENCOUNTER — Ambulatory Visit (HOSPITAL_BASED_OUTPATIENT_CLINIC_OR_DEPARTMENT_OTHER)
Admission: RE | Admit: 2018-05-10 | Discharge: 2018-05-10 | Disposition: A | Discharge status: Home / Self Care | Payer: Medicare HMO | Source: Ambulatory Visit | Attending: Gastroenterology | Admitting: Gastroenterology

## 2018-05-10 VITALS — BP 110/72 | HR 82 | Ht 64.0 in | Wt 131.2 lb

## 2018-05-10 DIAGNOSIS — R109 Unspecified abdominal pain: Secondary | ICD-10-CM

## 2018-05-10 LAB — CBC WITH DIFFERENTIAL/PLATELET
Basophils Absolute: 0.1 10*3/uL (ref 0.0–0.1)
Basophils Relative: 0.7 % (ref 0.0–3.0)
Eosinophils Absolute: 0.5 10*3/uL (ref 0.0–0.7)
Eosinophils Relative: 4.3 % (ref 0.0–5.0)
HCT: 37.8 % (ref 36.0–46.0)
Hemoglobin: 12.6 g/dL (ref 12.0–15.0)
LYMPHS ABS: 2 10*3/uL (ref 0.7–4.0)
Lymphocytes Relative: 18.9 % (ref 12.0–46.0)
MCHC: 33.4 g/dL (ref 30.0–36.0)
MCV: 90.6 fl (ref 78.0–100.0)
Monocytes Absolute: 1.3 10*3/uL — ABNORMAL HIGH (ref 0.1–1.0)
Monocytes Relative: 12.3 % — ABNORMAL HIGH (ref 3.0–12.0)
Neutro Abs: 6.7 10*3/uL (ref 1.4–7.7)
Neutrophils Relative %: 63.8 % (ref 43.0–77.0)
Platelets: 258 10*3/uL (ref 150.0–400.0)
RBC: 4.17 Mil/uL (ref 3.87–5.11)
RDW: 13.2 % (ref 11.5–15.5)
WBC: 10.5 10*3/uL (ref 4.0–10.5)

## 2018-05-10 LAB — COMPREHENSIVE METABOLIC PANEL
ALT: 10 U/L (ref 0–35)
AST: 13 U/L (ref 0–37)
Albumin: 3.9 g/dL (ref 3.5–5.2)
Alkaline Phosphatase: 69 U/L (ref 39–117)
BUN: 15 mg/dL (ref 6–23)
CO2: 26 mEq/L (ref 19–32)
Calcium: 9.6 mg/dL (ref 8.4–10.5)
Chloride: 100 mEq/L (ref 96–112)
Creatinine, Ser: 1 mg/dL (ref 0.40–1.20)
GFR: 54.26 mL/min — ABNORMAL LOW (ref >60.00)
GLUCOSE: 122 mg/dL — AB (ref 70–99)
Potassium: 3.6 mEq/L (ref 3.5–5.1)
Sodium: 133 mEq/L — ABNORMAL LOW (ref 135–145)
TOTAL PROTEIN: 7.3 g/dL (ref 6.0–8.3)
Total Bilirubin: 0.5 mg/dL (ref 0.2–1.2)

## 2018-05-10 LAB — MAGNESIUM: MAGNESIUM: 1.5 mg/dL (ref 1.5–2.5)

## 2018-05-10 MED ORDER — PANTOPRAZOLE SODIUM 40 MG PO TBEC: 40.0000 mg | DELAYED_RELEASE_TABLET | Freq: Every day | ORAL | 2 refills | Status: DC

## 2018-05-10 MED ORDER — PROMETHAZINE HCL 25 MG PO TABS: 25.0000 mg | ORAL_TABLET | Freq: Four times a day (QID) | ORAL | 0 refills | Status: DC | PRN

## 2018-05-10 NOTE — Progress Notes (Signed)
Chief Complaint: Epigastric pain  Referring Provider:  Leane CallNodal, Jr Reinaldo, PA-C      ASSESSMENT AND PLAN;   #1.  Acute colitis on CT 05/05/2018-highly s/o ischemic colitis d/t clinical presentation, location.  Patent mesenteric blood vessels on CT. neg stool studies.  Treated with Cipro and Flagyl.  Still with abdominal pain.  Could be developing early ileus. #2. GERD with HH. EGD 01/2018 superficial GU, Bx neg for HP/celiac.  #3. Epigastric pain. (s/p chole for biliary dyskinesia) #4. IBS with constipation - neg cologuard 2017, neg colon 2001 Dr Victorino DikeSam [place] (advised not to get it done by Dr Victorino DikeSam [place] as she had very redundant colon, high risk for perforation).  Plan: - Clear liquid diet until abdominal distention is better.  Finish Cipro and Flagyl.  - X ray KUB 3view stat.  - CBC, CMP, Mg today. - Start Protonix 40 mg p.o. once a day. - Phenergan 25 mg p.o. every 6 hours as needed nausea. #25. - I have offered her Adm to the hospital.  She would like to hold off.  She will call us tomorrow to see how she is doing.  If she continues to have problems and the above WU is neg, would proceed with CTA abdomen/pelvis.  - Routine FU in 4 weeks, At FU- Mesenteric doppler/colon/further work-up.    HPI:    Jasmine Spryhyllis C Farrell is a 73 y.o. 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 Victorino DikeSam [place] Seen in the emergency room on 05/05/2018 with acute abdominal pain with abdominal bloating and bloody diarrhea.  CT scan showed colitis involving transverse colon, splenic flexure and proximal sigmoid colon. ?  Etiology.  Mesenteric blood vessels were patent.  He was negative for C. Difficile Started on Cipro and Flagyl.  Offered to get admitted. However patient declined. Still, would like to hold off.  Seen as an emergency work-up in the clinic today.  She continues to have abdominal pain intermittently with abdominal distention.  No fever or chills.  Still has some bowel movements with blood which is  getting much better.  Has some nausea which was controlled on Phenergan in the ED.  No fever or chills.  Has long-standing history of constipation, better with increased water intake Husband recently had a car wreck-hence patient has not been exercising as she is taking care of him.   Past Medical History:  Diagnosis Date  . Anemia   . Anginal pain (HCC)   . Arthritis   . Colitis   . Complication of anesthesia    narrow airway per pt can have a bp drop and hard to wake up  . Diabetes mellitus without complication (HCC)   . Diffuse cystic mastopathy   . Diverticulosis   . Family history of adverse reaction to anesthesia    brother and sister hard to wake up  . GERD (gastroesophageal reflux disease)   . Headache   . Heart murmur   . History of kidney stones   . Hyperlipidemia   . Mitral valve disorder    MVP  . Paroxysmal atrial tachycardia (HCC)   . PONV (postoperative nausea and vomiting)   . PSVT (paroxysmal supraventricular tachycardia) (HCC)   . PVC's (premature ventricular contractions)   . SVT (supraventricular tachycardia) (HCC)   . Varicose veins     Past Surgical History:  Procedure Laterality Date  . ABDOMINAL HYSTERECTOMY    . ANTERIOR AND POSTERIOR VAGINAL REPAIR    . ANTERIOR CERVICAL DECOMP/DISCECTOMY FUSION N/A 08/19/2016  Procedure: ANTERIOR CERVICAL DECOMPRESSION/DISCECTOMY FUSION CERVICAL FOUR- CERVICAL FIVE, CERVICAL FIVE- CERVICAL SIX;  Surgeon: Tressie StalkerJenkins, Jeffrey, MD;  [facility] OR;  Service: Neurosurgery;  Laterality: N/A;  ANTERIOR CERVICAL DECOMPRESSION/DISCECTOMY FUSION CERVICAL 4- CERVICAL 5, CERVICAL 5- CERVICAL6  . APPENDECTOMY    . BREAST LUMPECTOMY    . CHOLECYSTECTOMY    . ELBOW SURGERY Right   . HERNIA REPAIR      Family History  Problem Relation Age of Onset  . Esophageal cancer Father   . Diverticulitis Sister   . Colon cancer Neg Hx     Social History   Tobacco Use  . Smoking status: Never Smoker  . Smokeless tobacco: Never  Used  Substance Use Topics  . Alcohol use: No  . Drug use: No    Current Outpatient Medications  Medication Sig Dispense Refill  . ACAI BERRY PO Take 1 tablet by mouth daily as needed (for increased energy).    Marland Kitchen. acetaminophen (TYLENOL) 500 MG tablet Take 500-1,000 mg by mouth every 6 (six) hours as needed for mild pain or headache.     . ciprofloxacin (CIPRO) 500 MG tablet Take 1 tablet (500 mg total) by mouth 2 (two) times daily. 10 tablet 0  . co-enzyme Q-10 30 MG capsule Take 30 mg by mouth 3 (three) times a week.     . Cyanocobalamin (B-12) 2500 MCG TABS Take 2,500 mcg by mouth daily as needed (to boost the immune system).     Marland Kitchen. docusate sodium (COLACE) 100 MG capsule Take 1 capsule (100 mg total) by mouth 2 (two) times daily. (Patient taking differently: Take 100 mg by mouth daily as needed for mild constipation. ) 60 capsule 0  . Famotidine (PEPCID AC PO) Take 1 tablet by mouth at bedtime.    . lidocaine (LIDODERM) 5 % Place 1 patch onto the skin daily as needed (for pain). Remove & Discard patch within 12 hours or as directed by MD     . lidocaine (XYLOCAINE) 2 % solution Use as directed 30 mLs in the mouth or throat 3 (three) times daily. apply directly to the oral ulcers 3 times a day x 2 weeks; 30 mL 2  . loratadine (CLARITIN) 10 MG tablet Take 10 mg by mouth daily as needed for allergies or rhinitis.     . metFORMIN (GLUCOPHAGE) 500 MG tablet Take 250-500 mg by mouth See admin instructions. Take 250 mg by mouth in the morning before breakfast and 500 mg with the largest meal of the day    . metroNIDAZOLE (FLAGYL) 500 MG tablet Take 1 tablet (500 mg total) by mouth 3 (three) times daily. 21 tablet 0  . Misc Natural Products (OSTEO BI-FLEX ADV TRIPLE ST PO) Take 1 capsule by mouth daily.     . ondansetron (ZOFRAN-ODT) 8 MG disintegrating tablet Take 8 mg by mouth every 8 (eight) hours as needed for nausea or vomiting.    Bertram Gala. Polyethyl Glycol-Propyl Glycol (SYSTANE ULTRA OP) Place 1 drop  into both eyes 2 (two) times daily.    . Probiotic Product (PROBIOTIC PO) Take 1 capsule by mouth daily.     . propranolol ER (INDERAL LA) 80 MG 24 hr capsule Take 80 mg by mouth daily.    . vitamin C (ASCORBIC ACID) 500 MG tablet Take 500 mg by mouth daily.     No current facility-administered medications for this visit.     Allergies  Allergen Reactions  . Dilaudid [Hydromorphone] Shortness Of Breath, Nausea And Vomiting  and Other (See Comments)    "Depressed my respiratory drive and made me hypotensive"  . Fentanyl Shortness Of Breath and Nausea And Vomiting    "Depressed my respiratory drive and made me hypotensive"  . Latex Anaphylaxis, Rash and Other (See Comments)    Caused redness also; BLISTERS THE SKIN (a bite block did this)  . Morphine Shortness Of Breath and Nausea And Vomiting    "Depressed my respiratory drive and made me hypotensive"  . Morphine And Related Shortness Of Breath, Nausea And Vomiting and Other (See Comments)    "Depressed my respiratory drive and made me hypotensive"  . Aspirin Nausea Only    Irritates the stomach  . Codeine Nausea And Vomiting  . Nsaids Nausea Only and Other (See Comments)    These irritate the stomach  . Other Other (See Comments)    Patient is sensitive to "binders" or "fillers" in meds  . Oxycodone Nausea Only and Other (See Comments)    Requires anti-nausea medication to tolerate  . Statins Other (See Comments)    "Made my joints hurt"  . Verapamil Other (See Comments)    "Threw me into V-Tach"  . Adhesive [Tape] Rash and Other (See Comments)    Leaves an "imprint" on the skin    Review of Systems:  neg     Physical Exam:    BP 110/72   Pulse 82   Ht 5\' 4"  (1.626 m)   Wt 131 lb 4 oz (59.5 kg)   BMI 22.53 kg/m  Filed Weights   05/10/18 1353  Weight: 131 lb 4 oz (59.5 kg)   Constitutional:  Well-developed, in no acute distress. Psychiatric: Normal mood and affect. Behavior is normal. HEENT: Pupils normal.   Conjunctivae are normal. No scleral icterus. Neck supple.  Cardiovascular: Normal rate, regular rhythm. No edema Pulmonary/chest: Effort normal and breath sounds normal. No wheezing, rales or rhonchi. Abdominal: Soft, slightly distended.  Well-healed surgical scars, mild epigastric tenderness.. Bowel sounds active throughout. There are no masses palpable. No hepatomegaly. Rectal:  defered Neurological: Alert and oriented to person place and time. Skin: Skin is warm and dry. No rashes noted.  Data Reviewed: I have personally reviewed following labs and imaging studies  CBC: CBC Latest Ref Rng & Units 05/05/2018 09/25/2016 08/13/2016  WBC 4.0 - 10.5 K/uL 11.6(H) 8.0 6.7  Hemoglobin 12.0 - 15.0 g/dL 78.2 95.6 21.3  Hematocrit 36.0 - 46.0 % 42.8 35.3(L) 39.4  Platelets 150 - 400 K/uL 231 228 220    CMP: CMP Latest Ref Rng & Units 05/05/2018 09/25/2016 08/13/2016  Glucose 70 - 99 mg/dL 086(V) 784(O) 98  BUN 8 - 23 mg/dL 17 12 19   Creatinine 0.44 - 1.00 mg/dL 9.62(X) 5.28 4.13  Sodium 135 - 145 mmol/L 135 131(L) 139  Potassium 3.5 - 5.1 mmol/L 4.5 3.7 4.1  Chloride 98 - 111 mmol/L 100 103 107  CO2 22 - 32 mmol/L 24 20(L) 26  Calcium 8.9 - 10.3 mg/dL 24.4 9.0 9.6  Total Protein 6.5 - 8.1 g/dL 7.8 6.9 -  Total Bilirubin 0.3 - 1.2 mg/dL 1.1 0.9 -  Alkaline Phos 38 - 126 U/L 73 75 -  AST 15 - 41 U/L 21 20 -  ALT 0 - 44 U/L 20 17 -   CT 05/05/2018 IMPRESSION: Bowel wall thickening of the mid transverse colon through proximal sigmoid colon consistent with colitis; differential diagnosis would include infection and inflammatory bowel disease, not excluded but considered less likely. Minimal sigmoid diverticulosis  without evidence of diverticulitis.   Electronically Signed   By: Ulyses Southward M.D.   On: 05/05/2018 16:46 On review of the CT-some stool in the right colon, has aortic atherosclerosis, blood vessels seem to be patent     Edman Circle, MD 05/10/2018, 2:08 PM  Cc: Leane Call, PA-C

## 2018-05-10 NOTE — Addendum Note (Signed)
Addended by: Harley Alto on: 05/10/2018 02:42 PM   Modules accepted: Orders

## 2018-05-10 NOTE — Patient Instructions (Signed)
If you are age 74 or older, your body mass index should be between 23-30. Your Body mass index is 22.53 kg/m. If this is out of the aforementioned range listed, please consider follow up with your Primary Care Provider.  If you are age 64 or younger, your body mass index should be between 19-25. Your Body mass index is 22.53 kg/m. If this is out of the aformentioned range listed, please consider follow up with your Primary Care Provider.   We have sent the following medications to your pharmacy for you to pick up at your convenience: Protonix 40 mg  Phenergan 25mg    Please call the office [phone]) if your symptoms gets worse or you feel like your not getting any better.  Please go to the lab on the 2nd floor [address] before you leave the office today.    Your provider has requested that you have an abdominal x ray before leaving today. Please go to the 1st floor to our Radiology department for the test.    Thank you,  Dr. Lynann Bologna

## 2018-06-07 ENCOUNTER — Other Ambulatory Visit (HOSPITAL_COMMUNITY): Payer: Self-pay | Admitting: Gastroenterology

## 2018-06-07 ENCOUNTER — Telehealth: Payer: Self-pay | Admitting: Gastroenterology

## 2018-06-07 ENCOUNTER — Ambulatory Visit: Payer: Medicare HMO | Admitting: Gastroenterology

## 2018-06-07 ENCOUNTER — Other Ambulatory Visit: Payer: Self-pay | Admitting: Gastroenterology

## 2018-06-07 ENCOUNTER — Encounter: Payer: Self-pay | Admitting: Gastroenterology

## 2018-06-07 VITALS — BP 124/72 | HR 76 | Ht 64.0 in | Wt 133.0 lb

## 2018-06-07 DIAGNOSIS — K219 Gastro-esophageal reflux disease without esophagitis: Secondary | ICD-10-CM | POA: Diagnosis not present

## 2018-06-07 DIAGNOSIS — K529 Noninfective gastroenteritis and colitis, unspecified: Secondary | ICD-10-CM | POA: Diagnosis not present

## 2018-06-07 DIAGNOSIS — K581 Irritable bowel syndrome with constipation: Secondary | ICD-10-CM | POA: Diagnosis not present

## 2018-06-07 DIAGNOSIS — R1013 Epigastric pain: Secondary | ICD-10-CM

## 2018-06-07 DIAGNOSIS — R109 Unspecified abdominal pain: Secondary | ICD-10-CM

## 2018-06-07 MED ORDER — ONDANSETRON 8 MG PO TBDP: 8.0000 mg | ORAL_TABLET | Freq: Three times a day (TID) | ORAL | 0 refills | Status: DC | PRN

## 2018-06-07 NOTE — Telephone Encounter (Signed)
Dr. Chales Abrahams please review previous notation and advise

## 2018-06-07 NOTE — Telephone Encounter (Signed)
Lyla Son from [facility] radiology called regarding [place] that was ordered. She stated that they looked into the pt's chart and based on her results from her CT abd pelvis on 05/05/18, she should have a CT angio instead of [place]. Pls call Carrie.

## 2018-06-07 NOTE — Patient Instructions (Addendum)
If you are age 74 or older, your body mass index should be between 23-30. Your Body mass index is 22.83 kg/m. If this is out of the aforementioned range listed, please consider follow up with your Primary Care Provider.  If you are age 15 or younger, your body mass index should be between 19-25. Your Body mass index is 22.83 kg/m. If this is out of the aformentioned range listed, please consider follow up with your Primary Care Provider.   We have sent the following medications to your pharmacy for you to pick up at your convenience: Zofran 8 mg   You have been scheduled for an abdominal ultrasound at [facility] Radiology (1st floor of hospital) on 06/10/18 at 10:30am. Please arrive 15 minutes prior to your appointment for registration. Make certain not to have anything to eat or drink 6 hours prior to your appointment. Should you need to reschedule your appointment, please contact radiology at [phone]. This test typically takes about 30 minutes to perform.    Thank you,  Dr. Lynann Bologna

## 2018-06-07 NOTE — Progress Notes (Signed)
Chief Complaint: Epigastric pain  Referring Provider:  Leane Call, PA-C      ASSESSMENT AND PLAN;   #1.  H/O Acute colitis on CT 05/05/2018-highly s/o ischemic colitis d/t clinical presentation, location (resolved).  Patent mesenteric blood vessels on CT. neg stool studies.  Treated with Cipro and Flagyl.  Still with occ abdominal pain.  #2. GERD with HH. EGD 01/2018 superficial GU, Bx neg for HP/celiac.   #3. Epigastric pain. (s/p chole for biliary dyskinesia).  #4. IBS with constipation - neg cologuard 2017, neg colon 2001 Dr Victorino Dike (advised not to get it done by Dr Victorino Dike as she had very redundant colon, high risk for perforation).  Plan:  - Miralax 17g po bid. - Colace 1 tab po bid to continue. - Mesenteric [place] with  doppler - Protonix 40 mg p.o. once a day to continue. - Zofran 8mg  tab 1 q8 hrs prn #20. - Wants to get cologuard test in 4 weeks. Doesnot want colonoscopy. - Routine FU in 12 weeks.    HPI:    Jasmine Farrell is a 73 y.o. 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 Victorino Dike  Feels much better. Occ LUQ abdominal pain. No diarrhea.  Constipation is somewhat better with MiraLAX 17 g p.o. once a day and Colace 1 tablet p.o. twice daily.  No melena or hematochezia.  She also got a letter to get Cologuard test repeated.  She does not want colonoscopy at the present time.  No nausea/vomiting/heartburn/regurgitation.   ED visit - 05/05/2018 with acute abdominal pain with abdominal bloating and bloody diarrhea.  CT scan showed colitis involving transverse colon, splenic flexure and proximal sigmoid colon. ?  Etiology.  Mesenteric blood vessels were patent.  stool negative for C. Difficile. Started on Cipro and Flagyl.   No fever or chills.  Has long-standing history of constipation, better with increased water intake Husband recently had a car wreck-hence patient has not been exercising as she is taking care of him.   Past Medical  History:  Diagnosis Date  . Anemia   . Anginal pain (HCC)   . Arthritis   . Colitis   . Complication of anesthesia    narrow airway per pt can have a bp drop and hard to wake up  . Diabetes mellitus without complication (HCC)   . Diffuse cystic mastopathy   . Diverticulosis   . Family history of adverse reaction to anesthesia    brother and sister hard to wake up  . GERD (gastroesophageal reflux disease)   . Headache   . Heart murmur   . History of kidney stones   . Hyperlipidemia   . Mitral valve disorder    MVP  . Paroxysmal atrial tachycardia (HCC)   . PONV (postoperative nausea and vomiting)   . PSVT (paroxysmal supraventricular tachycardia) (HCC)   . PVC's (premature ventricular contractions)   . SVT (supraventricular tachycardia) (HCC)   . Varicose veins     Past Surgical History:  Procedure Laterality Date  . ABDOMINAL HYSTERECTOMY    . ANTERIOR AND POSTERIOR VAGINAL REPAIR    . ANTERIOR CERVICAL DECOMP/DISCECTOMY FUSION N/A 08/19/2016   Procedure: ANTERIOR CERVICAL DECOMPRESSION/DISCECTOMY FUSION CERVICAL FOUR- CERVICAL FIVE, CERVICAL FIVE- CERVICAL SIX;  Surgeon: Tressie Stalker, MD;  Location: [facility] OR;  Service: Neurosurgery;  Laterality: N/A;  ANTERIOR CERVICAL DECOMPRESSION/DISCECTOMY FUSION CERVICAL 4- CERVICAL 5, CERVICAL 5- CERVICAL6  . APPENDECTOMY    . BREAST LUMPECTOMY    . CHOLECYSTECTOMY    .  ELBOW SURGERY Right   . HERNIA REPAIR      Family History  Problem Relation Age of Onset  . Esophageal cancer Father   . Diverticulitis Sister   . Colon cancer Neg Hx     Social History   Tobacco Use  . Smoking status: Never Smoker  . Smokeless tobacco: Never Used  Substance Use Topics  . Alcohol use: No  . Drug use: No    Current Outpatient Medications  Medication Sig Dispense Refill  . ACAI BERRY PO Take 1 tablet by mouth daily as needed (for increased energy).    Marland Kitchen acetaminophen (TYLENOL) 500 MG tablet Take 500-1,000 mg by mouth every 6 (six) hours  as needed for mild pain or headache.     . ciprofloxacin (CIPRO) 500 MG tablet Take 1 tablet (500 mg total) by mouth 2 (two) times daily. 10 tablet 0  . co-enzyme Q-10 30 MG capsule Take 30 mg by mouth 3 (three) times a week.     . Cyanocobalamin (B-12) 2500 MCG TABS Take 2,500 mcg by mouth daily as needed (to boost the immune system).     Marland Kitchen docusate sodium (COLACE) 100 MG capsule Take 1 capsule (100 mg total) by mouth 2 (two) times daily. (Patient taking differently: Take 100 mg by mouth daily as needed for mild constipation. ) 60 capsule 0  . Famotidine (PEPCID AC PO) Take 1 tablet by mouth at bedtime.    . lidocaine (LIDODERM) 5 % Place 1 patch onto the skin daily as needed (for pain). Remove & Discard patch within 12 hours or as directed by MD     . lidocaine (XYLOCAINE) 2 % solution Use as directed 30 mLs in the mouth or throat 3 (three) times daily. apply directly to the oral ulcers 3 times a day x 2 weeks; 30 mL 2  . loratadine (CLARITIN) 10 MG tablet Take 10 mg by mouth daily as needed for allergies or rhinitis.     . metFORMIN (GLUCOPHAGE) 500 MG tablet Take 250-500 mg by mouth See admin instructions. Take 250 mg by mouth in the morning before breakfast and 500 mg with the largest meal of the day    . metroNIDAZOLE (FLAGYL) 500 MG tablet Take 1 tablet (500 mg total) by mouth 3 (three) times daily. 21 tablet 0  . Misc Natural Products (OSTEO BI-FLEX ADV TRIPLE ST PO) Take 1 capsule by mouth daily.     . ondansetron (ZOFRAN-ODT) 8 MG disintegrating tablet Take 8 mg by mouth every 8 (eight) hours as needed for nausea or vomiting.    . pantoprazole (PROTONIX) 40 MG tablet Take 1 tablet (40 mg total) by mouth daily. 30 tablet 2  . Polyethyl Glycol-Propyl Glycol (SYSTANE ULTRA OP) Place 1 drop into both eyes 2 (two) times daily.    . Probiotic Product (PROBIOTIC PO) Take 1 capsule by mouth daily.     . promethazine (PHENERGAN) 25 MG tablet Take 1 tablet (25 mg total) by mouth every 6 (six) hours  as needed for nausea or vomiting. 25 tablet 0  . propranolol ER (INDERAL LA) 80 MG 24 hr capsule Take 80 mg by mouth daily.    . vitamin C (ASCORBIC ACID) 500 MG tablet Take 500 mg by mouth daily.     No current facility-administered medications for this visit.     Allergies  Allergen Reactions  . Dilaudid [Hydromorphone] Shortness Of Breath, Nausea And Vomiting and Other (See Comments)    "Depressed my respiratory drive  and made me hypotensive"  . Fentanyl Shortness Of Breath and Nausea And Vomiting    "Depressed my respiratory drive and made me hypotensive"  . Latex Anaphylaxis, Rash and Other (See Comments)    Caused redness also; BLISTERS THE SKIN (a bite block did this)  . Morphine Shortness Of Breath and Nausea And Vomiting    "Depressed my respiratory drive and made me hypotensive"  . Morphine And Related Shortness Of Breath, Nausea And Vomiting and Other (See Comments)    "Depressed my respiratory drive and made me hypotensive"  . Aspirin Nausea Only    Irritates the stomach  . Codeine Nausea And Vomiting  . Nsaids Nausea Only and Other (See Comments)    These irritate the stomach  . Other Other (See Comments)    Patient is sensitive to "binders" or "fillers" in meds  . Oxycodone Nausea Only and Other (See Comments)    Requires anti-nausea medication to tolerate  . Statins Other (See Comments)    "Made my joints hurt"  . Verapamil Other (See Comments)    "Threw me into V-Tach"  . Adhesive [Tape] Rash and Other (See Comments)    Leaves an "imprint" on the skin    Review of Systems:  neg     Physical Exam:    BP 124/72   Pulse 76   Ht 5\' 4"  (1.626 m)   Wt 133 lb (60.3 kg)   BMI 22.83 kg/m  Filed Weights   06/07/18 1133  Weight: 133 lb (60.3 kg)   Constitutional:  Well-developed, in no acute distress. Psychiatric: Normal mood and affect. Behavior is normal. HEENT: Pupils normal.  Conjunctivae are normal. No scleral icterus. Neck supple.  Cardiovascular:  Normal rate, regular rhythm. No edema Pulmonary/chest: Effort normal and breath sounds normal. No wheezing, rales or rhonchi. Abdominal: Soft, slightly distended.  Well-healed surgical scars, mild epigastric tenderness.. Bowel sounds active throughout. There are no masses palpable. No hepatomegaly. Rectal:  defered Neurological: Alert and oriented to person place and time. Skin: Skin is warm and dry. No rashes noted.  Data Reviewed: I have personally reviewed following labs and imaging studies  CBC: CBC Latest Ref Rng & Units 05/10/2018 05/05/2018 09/25/2016  WBC 4.0 - 10.5 K/uL 10.5 11.6(H) 8.0  Hemoglobin 12.0 - 15.0 g/dL 37.1 06.2 69.4  Hematocrit 36.0 - 46.0 % 37.8 42.8 35.3(L)  Platelets 150.0 - 400.0 K/uL 258.0 231 228    CMP: CMP Latest Ref Rng & Units 05/10/2018 05/05/2018 09/25/2016  Glucose 70 - 99 mg/dL 854(O) 270(J) 500(X)  BUN 6 - 23 mg/dL 15 17 12   Creatinine 0.40 - 1.20 mg/dL 3.81 8.29(H) 3.71  Sodium 135 - 145 mEq/L 133(L) 135 131(L)  Potassium 3.5 - 5.1 mEq/L 3.6 4.5 3.7  Chloride 96 - 112 mEq/L 100 100 103  CO2 19 - 32 mEq/L 26 24 20(L)  Calcium 8.4 - 10.5 mg/dL 9.6 69.6 9.0  Total Protein 6.0 - 8.3 g/dL 7.3 7.8 6.9  Total Bilirubin 0.2 - 1.2 mg/dL 0.5 1.1 0.9  Alkaline Phos 39 - 117 U/L 69 73 75  AST 0 - 37 U/L 13 21 20   ALT 0 - 35 U/L 10 20 17    CT 05/05/2018 IMPRESSION: Bowel wall thickening of the mid transverse colon through proximal sigmoid colon consistent with colitis; differential diagnosis would include infection and inflammatory bowel disease, not excluded but considered less likely. Minimal sigmoid diverticulosis without evidence of diverticulitis.   Electronically Signed   By: Angelyn Punt.D.  On: 05/05/2018 16:46 On review of the CT-some stool in the right colon, has aortic atherosclerosis, blood vessels seem to be patent     Edman Circle, MD 06/07/2018, 11:38 AM  Cc: Leane Call, PA-C

## 2018-06-08 NOTE — Telephone Encounter (Signed)
Wanted to avoid radiation Please get US aorta with mesenteric Doppler (I believe cardiology does it)

## 2018-06-08 NOTE — Telephone Encounter (Signed)
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

## 2018-06-08 NOTE — Telephone Encounter (Signed)
Pt sched for 2.28.20 appt but stated she may have to resched that appt.  Please call back.

## 2018-06-10 ENCOUNTER — Ambulatory Visit (HOSPITAL_COMMUNITY): Payer: Medicare HMO

## 2018-06-15 ENCOUNTER — Ambulatory Visit (HOSPITAL_COMMUNITY)
Admission: RE | Admit: 2018-06-15 | Discharge: 2018-06-15 | Disposition: A | Discharge status: Home / Self Care | Payer: Medicare Other | Source: Ambulatory Visit | Attending: Gastroenterology | Admitting: Gastroenterology

## 2018-06-15 ENCOUNTER — Encounter (HOSPITAL_COMMUNITY): Payer: Self-pay

## 2018-06-15 ENCOUNTER — Ambulatory Visit (HOSPITAL_BASED_OUTPATIENT_CLINIC_OR_DEPARTMENT_OTHER)
Admission: RE | Admit: 2018-06-15 | Discharge: 2018-06-15 | Disposition: A | Discharge status: Home / Self Care | Payer: Medicare Other | Source: Ambulatory Visit | Attending: Gastroenterology | Admitting: Gastroenterology

## 2018-06-15 DIAGNOSIS — K219 Gastro-esophageal reflux disease without esophagitis: Secondary | ICD-10-CM | POA: Diagnosis present

## 2018-06-15 DIAGNOSIS — R109 Unspecified abdominal pain: Secondary | ICD-10-CM

## 2018-06-15 DIAGNOSIS — R1013 Epigastric pain: Secondary | ICD-10-CM | POA: Diagnosis not present

## 2018-06-15 DIAGNOSIS — K529 Noninfective gastroenteritis and colitis, unspecified: Secondary | ICD-10-CM | POA: Insufficient documentation

## 2018-06-15 DIAGNOSIS — K581 Irritable bowel syndrome with constipation: Secondary | ICD-10-CM | POA: Diagnosis present

## 2018-06-15 NOTE — Telephone Encounter (Signed)
Reasoning behind changing order is due to scan is not able to be performed at [facility] at this time as it is ordered=vascular [id number] is the correct order for this patient to have the order from Dr. Chales Abrahams of Mesenteric [place] with doppler;  Scan being completed today at [facility] with vascular dept;

## 2018-06-15 NOTE — Addendum Note (Signed)
Addended by: Johnney Killian on: 06/15/2018 10:06 AM   Modules accepted: Orders

## 2018-07-04 ENCOUNTER — Telehealth: Payer: Self-pay

## 2018-07-04 ENCOUNTER — Telehealth: Payer: Self-pay | Admitting: Gastroenterology

## 2018-07-04 NOTE — Telephone Encounter (Signed)
[facility] PA for pantoprazole came through the fax and I attempted to do it on the covermymeds site and it wouldn't go through. Got in touch with a representative at [facility] told me per "Rashaun" that patient's insurance wasn't active and it needs to be to process any medication. I called patient and spoke to her and told her what was told and she said that her pharmacy said that she didn't need a PA and that she has the Good Rx and her prescription would be refilled in a couple of days but she told me that she was going to switch her pharmacy soon and that she mentioned her insurance is [employer] but I told her that if there is any insurance changes then please let her know and she said she said would call us

## 2018-07-04 NOTE — Telephone Encounter (Signed)
Pt wants to speak with someone about medication she stated that she was having a problem picking up from pharmacy

## 2018-07-05 NOTE — Telephone Encounter (Signed)
Called patient and left a voicemail and told her to give me a call back in regard to what her question is when it comes to her medication

## 2018-07-26 ENCOUNTER — Other Ambulatory Visit: Payer: Self-pay | Admitting: Gastroenterology

## 2018-07-28 DIAGNOSIS — Z20822 Contact with and (suspected) exposure to covid-19: Secondary | ICD-10-CM

## 2018-07-28 HISTORY — DX: Contact with and (suspected) exposure to covid-19: Z20.822

## 2018-08-20 ENCOUNTER — Other Ambulatory Visit: Payer: Self-pay | Admitting: Gastroenterology

## 2018-09-14 ENCOUNTER — Telehealth (INDEPENDENT_AMBULATORY_CARE_PROVIDER_SITE_OTHER): Payer: Medicare Other | Admitting: Gastroenterology

## 2018-09-14 ENCOUNTER — Other Ambulatory Visit: Payer: Self-pay | Admitting: Gastroenterology

## 2018-09-14 ENCOUNTER — Encounter: Payer: Self-pay | Admitting: Gastroenterology

## 2018-09-14 ENCOUNTER — Other Ambulatory Visit: Payer: Self-pay

## 2018-09-14 VITALS — Ht 64.0 in | Wt 130.0 lb

## 2018-09-14 DIAGNOSIS — K219 Gastro-esophageal reflux disease without esophagitis: Secondary | ICD-10-CM | POA: Diagnosis not present

## 2018-09-14 DIAGNOSIS — R1013 Epigastric pain: Secondary | ICD-10-CM | POA: Diagnosis not present

## 2018-09-14 DIAGNOSIS — K449 Diaphragmatic hernia without obstruction or gangrene: Secondary | ICD-10-CM

## 2018-09-14 DIAGNOSIS — R109 Unspecified abdominal pain: Secondary | ICD-10-CM | POA: Diagnosis not present

## 2018-09-14 DIAGNOSIS — K581 Irritable bowel syndrome with constipation: Secondary | ICD-10-CM

## 2018-09-14 MED ORDER — LINACLOTIDE 72 MCG PO CAPS: 72.0000 ug | ORAL_CAPSULE | Freq: Every day | ORAL | 11 refills | Status: DC

## 2018-09-14 MED ORDER — PANTOPRAZOLE SODIUM 40 MG PO TBEC: 40.0000 mg | DELAYED_RELEASE_TABLET | Freq: Every day | ORAL | 3 refills | Status: DC

## 2018-09-14 MED ORDER — ONDANSETRON HCL 8 MG PO TABS: 8.0000 mg | ORAL_TABLET | Freq: Three times a day (TID) | ORAL | 0 refills | Status: DC | PRN

## 2018-09-14 NOTE — Patient Instructions (Addendum)
If you are age 74 or older, your body mass index should be between 23-30. Your Body mass index is 22.31 kg/m. If this is out of the aforementioned range listed, please consider follow up with your Primary Care Provider.  If you are age 31 or younger, your body mass index should be between 19-25. Your Body mass index is 22.31 kg/m. If this is out of the aformentioned range listed, please consider follow up with your Primary Care Provider.   To help prevent the possible spread of infection to our patients, communities, and staff; we will be implementing the following measures:  As of now we are not allowing any visitors/family members to accompany you to any upcoming appointments with [facility]. If you have any concerns about this please contact our office to discuss prior to the appointment.   We have sent the following medications to your pharmacy for you to pick up at your convenience: Linzess daily. Protonix 40mg  daily. Zofran 8mg  every 8 hours as needed.  Please purchase the following medications over the counter and take as directed: Miralax 17g take one capful mixed with a drink of your choice and drink twice daily. Colace 1 tablet by mouth twice daily.  Your provider has ordered Cologuard testing as an option for colon cancer screening. This is performed by [employer] and may be out of network with your insurance. PRIOR to completing the test, it is YOUR responsibility to contact your insurance about covered benefits for this test. Your out of pocket expense could be anywhere from $0.00 to $649.00.   When you call to check coverage with your insurer, please provide the following information:   -The ONLY provider of Cologuard is [facility], dispensing  - CPT code for Cologuard is [phone].  [facility] [employer] NPI # [id number]  -[employer] Tax ID # [id number]   We have already sent your demographic and insurance information to [employer] (phone number [phone]) and they should contact you within the next week regarding your test. If you have not heard from them within the next week, please call our office at [phone].  Please call our office at [phone] to set up your 6 week follow up visit.  Thank you,  Dr. Lynann Bologna

## 2018-09-14 NOTE — Progress Notes (Signed)
Chief Complaint: Epigastric pain  Referring Provider:  Leane Call, PA-C      ASSESSMENT AND PLAN;   #1.  RUQ/LUQ pain. H/O Acute colitis on CT 05/05/2018-highly s/o ischemic colitis d/t clinical presentation, location (resolved).  Patent mesenteric blood vessels on CT. neg stool studies.  Treated with Cipro and Flagyl.  Still with occ abdominal pain.  #2. GERD with HH. EGD 01/2018 superficial GU, Bx neg for HP/celiac.   #3. Epigastric pain. (s/p chole for biliary dyskinesia).  #4. IBS with constipation - neg cologuard 2017, neg colon 2001 (Dr Victorino Dike advised not to get it done by Dr Victorino Dike as she had very redundant colon, high risk for perforation).  #5. UTI on bactrim.  Plan: - Miralax 17g po bid. - Colace 1 tab po bid to continue. - Linzess 75 mcg po qd (samples) - Check CBC, CMP, TSH, lipase and CRP. - CTA abdo ASAP (suspected ischemic colitis). If she can get it in [place] [facility] d/t travel. - Protonix 40 mg p.o. QD #30, 11 refils - Zofran 8mg  tab 1 q8 hrs prn #20. - Finish course of Bactrim for UTI - Cologuard test. Doesnot want colonoscopy at this time. - FU in 6 weeks.  If she continues to have problems, will consider colonoscopy followed by possible Sitz marker study.    HPI:    Jasmine Farrell is a 73 y.o. 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 Victorino Dike  Recently diagnosed with urinary tract infection and is currently on Bactrim.  Has been having problems with constipation despite of stool softeners and occasional MiraLAX.  Also started having increasing left upper quadrant abdominal pain.  No fever or chills.  No melena or hematochezia.  Very much concerned about possible ischemic bowel.  She also got a letter to get Cologuard test repeated.  She does not want colonoscopy at the present time.  No nausea/vomiting/heartburn/regurgitation.   ED visit - 05/05/2018 with acute abdominal pain with abdominal bloating and bloody diarrhea.  CT scan  showed colitis involving transverse colon, splenic flexure and proximal sigmoid colon. ?  Etiology.  Mesenteric blood vessels were patent.  stool negative for C. Difficile. Started on Cipro and Flagyl.   Has long-standing history of constipation, better with increased water intake Husband recently had a car wreck-hence patient has not been exercising as she is taking care of him.   Past Medical History:  Diagnosis Date  . Anemia   . Anginal pain (HCC)   . Arthritis   . Bladder infection   . Colitis   . Complication of anesthesia    narrow airway per pt can have a bp drop and hard to wake up  . Diabetes mellitus without complication (HCC)   . Diffuse cystic mastopathy   . Diverticulosis   . Family history of adverse reaction to anesthesia    brother and sister hard to wake up  . GERD (gastroesophageal reflux disease)   . Headache   . Heart murmur   . History of kidney stones   . Hyperlipidemia   . Mitral valve disorder    MVP  . Mitral valve prolapse   . Paroxysmal atrial tachycardia (HCC)   . PONV (postoperative nausea and vomiting)   . PSVT (paroxysmal supraventricular tachycardia) (HCC)   . PVC's (premature ventricular contractions)   . SVT (supraventricular tachycardia) (HCC)   . Varicose veins     Past Surgical History:  Procedure Laterality Date  . ABDOMINAL HYSTERECTOMY    .  ANTERIOR AND POSTERIOR VAGINAL REPAIR    . ANTERIOR CERVICAL DECOMP/DISCECTOMY FUSION N/A 08/19/2016   Procedure: ANTERIOR CERVICAL DECOMPRESSION/DISCECTOMY FUSION CERVICAL FOUR- CERVICAL FIVE, CERVICAL FIVE- CERVICAL SIX;  Surgeon: Tressie Stalker, MD;  Location: [facility] OR;  Service: Neurosurgery;  Laterality: N/A;  ANTERIOR CERVICAL DECOMPRESSION/DISCECTOMY FUSION CERVICAL 4- CERVICAL 5, CERVICAL 5- CERVICAL6  . APPENDECTOMY    . BREAST LUMPECTOMY    . CHOLECYSTECTOMY    . ELBOW SURGERY Right   . HERNIA REPAIR      Family History  Problem Relation Age of Onset  . Esophageal cancer Father   .  Diverticulitis Sister   . Colon cancer Neg Hx     Social History   Tobacco Use  . Smoking status: Never Smoker  . Smokeless tobacco: Never Used  Substance Use Topics  . Alcohol use: No  . Drug use: No    Current Outpatient Medications  Medication Sig Dispense Refill  . acetaminophen (TYLENOL) 500 MG tablet Take 500-1,000 mg by mouth every 6 (six) hours as needed for mild pain or headache.     . co-enzyme Q-10 30 MG capsule Take 30 mg by mouth 3 (three) times a week.     . Cyanocobalamin (B-12) 2500 MCG TABS Take 2,500 mcg by mouth daily as needed (to boost the immune system). Sublingual    . docusate sodium (COLACE) 100 MG capsule Take 1 capsule (100 mg total) by mouth 2 (two) times daily. (Patient taking differently: Take 100 mg by mouth daily as needed for mild constipation. ) 60 capsule 0  . Famotidine (PEPCID AC PO) Take 1 tablet by mouth at bedtime.    . lidocaine (LIDODERM) 5 % Place 1 patch onto the skin daily as needed (for pain). Remove & Discard patch within 12 hours or as directed by MD     . loratadine (CLARITIN) 10 MG tablet Take 10 mg by mouth daily as needed for allergies or rhinitis.     . metFORMIN (GLUCOPHAGE) 500 MG tablet Take 250-500 mg by mouth See admin instructions. Take 250 mg by mouth in the morning before breakfast and 500 mg with the largest meal of the day    . Misc Natural Products (OSTEO BI-FLEX ADV TRIPLE ST PO) Take 1 capsule by mouth daily.     . ondansetron (ZOFRAN-ODT) 8 MG disintegrating tablet Take 1 tablet (8 mg total) by mouth every 8 (eight) hours as needed for nausea or vomiting. 20 tablet 0  . pantoprazole (PROTONIX) 40 MG tablet TAKE 1 TABLET BY MOUTH EVERY DAY 30 tablet 0  . Polyethyl Glycol-Propyl Glycol (SYSTANE ULTRA OP) Place 1 drop into both eyes 2 (two) times daily.    . propranolol ER (INDERAL LA) 80 MG 24 hr capsule Take 80 mg by mouth daily.    Marland Kitchen sulfamethoxazole-trimethoprim (BACTRIM DS) 800-160 MG tablet Take 1 tablet by mouth 2  (two) times daily.    . vitamin C (ASCORBIC ACID) 500 MG tablet Take 500 mg by mouth daily.    . Probiotic Product (PROBIOTIC PO) Take 1 capsule by mouth daily.      No current facility-administered medications for this visit.     Allergies  Allergen Reactions  . Dilaudid [Hydromorphone] Shortness Of Breath, Nausea And Vomiting and Other (See Comments)    "Depressed my respiratory drive and made me hypotensive"  . Fentanyl Shortness Of Breath and Nausea And Vomiting    "Depressed my respiratory drive and made me hypotensive"  . Latex Anaphylaxis, Rash and  Other (See Comments)    Caused redness also; BLISTERS THE SKIN (a bite block did this)  . Morphine Shortness Of Breath and Nausea And Vomiting    "Depressed my respiratory drive and made me hypotensive"  . Morphine And Related Shortness Of Breath, Nausea And Vomiting and Other (See Comments)    "Depressed my respiratory drive and made me hypotensive"  . Aspirin Nausea Only    Irritates the stomach  . Codeine Nausea And Vomiting  . Nsaids Nausea Only and Other (See Comments)    These irritate the stomach  . Other Other (See Comments)    Patient is sensitive to "binders" or "fillers" in meds  . Oxycodone Nausea Only and Other (See Comments)    Requires anti-nausea medication to tolerate  . Statins Other (See Comments)    "Made my joints hurt"  . Verapamil Other (See Comments)    "Threw me into V-Tach"  . Adhesive [Tape] Rash and Other (See Comments)    Leaves an "imprint" on the skin    Review of Systems:  neg     Physical Exam:    Ht 5\' 4"  (1.626 m)   Wt 130 lb (59 kg)   BMI 22.31 kg/m  Filed Weights   09/14/18 1118  Weight: 130 lb (59 kg)  Not examined since it was a tele-visit  Data Reviewed: I have personally reviewed following labs and imaging studies  CBC: CBC Latest Ref Rng & Units 05/10/2018 05/05/2018 09/25/2016  WBC 4.0 - 10.5 K/uL 10.5 11.6(H) 8.0  Hemoglobin 12.0 - 15.0 g/dL 14.712.6 82.914.0 56.212.0  Hematocrit  36.0 - 46.0 % 37.8 42.8 35.3(L)  Platelets 150.0 - 400.0 K/uL 258.0 231 228    CMP: CMP Latest Ref Rng & Units 05/10/2018 05/05/2018 09/25/2016  Glucose 70 - 99 mg/dL 130(Q122(H) 657(Q188(H) 469(G145(H)  BUN 6 - 23 mg/dL 15 17 12   Creatinine 0.40 - 1.20 mg/dL 2.951.00 2.84(X1.37(H) 3.240.91  Sodium 135 - 145 mEq/L 133(L) 135 131(L)  Potassium 3.5 - 5.1 mEq/L 3.6 4.5 3.7  Chloride 96 - 112 mEq/L 100 100 103  CO2 19 - 32 mEq/L 26 24 20(L)  Calcium 8.4 - 10.5 mg/dL 9.6 40.110.3 9.0  Total Protein 6.0 - 8.3 g/dL 7.3 7.8 6.9  Total Bilirubin 0.2 - 1.2 mg/dL 0.5 1.1 0.9  Alkaline Phos 39 - 117 U/L 69 73 75  AST 0 - 37 U/L 13 21 20   ALT 0 - 35 U/L 10 20 17    CT 05/05/2018 IMPRESSION: Bowel wall thickening of the mid transverse colon through proximal sigmoid colon consistent with colitis; differential diagnosis would include infection and inflammatory bowel disease, not excluded but considered less likely. Minimal sigmoid diverticulosis without evidence of diverticulitis.  On review of the CT-some stool in the right colon, has aortic atherosclerosis, blood vessels seem to be patent   This service was provided via telemedicine.  The patient was located at home.  The provider was located in office.  The patient did consent to this telephone visit and is aware of possible charges through their insurance for this visit.   Time spent on call/coordination of care: 25 min      Edman Circleaj Darnise Montag, MD 09/14/2018, 2:11 PM  Cc: Leane CallNodal, Jr Reinaldo, PA-C

## 2018-09-16 ENCOUNTER — Emergency Department (HOSPITAL_COMMUNITY): Payer: Medicare Other

## 2018-09-16 ENCOUNTER — Other Ambulatory Visit: Payer: Self-pay

## 2018-09-16 ENCOUNTER — Ambulatory Visit (HOSPITAL_BASED_OUTPATIENT_CLINIC_OR_DEPARTMENT_OTHER): Admission: RE | Admit: 2018-09-16 | Payer: Medicare Other | Source: Ambulatory Visit

## 2018-09-16 ENCOUNTER — Telehealth: Payer: Self-pay | Admitting: Gastroenterology

## 2018-09-16 ENCOUNTER — Inpatient Hospital Stay (HOSPITAL_COMMUNITY)
Admission: EM | Admit: 2018-09-16 | Discharge: 2018-09-19 | DRG: 641 | Disposition: A | Discharge status: Home / Self Care | Payer: Medicare Other | Attending: Internal Medicine | Admitting: Internal Medicine

## 2018-09-16 DIAGNOSIS — Z800 Family history of malignant neoplasm of digestive organs: Secondary | ICD-10-CM

## 2018-09-16 DIAGNOSIS — Z1159 Encounter for screening for other viral diseases: Secondary | ICD-10-CM

## 2018-09-16 DIAGNOSIS — Z87442 Personal history of urinary calculi: Secondary | ICD-10-CM

## 2018-09-16 DIAGNOSIS — E871 Hypo-osmolality and hyponatremia: Secondary | ICD-10-CM

## 2018-09-16 DIAGNOSIS — Z79899 Other long term (current) drug therapy: Secondary | ICD-10-CM

## 2018-09-16 DIAGNOSIS — N390 Urinary tract infection, site not specified: Secondary | ICD-10-CM | POA: Diagnosis present

## 2018-09-16 DIAGNOSIS — E878 Other disorders of electrolyte and fluid balance, not elsewhere classified: Secondary | ICD-10-CM | POA: Diagnosis present

## 2018-09-16 DIAGNOSIS — Z90710 Acquired absence of both cervix and uterus: Secondary | ICD-10-CM

## 2018-09-16 DIAGNOSIS — Z7984 Long term (current) use of oral hypoglycemic drugs: Secondary | ICD-10-CM

## 2018-09-16 DIAGNOSIS — Z885 Allergy status to narcotic agent status: Secondary | ICD-10-CM

## 2018-09-16 DIAGNOSIS — Z91048 Other nonmedicinal substance allergy status: Secondary | ICD-10-CM

## 2018-09-16 DIAGNOSIS — E872 Acidosis: Secondary | ICD-10-CM | POA: Diagnosis present

## 2018-09-16 DIAGNOSIS — Z981 Arthrodesis status: Secondary | ICD-10-CM

## 2018-09-16 DIAGNOSIS — Z91040 Latex allergy status: Secondary | ICD-10-CM

## 2018-09-16 DIAGNOSIS — E861 Hypovolemia: Secondary | ICD-10-CM | POA: Diagnosis present

## 2018-09-16 DIAGNOSIS — K219 Gastro-esophageal reflux disease without esophagitis: Secondary | ICD-10-CM | POA: Diagnosis present

## 2018-09-16 DIAGNOSIS — E785 Hyperlipidemia, unspecified: Secondary | ICD-10-CM | POA: Diagnosis present

## 2018-09-16 DIAGNOSIS — I951 Orthostatic hypotension: Secondary | ICD-10-CM | POA: Diagnosis present

## 2018-09-16 DIAGNOSIS — Z888 Allergy status to other drugs, medicaments and biological substances status: Secondary | ICD-10-CM

## 2018-09-16 DIAGNOSIS — I10 Essential (primary) hypertension: Secondary | ICD-10-CM | POA: Diagnosis present

## 2018-09-16 DIAGNOSIS — I771 Stricture of artery: Secondary | ICD-10-CM | POA: Diagnosis present

## 2018-09-16 DIAGNOSIS — E119 Type 2 diabetes mellitus without complications: Secondary | ICD-10-CM | POA: Diagnosis present

## 2018-09-16 DIAGNOSIS — I493 Ventricular premature depolarization: Secondary | ICD-10-CM | POA: Diagnosis present

## 2018-09-16 DIAGNOSIS — M1990 Unspecified osteoarthritis, unspecified site: Secondary | ICD-10-CM | POA: Diagnosis present

## 2018-09-16 DIAGNOSIS — I774 Celiac artery compression syndrome: Secondary | ICD-10-CM | POA: Diagnosis present

## 2018-09-16 DIAGNOSIS — N179 Acute kidney failure, unspecified: Secondary | ICD-10-CM | POA: Diagnosis present

## 2018-09-16 DIAGNOSIS — Z9049 Acquired absence of other specified parts of digestive tract: Secondary | ICD-10-CM

## 2018-09-16 HISTORY — DX: Hypo-osmolality and hyponatremia: E87.1

## 2018-09-16 LAB — URINALYSIS, ROUTINE W REFLEX MICROSCOPIC
Bilirubin Urine: NEGATIVE
Glucose, UA: NEGATIVE mg/dL
Hgb urine dipstick: NEGATIVE
Ketones, ur: NEGATIVE mg/dL
Leukocytes,Ua: NEGATIVE
Nitrite: NEGATIVE
Protein, ur: NEGATIVE mg/dL
Specific Gravity, Urine: 1.027 (ref 1.005–1.030)
pH: 6 (ref 5.0–8.0)

## 2018-09-16 LAB — CBC WITH DIFFERENTIAL/PLATELET
Abs Immature Granulocytes: 0.04 10*3/uL (ref 0.00–0.07)
Basophils Absolute: 0.1 10*3/uL (ref 0.0–0.1)
Basophils Relative: 1 %
Eosinophils Absolute: 0.3 10*3/uL (ref 0.0–0.5)
Eosinophils Relative: 5 %
HCT: 40.8 % (ref 36.0–46.0)
Hemoglobin: 13.9 g/dL (ref 12.0–15.0)
Immature Granulocytes: 1 %
Lymphocytes Relative: 25 %
Lymphs Abs: 1.7 10*3/uL (ref 0.7–4.0)
MCH: 30.1 pg (ref 26.0–34.0)
MCHC: 34.1 g/dL (ref 30.0–36.0)
MCV: 88.3 fL (ref 80.0–100.0)
Monocytes Absolute: 0.9 10*3/uL (ref 0.1–1.0)
Monocytes Relative: 14 %
Neutro Abs: 3.6 10*3/uL (ref 1.7–7.7)
Neutrophils Relative %: 54 %
Platelets: 258 10*3/uL (ref 150–400)
RBC: 4.62 MIL/uL (ref 3.87–5.11)
RDW: 13.1 % (ref 11.5–15.5)
WBC: 6.6 10*3/uL (ref 4.0–10.5)
nRBC: 0 % (ref 0.0–0.2)

## 2018-09-16 LAB — BASIC METABOLIC PANEL
Anion gap: 10 (ref 5–15)
Anion gap: 5 (ref 5–15)
BUN: 13 mg/dL (ref 8–23)
BUN: 13 mg/dL (ref 8–23)
CO2: 21 mmol/L — ABNORMAL LOW (ref 22–32)
CO2: 22 mmol/L (ref 22–32)
Calcium: 9.7 mg/dL (ref 8.9–10.3)
Calcium: 9.5 mg/dL (ref 8.9–10.3)
Chloride: 94 mmol/L — ABNORMAL LOW (ref 98–111)
Chloride: 99 mmol/L (ref 98–111)
Creatinine, Ser: 1.27 mg/dL — ABNORMAL HIGH (ref 0.44–1.00)
Creatinine, Ser: 1.34 mg/dL — ABNORMAL HIGH (ref 0.44–1.00)
GFR calc Af Amer: 48 mL/min — ABNORMAL LOW (ref >60)
GFR calc Af Amer: 45 mL/min — ABNORMAL LOW (ref >60)
GFR calc non Af Amer: 42 mL/min — ABNORMAL LOW (ref >60)
GFR calc non Af Amer: 39 mL/min — ABNORMAL LOW (ref >60)
Glucose, Bld: 87 mg/dL (ref 70–99)
Glucose, Bld: 143 mg/dL — ABNORMAL HIGH (ref 70–99)
Potassium: 4.5 mmol/L (ref 3.5–5.1)
Potassium: 4.4 mmol/L (ref 3.5–5.1)
Sodium: 125 mmol/L — ABNORMAL LOW (ref 135–145)
Sodium: 126 mmol/L — ABNORMAL LOW (ref 135–145)

## 2018-09-16 LAB — COMPREHENSIVE METABOLIC PANEL
ALT: 45 U/L — ABNORMAL HIGH (ref 0–44)
AST: 27 U/L (ref 15–41)
Albumin: 4.2 g/dL (ref 3.5–5.0)
Alkaline Phosphatase: 103 U/L (ref 38–126)
Anion gap: 13 (ref 5–15)
BUN: 13 mg/dL (ref 8–23)
CO2: 19 mmol/L — ABNORMAL LOW (ref 22–32)
Calcium: 10.4 mg/dL — ABNORMAL HIGH (ref 8.9–10.3)
Chloride: 94 mmol/L — ABNORMAL LOW (ref 98–111)
Creatinine, Ser: 1.32 mg/dL — ABNORMAL HIGH (ref 0.44–1.00)
GFR calc Af Amer: 46 mL/min — ABNORMAL LOW (ref >60)
GFR calc non Af Amer: 40 mL/min — ABNORMAL LOW (ref >60)
Glucose, Bld: 128 mg/dL — ABNORMAL HIGH (ref 70–99)
Potassium: 5 mmol/L (ref 3.5–5.1)
Sodium: 126 mmol/L — ABNORMAL LOW (ref 135–145)
Total Bilirubin: 1.1 mg/dL (ref 0.3–1.2)
Total Protein: 8.1 g/dL (ref 6.5–8.1)

## 2018-09-16 LAB — TSH: TSH: 3.273 u[IU]/mL (ref 0.350–4.500)

## 2018-09-16 LAB — OSMOLALITY: Osmolality: 266 mOsm/kg — ABNORMAL LOW (ref 275–295)

## 2018-09-16 LAB — SARS CORONAVIRUS 2 BY RT PCR (HOSPITAL ORDER, PERFORMED IN ~~LOC~~ HOSPITAL LAB): SARS Coronavirus 2: NEGATIVE

## 2018-09-16 LAB — NA AND K (SODIUM & POTASSIUM), RAND UR
Potassium Urine: 17 mmol/L
Sodium, Ur: 59 mmol/L

## 2018-09-16 LAB — OSMOLALITY, URINE: Osmolality, Ur: 277 mOsm/kg — ABNORMAL LOW (ref 300–900)

## 2018-09-16 MED ORDER — ACETAMINOPHEN 500 MG PO TABS
500.0000 mg | ORAL_TABLET | Freq: Four times a day (QID) | ORAL | Status: DC | PRN
  Administered 2018-09-17: 500 mg via ORAL
  Administered 2018-09-17 – 2018-09-19 (×6): 1000 mg via ORAL
  Filled 2018-09-16: qty 2
  Filled 2018-09-16: qty 1
  Filled 2018-09-16 (×4): qty 2

## 2018-09-16 MED ORDER — LORATADINE 10 MG PO TABS: 10.0000 mg | ORAL_TABLET | Freq: Every day | ORAL | Status: DC | PRN

## 2018-09-16 MED ORDER — ONDANSETRON HCL 4 MG/2ML IJ SOLN
4.0000 mg | Freq: Once | INTRAMUSCULAR | Status: AC
  Administered 2018-09-16: 4 mg via INTRAVENOUS
  Filled 2018-09-16: qty 2

## 2018-09-16 MED ORDER — POLYETHYLENE GLYCOL 3350 17 G PO PACK
17.0000 g | PACK | Freq: Every day | ORAL | Status: DC
  Administered 2018-09-18: 10:00:00 17 g via ORAL
  Filled 2018-09-16 (×3): qty 1

## 2018-09-16 MED ORDER — PROPRANOLOL HCL ER 80 MG PO CP24
80.0000 mg | ORAL_CAPSULE | Freq: Every day | ORAL | Status: DC
  Administered 2018-09-17 – 2018-09-19 (×3): 80 mg via ORAL
  Filled 2018-09-16 (×3): qty 1

## 2018-09-16 MED ORDER — ACETAMINOPHEN 500 MG PO TABS
1000.0000 mg | ORAL_TABLET | Freq: Once | ORAL | Status: AC
  Administered 2018-09-16: 1000 mg via ORAL
  Filled 2018-09-16: qty 2

## 2018-09-16 MED ORDER — IOHEXOL 350 MG/ML SOLN
80.0000 mL | Freq: Once | INTRAVENOUS | Status: AC | PRN
  Administered 2018-09-16: 13:00:00 80 mL via INTRAVENOUS

## 2018-09-16 MED ORDER — DOCUSATE SODIUM 100 MG PO CAPS: 100.0000 mg | ORAL_CAPSULE | Freq: Every day | ORAL | Status: DC | PRN

## 2018-09-16 MED ORDER — ENOXAPARIN SODIUM 40 MG/0.4ML ~~LOC~~ SOLN
40.0000 mg | SUBCUTANEOUS | Status: DC
  Administered 2018-09-16 – 2018-09-18 (×3): 40 mg via SUBCUTANEOUS
  Filled 2018-09-16 (×3): qty 0.4

## 2018-09-16 MED ORDER — SODIUM CHLORIDE 0.9 % IV SOLN
INTRAVENOUS | Status: DC
  Administered 2018-09-16 – 2018-09-18 (×4): via INTRAVENOUS

## 2018-09-16 MED ORDER — SODIUM CHLORIDE 0.9 % IV BOLUS
1000.0000 mL | Freq: Once | INTRAVENOUS | Status: AC
  Administered 2018-09-16: 1000 mL via INTRAVENOUS

## 2018-09-16 MED ORDER — PANTOPRAZOLE SODIUM 40 MG PO TBEC
40.0000 mg | DELAYED_RELEASE_TABLET | Freq: Every day | ORAL | Status: DC
  Administered 2018-09-16 – 2018-09-19 (×4): 40 mg via ORAL
  Filled 2018-09-16 (×4): qty 1

## 2018-09-16 NOTE — ED Triage Notes (Signed)
Pt states she was sent by her doctor for abnormal labs - Na 122. Denies any other complaints at this time.

## 2018-09-16 NOTE — ED Notes (Signed)
Patient returned from CT

## 2018-09-16 NOTE — Discharge Summary (Addendum)
Name: Jasmine Farrell MRN: [id number] DOB: [birth year] 73 y.o. PCP: Leane Call, PA-C  Date of Admission: 09/16/2018 11:04 AM Date of Discharge: 09/19/2018 Attending Physician: Anne Shutter, MD  Discharge Diagnosis: 1.  Hypovolemic hyponatremia 2.  Acute kidney injury 3.  Normal anion gap metabolic acidosis 4.  Type 2 diabetes mellitus  Discharge Medications: Allergies as of 09/19/2018      Reactions   Dilaudid [hydromorphone] Shortness Of Breath, Nausea And Vomiting, Other (See Comments)   "Depressed my respiratory drive and made me hypotensive"   Fentanyl Shortness Of Breath, Nausea And Vomiting   "Depressed my respiratory drive and made me hypotensive"   Latex Anaphylaxis, Rash, Other (See Comments)   Caused redness also; BLISTERS THE SKIN (a bite block did this)   Morphine And Related Shortness Of Breath, Nausea And Vomiting, Other (See Comments)   "Depressed my respiratory drive and made me hypotensive"   Aspirin Nausea Only   Irritates the stomach   Codeine Nausea And Vomiting   Nsaids Nausea Only, Other (See Comments)   These irritate the stomach   Other Other (See Comments)   Patient is sensitive to "binders" or "fillers" in meds   Oxycodone Nausea Only, Other (See Comments)   Requires anti-nausea medication to tolerate   Statins Other (See Comments)   "Made my joints hurt"   Verapamil Other (See Comments)   "Threw me into V-Tach"   Adhesive [tape] Rash, Other (See Comments)   Leaves an "imprint" on the skin      Medication List    TAKE these medications   acetaminophen 500 MG tablet Commonly known as:  TYLENOL Take 500-1,000 mg by mouth every 6 (six) hours as needed for mild pain or headache.   B-12 2500 MCG Tabs Take 2,500 mcg by mouth daily as needed (to boost the immune system). Sublingual   docusate sodium 100 MG capsule Commonly known as:  COLACE Take 1 capsule (100 mg total) by mouth daily as needed for mild constipation.   lidocaine  5 % Commonly known as:  LIDODERM Place 1 patch onto the skin daily as needed (for pain). Remove & Discard patch within 12 hours or as directed by MD   linaclotide 72 MCG capsule Commonly known as:  Linzess Take 1 capsule (72 mcg total) by mouth daily before breakfast. Notes to patient:  09/19/2018   loratadine 10 MG tablet Commonly known as:  CLARITIN Take 10 mg by mouth daily as needed for allergies or rhinitis.   metFORMIN 500 MG tablet Commonly known as:  GLUCOPHAGE Take 250-500 mg by mouth See admin instructions. Take 250 mg by mouth in the morning before breakfast and 500 mg with the largest meal of the day Notes to patient:  09/19/2018   ondansetron 8 MG tablet Commonly known as:  Zofran Take 1 tablet (8 mg total) by mouth every 8 (eight) hours as needed for nausea or vomiting.   OSTEO BI-FLEX ADV TRIPLE ST PO Take 1 capsule by mouth daily. Notes to patient:  09/19/2018   pantoprazole 40 MG tablet Commonly known as:  PROTONIX Take 1 tablet (40 mg total) by mouth daily. What changed:  Another medication with the same name was removed. Continue taking this medication, and follow the directions you see here. Notes to patient:  09/20/2018   PEPCID AC PO Take 1 tablet by mouth at bedtime. Notes to patient:  09/19/2018   polyethylene glycol 17 g packet Commonly known as:  MIRALAX / GLYCOLAX  Take 17 g by mouth daily as needed. What changed:    when to take this  reasons to take this   propranolol ER 80 MG 24 hr capsule Commonly known as:  INDERAL LA Take 80 mg by mouth daily. Notes to patient:  09/20/2018   SYSTANE ULTRA OP Place 1 drop into both eyes 2 (two) times daily. Notes to patient:  09/19/2018   vitamin C 500 MG tablet Commonly known as:  ASCORBIC ACID Take 500 mg by mouth daily. Notes to patient:  09/19/2018       Disposition and follow-up:   Jasmine Farrell was discharged from [facility] in [place] condition.  At the hospital  follow up visit please address:  1.  Hypovolemic hyponatremia: Resolved with IV fluids       Acute kidney injury likely prerenal: Resolved with IV fluid resuscitation       Normal anion gap metabolic acidosis: Persistent on laboratory exams.  Could be secondary to renal tubular acidosis vs diarrhea  2.  Labs / imaging needed at time of follow-up: BMP, baseline ACTH  3.  Pending labs/ test needing follow-up: None  Follow-up Appointments: Follow-up Information    Nodal, Joline Salt, PA-C.   Specialty:  Physician Assistant Contact information: [address] [place] [address] [phone]           Hospital Course by problem list: 1.  Hypovolemic hyponatremia  Jasmine Farrell is a 73 year old woman with type 2 diabetes mellitus, PVCs, GERD and recently diagnosed colitis (thought to be ischemic in nature) who presented to the hospital on September 16, 2018 after she was found to have an abnormal laboratory values.  She was found to have significant hyponatremia with serum sodium of 121.  Her vital signs were unremarkable.  Her symptoms included weakness, dizziness, nausea.  Laboratory findings showed serum osmolality of 266, urine sodium of 59, urine osmolality of 277.  This was suggestive of inappropriate urinary concentration, raising concern for adrenal insufficiency.  Initial serum cortisol was low at 4.7.  However her ACTH stim test was normal with cortisol rise from 13.5 to 37.2.  She was treated with normal saline with appropriately gradual response.  At discharge, serum sodium was 134.  2.  Acute kidney injury: On arrival to the hospital, the serum creatinine of 1.3 from her baseline of 1.  This resolved with IV fluids.  At discharge, serum creatinine was 1.   3.  Normal anion gap metabolic acidosis: Her serum bicarb during this admission range from 18- 22.  This could be secondary to diarrhea or renal tubular acidosis as her urine pH was greater than 5.5.   4.  Type 2  diabetes mellitus: She was managed with sliding scale insulin.  5. ?  Ischemic colitis: This was being worked up outpatient and she was scheduled to undergo a CT angiography abdomen and pelvis.  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  6. GERD: Continue PPI  7. History of PVCs: Continue propranolol  Discharge Vitals:   BP 113/76 (BP Location: Right Arm)   Pulse 70   Temp 97.9 F (36.6 C) (Oral)   Resp 18   Ht  (1.651 m)   Wt 60.5 kg   SpO2 100%   BMI 22.20 kg/m   Pertinent Labs, Studies, and Procedures:  CT ANGIOGRAPHY ABDOMEN AND PELVIS WITH CONTRAST  TECHNIQUE: Multidetector CT imaging of the  abdomen and pelvis was performed using the standard protocol during bolus administration of intravenous contrast. Multiplanar reconstructed images and MIPs were obtained and reviewed to evaluate the vascular anatomy.  CONTRAST:  80mL OMNIPAQUE IOHEXOL 350 MG/ML SOLN  COMPARISON:  CT of the abdomen and pelvis on 05/05/2018  FINDINGS: VASCULAR  Aorta: The abdominal aorta demonstrates scattered plaque without evidence of aneurysm or dissection.  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 There also likely is some component of atherosclerosis. On the sagittal reconstructions maximal degree of focal narrowing approaches 75-80%.  SMA: Normally patent.  Distal branch vessels appear widely patent.  Renals: Bilateral single renal arteries demonstrate normal patency.  IMA: Normally patent.  Inflow: Normally patent bilateral iliac arteries without aneurysm or significant obstructive disease.  Proximal Outflow: Normally patent bilateral common femoral arteries and femoral bifurcations.  Veins: Venous phase imaging was also performed demonstrating normally patent venous structures  including the portal vein, mesenteric veins, splenic vein, renal veins, IVC, iliac veins and common femoral veins.  Review of the MIP images confirms the above findings.  NON-VASCULAR  Lower chest: Mild bibasilar scarring.  Hepatobiliary: No focal liver abnormality is seen. Status post cholecystectomy. No biliary dilatation.  Pancreas: Unremarkable. No pancreatic ductal dilatation or surrounding inflammatory changes.  Spleen: Normal in size without focal abnormality.  Adrenals/Urinary Tract: Adrenal glands are unremarkable. Kidneys are normal, without renal calculi, focal lesion, or hydronephrosis. Bladder is unremarkable.  Stomach/Bowel: Bowel shows no evidence of obstruction, ileus, inflammation or lesion. No free air identified.  Lymphatic: No significant vascular findings are present. No enlarged abdominal or pelvic lymph nodes.  Reproductive: Status post hysterectomy. No adnexal masses.  Other: No abdominal wall hernia or abnormality. No abdominopelvic ascites.  Musculoskeletal: No acute or significant osseous findings.  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 Configuration of stenosis is felt to be mostly on the basis of arcuate ligament compression, although there likely is also some atherosclerosis present. Maximal narrowing approaches 75-80%. The SMA and IMA are normally patent.  NON-VASCULAR  No significant nonvascular findings.   BMP Latest Ref Rng & Units 09/19/2018 09/18/2018 09/17/2018  Glucose 70 - 99 mg/dL 295(M136(H) 841(L117(H) 244(W115(H)  BUN 8 - 23 mg/dL 11 12 15   Creatinine 0.44 - 1.00 mg/dL 1.021.00 7.250.93 3.66(Y1.24(H)  Sodium 135 - 145 mmol/L 134(L) 136 133(L)  Potassium 3.5 - 5.1 mmol/L 4.1 4.4 4.6  Chloride 98 - 111 mmol/L 105 111 106  CO2 22 - 32 mmol/L 19(L) 18(L) 21(L)  Calcium 8.9 - 10.3 mg/dL 9.5 4.0(H8.7(L) 4.7(Q8.7(L)   Serum osm 266  LFTs normal except  initial ALT 45  AM Cortisol 4.6 ACTH stim test: initial 13.5, 30 min 30.2, 60 min 37.2  UA spec grav 1.027, pH 6.0 Urine osm 277 Urine Na 59 Urine K 17 Urine Cl 71  Discharge Instructions: Discharge Instructions    Diet - low sodium heart healthy   Complete by:  As directed    Discharge instructions   Complete by:  As directed    Jasmine Farrell,   It was a pleasure taking care of you here at the hospital.  You were admitted because of weakness and low sodium in your blood.  From our laboratory work-up, we thought this was likely due to dehydration and low volume in your body.  Your  sodium level came back normal to give you IV fluids.  We also did lab work to check your adrenal glands and this was normal.  I would advise that you follow-up with your endocrinologist and gastroenterologist as we discussed.  Take care!Dr. Dortha Schwalbe   Increase activity slowly   Complete by:  As directed       Signed: Yvette Rack, MD 09/19/2018, 12:05 PM   Pager: [phone] IMTS PGY-1   Internal Medicine Attending Note:  I saw and examined the patient on the day of discharge. I reviewed and agree with the discharge summary written by the house staff.  Jessy Oto, M.D., Ph.D.

## 2018-09-16 NOTE — ED Notes (Addendum)
ED TO INPATIENT HANDOFF REPORT  ED Nurse Name and Phone #: Alycia Rossetti [phone]  S Name/Age/Gender Jasmine Farrell 73 y.o. female Room/Bed: 042C/042C  Code Status   Code Status: Full Code  Home/SNF/Other Home Patient oriented to: self, place, time and situation Is this baseline? Yes   Triage Complete: Triage complete  Chief Complaint abn labs  Triage Note Pt states she was sent by her doctor for abnormal labs - Na 122. Denies any other complaints at this time.   Allergies Allergies  Allergen Reactions  . Dilaudid [Hydromorphone] Shortness Of Breath, Nausea And Vomiting and Other (See Comments)    "Depressed my respiratory drive and made me hypotensive"  . Fentanyl Shortness Of Breath and Nausea And Vomiting    "Depressed my respiratory drive and made me hypotensive"  . Latex Anaphylaxis, Rash and Other (See Comments)    Caused redness also; BLISTERS THE SKIN (a bite block did this)  . Morphine And Related Shortness Of Breath, Nausea And Vomiting and Other (See Comments)    "Depressed my respiratory drive and made me hypotensive"  . Aspirin Nausea Only    Irritates the stomach  . Codeine Nausea And Vomiting  . Nsaids Nausea Only and Other (See Comments)    These irritate the stomach  . Other Other (See Comments)    Patient is sensitive to "binders" or "fillers" in meds  . Oxycodone Nausea Only and Other (See Comments)    Requires anti-nausea medication to tolerate  . Statins Other (See Comments)    "Made my joints hurt"  . Verapamil Other (See Comments)    "Threw me into V-Tach"  . Adhesive [Tape] Rash and Other (See Comments)    Leaves an "imprint" on the skin    Level of Care/Admitting Diagnosis ED Disposition    ED Disposition Condition Comment   Admit  Hospital Area: [facility] [[id number]]  Level of Care: Med-Surg [16]  Covid Evaluation: Screening Protocol (No Symptoms)  Diagnosis: Hyponatremia [478295]  Admitting Physician: Anne Shutter [[id number]]  Attending Physician: Anne Shutter [[id number]]  PT Class (Do Not Modify): Observation [104]  PT Acc Code (Do Not Modify): Observation [10022]       B Medical/Surgery History Past Medical History:  Diagnosis Date  . Anemia   . Anginal pain (HCC)   . Arthritis   . Bladder infection   . Colitis   . Complication of anesthesia    narrow airway per pt can have a bp drop and hard to wake up  . Diabetes mellitus without complication (HCC)   . Diffuse cystic mastopathy   . Diverticulosis   . Family history of adverse reaction to anesthesia    brother and sister hard to wake up  . GERD (gastroesophageal reflux disease)   . Headache   . Heart murmur   . History of kidney stones   . Hyperlipidemia   . Mitral valve disorder    MVP  . Mitral valve prolapse   . Paroxysmal atrial tachycardia (HCC)   . PONV (postoperative nausea and vomiting)   . PSVT (paroxysmal supraventricular tachycardia) (HCC)   . PVC's (premature ventricular contractions)   . SVT (supraventricular tachycardia) (HCC)   . Varicose veins    Past Surgical History:  Procedure Laterality Date  . ABDOMINAL HYSTERECTOMY    . ANTERIOR AND POSTERIOR VAGINAL REPAIR    . ANTERIOR CERVICAL DECOMP/DISCECTOMY FUSION N/A 08/19/2016   Procedure: ANTERIOR CERVICAL DECOMPRESSION/DISCECTOMY FUSION CERVICAL FOUR- CERVICAL FIVE, CERVICAL FIVE-  CERVICAL SIX;  Surgeon: Tressie Stalker, MD;  Location: [facility] OR;  Service: Neurosurgery;  Laterality: N/A;  ANTERIOR CERVICAL DECOMPRESSION/DISCECTOMY FUSION CERVICAL 4- CERVICAL 5, CERVICAL 5- CERVICAL6  . APPENDECTOMY    . BREAST LUMPECTOMY    . CHOLECYSTECTOMY    . ELBOW SURGERY Right   . HERNIA REPAIR       A IV Location/Drains/Wounds Patient Lines/Drains/Airways Status   Active Line/Drains/Airways    Name:   Placement date:   Placement time:   Site:   Days:   Peripheral IV 09/16/18 Right Antecubital   09/16/18    1136    Antecubital   less than 1   Incision (Closed)  08/19/16 Neck   08/19/16    1411     758          Intake/Output Last 24 hours No intake or output data in the 24 hours ending 09/16/18 1645  Labs/Imaging Results for orders placed or performed during the hospital encounter of 09/16/18 (from the past 48 hour(s))  CBC with Differential/Platelet     Status: None   Collection Time: 09/16/18 11:08 AM  Result Value Ref Range   WBC 6.6 4.0 - 10.5 K/uL   RBC 4.62 3.87 - 5.11 MIL/uL   Hemoglobin 13.9 12.0 - 15.0 g/dL   HCT 25.4 98.2 - 64.1 %   MCV 88.3 80.0 - 100.0 fL   MCH 30.1 26.0 - 34.0 pg   MCHC 34.1 30.0 - 36.0 g/dL   RDW 58.3 09.4 - 07.6 %   Platelets 258 150 - 400 K/uL   nRBC 0.0 0.0 - 0.2 %   Neutrophils Relative % 54 %   Neutro Abs 3.6 1.7 - 7.7 K/uL   Lymphocytes Relative 25 %   Lymphs Abs 1.7 0.7 - 4.0 K/uL   Monocytes Relative 14 %   Monocytes Absolute 0.9 0.1 - 1.0 K/uL   Eosinophils Relative 5 %   Eosinophils Absolute 0.3 0.0 - 0.5 K/uL   Basophils Relative 1 %   Basophils Absolute 0.1 0.0 - 0.1 K/uL   Immature Granulocytes 1 %   Abs Immature Granulocytes 0.04 0.00 - 0.07 K/uL    Comment: Performed at [facility], [address], [place], [place] [address]  Comprehensive metabolic panel     Status: Abnormal   Collection Time: 09/16/18 11:08 AM  Result Value Ref Range   Sodium 126 (L) 135 - 145 mmol/L   Potassium 5.0 3.5 - 5.1 mmol/L   Chloride 94 (L) 98 - 111 mmol/L   CO2 19 (L) 22 - 32 mmol/L   Glucose, Bld 128 (H) 70 - 99 mg/dL   BUN 13 8 - 23 mg/dL   Creatinine, Ser 1.03 (H) 0.44 - 1.00 mg/dL   Calcium 15.9 (H) 8.9 - 10.3 mg/dL   Total Protein 8.1 6.5 - 8.1 g/dL   Albumin 4.2 3.5 - 5.0 g/dL   AST 27 15 - 41 U/L   ALT 45 (H) 0 - 44 U/L   Alkaline Phosphatase 103 38 - 126 U/L   Total Bilirubin 1.1 0.3 - 1.2 mg/dL   GFR calc non Af Amer 40 (L) >60 mL/min   GFR calc Af Amer 46 (L) >60 mL/min   Anion gap 13 5 - 15    Comment: Performed at [facility], [address] [place], [place]  [address]  TSH     Status: None   Collection Time: 09/16/18 11:08 AM  Result Value Ref Range  TSH 3.273 0.350 - 4.500 uIU/mL    Comment: Performed by a 3rd Generation assay with a functional sensitivity of <=0.01 uIU/mL. Performed at [facility], [address], [place], [place] [address]   SARS Coronavirus 2 (CEPHEID - Performed in [facility] hospital lab), Hosp Order     Status: None   Collection Time: 09/16/18  3:27 PM  Result Value Ref Range   SARS Coronavirus 2 NEGATIVE NEGATIVE    Comment: (NOTE) If result is NEGATIVE SARS-CoV-2 target nucleic acids are NOT DETECTED. The SARS-CoV-2 RNA is generally detectable in upper and lower  respiratory specimens during the acute phase of infection. The lowest  concentration of SARS-CoV-2 viral copies this assay can detect is 250  copies / mL. A negative result does not preclude SARS-CoV-2 infection  and should not be used as the sole basis for treatment or other  patient management decisions.  A negative result may occur with  improper specimen collection / handling, submission of specimen other  than nasopharyngeal swab, presence of viral mutation(s) within the  areas targeted by this assay, and inadequate number of viral copies  (<250 copies / mL). A negative result must be combined with clinical  observations, patient history, and epidemiological information. If result is POSITIVE SARS-CoV-2 target nucleic acids are DETECTED. The SARS-CoV-2 RNA is generally detectable in upper and lower  respiratory specimens dur ing the acute phase of infection.  Positive  results are indicative of active infection with SARS-CoV-2.  Clinical  correlation with patient history and other diagnostic information is  necessary to determine patient infection status.  Positive results do  not rule out bacterial infection or co-infection with other viruses. If result is PRESUMPTIVE POSTIVE SARS-CoV-2 nucleic acids MAY BE PRESENT.   A presumptive  positive result was obtained on the submitted specimen  and confirmed on repeat testing.  While 2019 novel coronavirus  (SARS-CoV-2) nucleic acids may be present in the submitted sample  additional confirmatory testing may be necessary for epidemiological  and / or clinical management purposes  to differentiate between  SARS-CoV-2 and other Sarbecovirus currently known to infect humans.  If clinically indicated additional testing with an alternate test  methodology [phone]) is advised. The SARS-CoV-2 RNA is generally  detectable in upper and lower respiratory sp ecimens during the acute  phase of infection. The expected result is Negative. Fact Sheet for Patients:  BoilerBrush.com.cy Fact Sheet for Healthcare Providers: https://pope.com/ This test is not yet approved or cleared by the [place] FDA and has been authorized for detection and/or diagnosis of SARS-CoV-2 by FDA under an Emergency Use Authorization (EUA).  This EUA will remain in effect (meaning this test can be used) for the duration of the COVID-19 declaration under Section 564(b)(1) of the Act, 21 U.S.C. section 360bbb-3(b)(1), unless the authorization is terminated or revoked sooner. Performed at [facility], [address], [place], [place] [address]   Urinalysis, Routine w reflex microscopic     Status: Abnormal   Collection Time: 09/16/18  4:10 PM  Result Value Ref Range   Color, Urine STRAW (A) YELLOW   APPearance CLEAR CLEAR   Specific Gravity, Urine 1.027 1.005 - 1.030   pH 6.0 5.0 - 8.0   Glucose, UA NEGATIVE NEGATIVE mg/dL   Hgb urine dipstick NEGATIVE NEGATIVE   Bilirubin Urine NEGATIVE NEGATIVE   Ketones, ur NEGATIVE NEGATIVE mg/dL   Protein, ur NEGATIVE NEGATIVE mg/dL   Nitrite NEGATIVE NEGATIVE   Leukocytes,Ua NEGATIVE NEGATIVE  Comment: Performed at [facility], [address], [place], [place] [address]   Dg Chest 2 View  Result  Date: 09/16/2018 CLINICAL DATA:  Low sodium.  Dizziness.  Syncope. EXAM: CHEST - 2 VIEW COMPARISON:  Chest x-ray 07/29/2018. FINDINGS: Mediastinum and hilar structures normal. Mild bibasilar subsegmental atelectasis and or scarring again noted. No focal alveolar infiltrate. No pleural effusion or pneumothorax. Heart size stable, no pulmonary venous congestion. Prior cervical spine fusion. No acute bony abnormality. Old healed fracture left humerus. Surgical clips right upper quadrant. IMPRESSION: Mild bibasilar subsegmental atelectasis and or scarring again noted. No acute cardiopulmonary disease identified. Electronically Signed   By: Maisie Fus  Register   On: 09/16/2018 11:53   Ct Angio Abd/pel W And/or Wo Contrast  Result Date: 09/16/2018 CLINICAL DATA:  Generalized abdominal pain, weight loss, nausea and vomiting. EXAM: CT ANGIOGRAPHY ABDOMEN AND PELVIS WITH CONTRAST TECHNIQUE: Multidetector CT imaging of the abdomen and pelvis was performed using the standard protocol during bolus administration of intravenous contrast. Multiplanar reconstructed images and MIPs were obtained and reviewed to evaluate the vascular anatomy. CONTRAST:  80mL OMNIPAQUE IOHEXOL 350 MG/ML SOLN COMPARISON:  CT of the abdomen and pelvis on 05/05/2018 FINDINGS: VASCULAR Aorta: The abdominal aorta demonstrates scattered plaque without evidence of aneurysm or dissection. 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 There also likely is some component of atherosclerosis. On the sagittal reconstructions maximal degree of focal narrowing approaches 75-80%. SMA: Normally patent.  Distal branch vessels appear widely patent. Renals: Bilateral single renal arteries demonstrate normal patency. IMA: Normally patent. Inflow: Normally patent bilateral iliac arteries without aneurysm or significant obstructive  disease. Proximal Outflow: Normally patent bilateral common femoral arteries and femoral bifurcations. Veins: Venous phase imaging was also performed demonstrating normally patent venous structures including the portal vein, mesenteric veins, splenic vein, renal veins, IVC, iliac veins and common femoral veins. Review of the MIP images confirms the above findings. NON-VASCULAR Lower chest: Mild bibasilar scarring. Hepatobiliary: No focal liver abnormality is seen. Status post cholecystectomy. No biliary dilatation. Pancreas: Unremarkable. No pancreatic ductal dilatation or surrounding inflammatory changes. Spleen: Normal in size without focal abnormality. Adrenals/Urinary Tract: Adrenal glands are unremarkable. Kidneys are normal, without renal calculi, focal lesion, or hydronephrosis. Bladder is unremarkable. Stomach/Bowel: Bowel shows no evidence of obstruction, ileus, inflammation or lesion. No free air identified. Lymphatic: No significant vascular findings are present. No enlarged abdominal or pelvic lymph nodes. Reproductive: Status post hysterectomy. No adnexal masses. Other: No abdominal wall hernia or abnormality. No abdominopelvic ascites. Musculoskeletal: No acute or significant osseous findings. 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 Configuration of stenosis is felt to be mostly on the basis of arcuate ligament compression, although there likely is also some atherosclerosis present. Maximal narrowing approaches 75-80%. The SMA and IMA are normally patent. NON-VASCULAR No significant nonvascular findings. Electronically Signed   By: Irish Lack M.D.   On: 09/16/2018 14:02    Pending Labs Unresulted Labs (From admission, onward)    Start     Ordered   09/17/18 0500  Comprehensive metabolic panel  Tomorrow morning,   R     09/16/18 1626   09/16/18 2200  Basic metabolic panel  Once-Timed,   R     Comments:  This is not a duplicate. These are in addition to the lab  drawn at 1620    09/16/18 1621   09/16/18 1553  Osmolality, urine  Once,   R     09/16/18 1552   09/16/18 1553  Osmolality  Once,   R     09/16/18 1552   09/16/18 1553  Na and K (sodium & potassium), rand urine  Once,   R     09/16/18 1552   09/16/18 1536  Basic metabolic panel  ONCE - STAT,   R     09/16/18 1536          Vitals/Pain Today's Vitals   09/16/18 1129 09/16/18 1130 09/16/18 1204 09/16/18 1205  BP: 137/72 137/72    Pulse: 71 71    Resp: 18     Temp: 98.2 F (36.8 C)     TempSrc: Oral     SpO2: 97% 97%    Weight:    59 kg  Height:    5\' 4"  (1.626 m)  PainSc:   0-No pain     Isolation Precautions No active isolations  Medications Medications  enoxaparin (LOVENOX) injection 40 mg (has no administration in time range)  acetaminophen (TYLENOL) tablet 500-1,000 mg (has no administration in time range)  docusate sodium (COLACE) capsule 100 mg (has no administration in time range)  loratadine (CLARITIN) tablet 10 mg (has no administration in time range)  pantoprazole (PROTONIX) EC tablet 40 mg (has no administration in time range)  propranolol ER (INDERAL LA) 24 hr capsule 80 mg (has no administration in time range)  polyethylene glycol (MIRALAX / GLYCOLAX) packet 17 g (has no administration in time range)  0.9 %  sodium chloride infusion (has no administration in time range)  sodium chloride 0.9 % bolus 1,000 mL (1,000 mLs Intravenous New Bag/Given 09/16/18 1202)  ondansetron (ZOFRAN) injection 4 mg (4 mg Intravenous Given 09/16/18 1202)  acetaminophen (TYLENOL) tablet 1,000 mg (1,000 mg Oral Given 09/16/18 1158)  iohexol (OMNIPAQUE) 350 MG/ML injection 80 mL (80 mLs Intravenous Contrast Given 09/16/18 1304)    Mobility walks Low fall risk   Focused Assessments    R Recommendations: See Admitting Provider Note  Report given to: 6N RN  Additional Notes:

## 2018-09-16 NOTE — Telephone Encounter (Signed)
I spoke with Jasmine Farrell at [facility] regarding the patients recent lab work prior to her CTA. Patients Sodium was 121. After speaking with Dr. Chales Abrahams he ordered a repeat CMP and instructed that the patient hold off on CTA for now. He also instructed that the patient go to the Emergency Department if she is not feeling well with her Sodium being so low. Jasmine Farrell at the PCP office verbalized understanding of orders and stated that they would redraw blood and notify us of results.

## 2018-09-16 NOTE — H&P (Addendum)
Date: 09/16/2018               Patient Name:  Jasmine Farrell MRN: [id number]  DOB: [birth year] Age / Sex: 73 y.o., female   PCP: Leane Call, PA-C         Medical Service: Internal Medicine Teaching Service         Attending Physician: Dr. Sandre Kitty    First Contact: Dr. Dortha Schwalbe Pager: [phone]  Second Contact: Dr. Crista Elliot  Pager: [phone]       After Hours (After 5p/  First Contact Pager: [phone]  weekends / holidays): Second Contact Pager: [phone]   Chief Complaint: Hyponatremia  History of Present Illness: Ms. Burgard is a 73 year old very pleasant woman with type 2 diabetes mellitus, PVCs, GERD and recently diagnosed colitis (likely ischemic) who presented to [facility] emergency department on September 16, 2018 at the behest of her gastroenterologist, Dr. Chales Abrahams as she was noted to have hyponatremia on lab exams.  She reports that Dr. Chales Abrahams told her her sodium was 121 and was asked to present to the emergency department.  Ms. gladwin states that for the past 2 weeks she has been feeling weak, dizzy, nauseous and diaphoretic.  At the interim, she has had decreased oral intake but denies diarrhea, vomiting, confusion, seizure, hematochezia.  She reports that she works outdoors and does some yard work during the day but will however start to feel diaphoretic 10 minutes into her yard work and would have to rest.  She recently began having abdominal pain and started following up with gastroenterology.  Her pain is usually at the right side of her abdomen and is not related to food.  It is intermittent and unpredictable.  She also reports of taking Bactrim for a recently diagnosed urinary tract infection and she has been taking for about 2 weeks now.  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  She received 500 cc bolus of normal saline in the ED.  Meds:  Current Meds  Medication Sig  . acetaminophen (TYLENOL) 500 MG tablet Take 500-1,000 mg by mouth every 6 (six) hours as needed for mild pain or headache.   . Cyanocobalamin (B-12) 2500 MCG TABS Take 2,500 mcg by mouth daily as needed (to boost the immune system). Sublingual  . docusate sodium (COLACE) 100 MG capsule Take 1 capsule (100 mg total) by mouth 2 (two) times daily. (Patient taking differently: Take 100 mg by mouth daily as needed for mild constipation. )  . Famotidine (PEPCID AC PO) Take 1 tablet by mouth at bedtime.  . lidocaine (LIDODERM) 5 % Place 1 patch onto the skin daily as needed (for pain). Remove & Discard patch within 12 hours or as directed by MD   . loratadine (CLARITIN) 10 MG tablet Take 10 mg by mouth daily as needed for allergies or rhinitis.   . metFORMIN (GLUCOPHAGE) 500 MG tablet Take 250-500 mg by mouth See admin instructions. Take 250 mg by mouth in the morning before breakfast and 500 mg with the largest meal of the day  . Misc Natural Products (OSTEO BI-FLEX ADV TRIPLE ST PO) Take 1 capsule by mouth daily.   . ondansetron (ZOFRAN) 8 MG tablet Take 1 tablet (8 mg total) by mouth  every 8 (eight) hours as needed for nausea or vomiting.  . pantoprazole (PROTONIX) 40 MG tablet Take 1 tablet (40 mg total) by mouth daily.  Bertram Gala Glycol-Propyl Glycol (SYSTANE ULTRA OP) Place 1 drop into both eyes 2 (two) times daily.  . polyethylene glycol (MIRALAX / GLYCOLAX) 17 g packet Take 17 g by mouth daily.  . propranolol ER (INDERAL LA) 80 MG 24 hr capsule Take 80 mg by mouth daily.  . vitamin C (ASCORBIC ACID) 500 MG tablet Take 500 mg by mouth daily.     Allergies: Allergies as of 09/16/2018 - Review Complete 09/16/2018  Allergen Reaction Noted  . Dilaudid [hydromorphone] Shortness Of Breath, Nausea And Vomiting, and Other (See  Comments) 08/19/2016  . Fentanyl Shortness Of Breath and Nausea And Vomiting 05/05/2018  . Latex Anaphylaxis, Rash, and Other (See Comments) 01/02/2013  . Morphine and related Shortness Of Breath, Nausea And Vomiting, and Other (See Comments) 01/02/2013  . Aspirin Nausea Only 01/02/2013  . Codeine Nausea And Vomiting 01/02/2013  . Nsaids Nausea Only and Other (See Comments) 01/02/2013  . Other Other (See Comments) 05/05/2018  . Oxycodone Nausea Only and Other (See Comments) 05/05/2018  . Statins Other (See Comments) 01/02/2013  . Verapamil Other (See Comments) 05/05/2018  . Adhesive [tape] Rash and Other (See Comments) 05/05/2018   Past Medical History:  Diagnosis Date  . Anemia   . Anginal pain (HCC)   . Arthritis   . Bladder infection   . Colitis   . Complication of anesthesia    narrow airway per pt can have a bp drop and hard to wake up  . Diabetes mellitus without complication (HCC)   . Diffuse cystic mastopathy   . Diverticulosis   . Family history of adverse reaction to anesthesia    brother and sister hard to wake up  . GERD (gastroesophageal reflux disease)   . Headache   . Heart murmur   . History of kidney stones   . Hyperlipidemia   . Mitral valve disorder    MVP  . Mitral valve prolapse   . Paroxysmal atrial tachycardia (HCC)   . PONV (postoperative nausea and vomiting)   . PSVT (paroxysmal supraventricular tachycardia) (HCC)   . PVC's (premature ventricular contractions)   . SVT (supraventricular tachycardia) (HCC)   . 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  She lives in [place] with her husband who has been disabled for about 40 years now.  She denies EtOH, tobacco use, or illicit drug use.  Review of Systems: A complete ROS was negative except as per HPI.   Physical Exam: Blood pressure 137/72, pulse 71, temperature 98.2 F (36.8 C), temperature source Oral, resp. rate 18,  height 5\' 4"  (1.626 m), weight 59 kg, SpO2 97 %. Physical Exam Vitals signs and nursing note reviewed.  HENT:     Head: Normocephalic and atraumatic.     Mouth/Throat:     Comments: Somewhat dry oral mucosa. Neck:     Musculoskeletal: Neck supple.  Cardiovascular:     Rate and Rhythm: Normal rate and regular rhythm.     Heart sounds: No murmur.  Pulmonary:     Breath sounds: Normal breath sounds. No wheezing.  Abdominal:     General: Bowel sounds are normal.     Tenderness: There is no abdominal tenderness.  Musculoskeletal: Normal range of motion.  General: No deformity.     Right lower leg: No edema.     Left lower leg: No edema.  Neurological:     General: No focal deficit present.     Mental Status: She is alert and oriented to person, place, and time.     Cranial Nerves: No cranial nerve deficit.     Motor: No weakness.  Psychiatric:        Mood and Affect: Mood normal.        Behavior: Behavior normal.    CXR: personally reviewed my interpretation is unremarkable.  Assessment & Plan by Problem: Active Problems:   Hyponatremia  Ms. Zella BallWorkman is a 73 year old very pleasant woman with type 2 diabetes mellitus, PVCs, GERD and recently diagnosed colitis (likely ischemic) here for management of hyponatremia.  #Hyponatremia-likely hypovolemic: 2-week history of malaise, dizziness in the setting of diaphoresis while working outdoors, decreased p.o. intake.  CMP in the ED showed serum sodium of 126 (reports her PCP told her it was 121 yesterday).  On physical exams she had some more dry oral mucosa.  For the laboratory exams show serum osmole of 266, urine sodium of 59, urine osmole 277.  Other consideration for hyponatremia is Bactrim induced.  This is a rare occurrence, there was a report of Bactrim induced hyponatremia in the case study but he seems to be in the elderly population.  The mechanism is not quite clear.  Is not on SSRI, diuretics and does not appear to have  adrenal insufficiency.  He is status post 1 L normal saline infusion - Continue maintenance IVF with NS at 125 mL/hour - Follow-up BMP q6 hrs - Goal correction sNa 130-132 (11 am 09/17/2018) - Follow-up TSH - Follow up orthostatic vitals   #Acute kidney injury: Likely prerenal given recent decreased p.o. intake and diaphoresis. sCr 1.3 (baseline~1).  Status post 1 L normal saline - Follow BMP  #Normal anion gap hypochloremic metabolic acidosis: Likely related to hypovolemia and ongoing acute kidney injury. - Continue to monitor  #Type 2 diabetes mellitus: Takes metformin at home - Monitor CBGs  #GERD: Continue PPI  #History of PVCs: Continue propranolol  FEN: Replace electrolytes as needed, low-sodium diet VTE ppx: Subcutaneous Lovenox CODE STATUS: Full code  Dispo: Admit patient to Observation with expected length of stay less than 2 midnights.  Signed: Yvette RackAgyei, Moishy Laday K, MD 09/16/2018, 4:19 PM  Pager: [phone] [facility] PGY-1

## 2018-09-16 NOTE — Progress Notes (Signed)
Notified IM Dr on call  Lab result- Sodium 126 @ 2144. Instructed to continue fluids.

## 2018-09-16 NOTE — ED Notes (Signed)
Patient transported to CT 

## 2018-09-16 NOTE — Telephone Encounter (Signed)
April from the patient pcp office called and wanted to informed Dr.Gupta that the patient sodium level is low and as well as CRP was elevated 9.8. So she wanted to know if the patient is still needing to have the CTA that is schedule. She is needing a call back on her cell phone which is [phone] Soon so that she can inform the patient.

## 2018-09-17 DIAGNOSIS — R7989 Other specified abnormal findings of blood chemistry: Secondary | ICD-10-CM | POA: Diagnosis not present

## 2018-09-17 DIAGNOSIS — I10 Essential (primary) hypertension: Secondary | ICD-10-CM | POA: Diagnosis present

## 2018-09-17 DIAGNOSIS — Z888 Allergy status to other drugs, medicaments and biological substances status: Secondary | ICD-10-CM

## 2018-09-17 DIAGNOSIS — N179 Acute kidney failure, unspecified: Secondary | ICD-10-CM

## 2018-09-17 DIAGNOSIS — Z87442 Personal history of urinary calculi: Secondary | ICD-10-CM | POA: Diagnosis not present

## 2018-09-17 DIAGNOSIS — E119 Type 2 diabetes mellitus without complications: Secondary | ICD-10-CM | POA: Diagnosis present

## 2018-09-17 DIAGNOSIS — M1990 Unspecified osteoarthritis, unspecified site: Secondary | ICD-10-CM | POA: Diagnosis present

## 2018-09-17 DIAGNOSIS — I771 Stricture of artery: Secondary | ICD-10-CM

## 2018-09-17 DIAGNOSIS — I493 Ventricular premature depolarization: Secondary | ICD-10-CM

## 2018-09-17 DIAGNOSIS — K219 Gastro-esophageal reflux disease without esophagitis: Secondary | ICD-10-CM

## 2018-09-17 DIAGNOSIS — N390 Urinary tract infection, site not specified: Secondary | ICD-10-CM | POA: Diagnosis present

## 2018-09-17 DIAGNOSIS — I774 Celiac artery compression syndrome: Secondary | ICD-10-CM

## 2018-09-17 DIAGNOSIS — Z9049 Acquired absence of other specified parts of digestive tract: Secondary | ICD-10-CM | POA: Diagnosis not present

## 2018-09-17 DIAGNOSIS — Z1159 Encounter for screening for other viral diseases: Secondary | ICD-10-CM | POA: Diagnosis not present

## 2018-09-17 DIAGNOSIS — E872 Acidosis: Secondary | ICD-10-CM | POA: Diagnosis present

## 2018-09-17 DIAGNOSIS — Z981 Arthrodesis status: Secondary | ICD-10-CM | POA: Diagnosis not present

## 2018-09-17 DIAGNOSIS — I951 Orthostatic hypotension: Secondary | ICD-10-CM | POA: Diagnosis present

## 2018-09-17 DIAGNOSIS — Z79899 Other long term (current) drug therapy: Secondary | ICD-10-CM

## 2018-09-17 DIAGNOSIS — E785 Hyperlipidemia, unspecified: Secondary | ICD-10-CM | POA: Diagnosis present

## 2018-09-17 DIAGNOSIS — Z7984 Long term (current) use of oral hypoglycemic drugs: Secondary | ICD-10-CM | POA: Diagnosis not present

## 2018-09-17 DIAGNOSIS — E871 Hypo-osmolality and hyponatremia: Principal | ICD-10-CM

## 2018-09-17 DIAGNOSIS — Z885 Allergy status to narcotic agent status: Secondary | ICD-10-CM | POA: Diagnosis not present

## 2018-09-17 DIAGNOSIS — Z91048 Other nonmedicinal substance allergy status: Secondary | ICD-10-CM | POA: Diagnosis not present

## 2018-09-17 DIAGNOSIS — K529 Noninfective gastroenteritis and colitis, unspecified: Secondary | ICD-10-CM

## 2018-09-17 DIAGNOSIS — Z91040 Latex allergy status: Secondary | ICD-10-CM | POA: Diagnosis not present

## 2018-09-17 DIAGNOSIS — Z87440 Personal history of urinary (tract) infections: Secondary | ICD-10-CM

## 2018-09-17 DIAGNOSIS — E878 Other disorders of electrolyte and fluid balance, not elsewhere classified: Secondary | ICD-10-CM | POA: Diagnosis present

## 2018-09-17 DIAGNOSIS — Z886 Allergy status to analgesic agent status: Secondary | ICD-10-CM

## 2018-09-17 DIAGNOSIS — E861 Hypovolemia: Secondary | ICD-10-CM | POA: Diagnosis present

## 2018-09-17 HISTORY — DX: Celiac artery compression syndrome: I77.4

## 2018-09-17 HISTORY — DX: Stricture of artery: I77.1

## 2018-09-17 LAB — COMPREHENSIVE METABOLIC PANEL
ALT: 35 U/L (ref 0–44)
AST: 21 U/L (ref 15–41)
Albumin: 3.5 g/dL (ref 3.5–5.0)
Alkaline Phosphatase: 90 U/L (ref 38–126)
Anion gap: 9 (ref 5–15)
BUN: 13 mg/dL (ref 8–23)
CO2: 20 mmol/L — ABNORMAL LOW (ref 22–32)
Calcium: 9.1 mg/dL (ref 8.9–10.3)
Chloride: 99 mmol/L (ref 98–111)
Creatinine, Ser: 1.22 mg/dL — ABNORMAL HIGH (ref 0.44–1.00)
GFR calc Af Amer: 51 mL/min — ABNORMAL LOW (ref >60)
GFR calc non Af Amer: 44 mL/min — ABNORMAL LOW (ref >60)
Glucose, Bld: 141 mg/dL — ABNORMAL HIGH (ref 70–99)
Potassium: 4.3 mmol/L (ref 3.5–5.1)
Sodium: 128 mmol/L — ABNORMAL LOW (ref 135–145)
Total Bilirubin: 0.8 mg/dL (ref 0.3–1.2)
Total Protein: 6.6 g/dL (ref 6.5–8.1)

## 2018-09-17 LAB — GLUCOSE, CAPILLARY
Glucose-Capillary: 100 mg/dL — ABNORMAL HIGH (ref 70–99)
Glucose-Capillary: 239 mg/dL — ABNORMAL HIGH (ref 70–99)
Glucose-Capillary: 111 mg/dL — ABNORMAL HIGH (ref 70–99)

## 2018-09-17 LAB — BASIC METABOLIC PANEL
Anion gap: 9 (ref 5–15)
Anion gap: 9 (ref 5–15)
Anion gap: 6 (ref 5–15)
BUN: 14 mg/dL (ref 8–23)
BUN: 14 mg/dL (ref 8–23)
BUN: 15 mg/dL (ref 8–23)
CO2: 18 mmol/L — ABNORMAL LOW (ref 22–32)
CO2: 19 mmol/L — ABNORMAL LOW (ref 22–32)
CO2: 21 mmol/L — ABNORMAL LOW (ref 22–32)
Calcium: 8.9 mg/dL (ref 8.9–10.3)
Calcium: 8.6 mg/dL — ABNORMAL LOW (ref 8.9–10.3)
Calcium: 8.7 mg/dL — ABNORMAL LOW (ref 8.9–10.3)
Chloride: 102 mmol/L (ref 98–111)
Chloride: 103 mmol/L (ref 98–111)
Chloride: 106 mmol/L (ref 98–111)
Creatinine, Ser: 1.33 mg/dL — ABNORMAL HIGH (ref 0.44–1.00)
Creatinine, Ser: 1.41 mg/dL — ABNORMAL HIGH (ref 0.44–1.00)
Creatinine, Ser: 1.24 mg/dL — ABNORMAL HIGH (ref 0.44–1.00)
GFR calc Af Amer: 46 mL/min — ABNORMAL LOW (ref >60)
GFR calc Af Amer: 43 mL/min — ABNORMAL LOW (ref >60)
GFR calc Af Amer: 50 mL/min — ABNORMAL LOW (ref >60)
GFR calc non Af Amer: 40 mL/min — ABNORMAL LOW (ref >60)
GFR calc non Af Amer: 37 mL/min — ABNORMAL LOW (ref >60)
GFR calc non Af Amer: 43 mL/min — ABNORMAL LOW (ref >60)
Glucose, Bld: 221 mg/dL — ABNORMAL HIGH (ref 70–99)
Glucose, Bld: 229 mg/dL — ABNORMAL HIGH (ref 70–99)
Glucose, Bld: 115 mg/dL — ABNORMAL HIGH (ref 70–99)
Potassium: 4.5 mmol/L (ref 3.5–5.1)
Potassium: 4.5 mmol/L (ref 3.5–5.1)
Potassium: 4.6 mmol/L (ref 3.5–5.1)
Sodium: 129 mmol/L — ABNORMAL LOW (ref 135–145)
Sodium: 131 mmol/L — ABNORMAL LOW (ref 135–145)
Sodium: 133 mmol/L — ABNORMAL LOW (ref 135–145)

## 2018-09-17 LAB — CHLORIDE, URINE, RANDOM: Chloride Urine: 71 mmol/L

## 2018-09-17 MED ORDER — COSYNTROPIN 0.25 MG IJ SOLR
0.2500 mg | Freq: Once | INTRAMUSCULAR | Status: DC
  Filled 2018-09-17: qty 0.25

## 2018-09-17 MED ORDER — INSULIN ASPART 100 UNIT/ML ~~LOC~~ SOLN
0.0000 [IU] | Freq: Three times a day (TID) | SUBCUTANEOUS | Status: DC
  Administered 2018-09-17 – 2018-09-18 (×2): 3 [IU] via SUBCUTANEOUS

## 2018-09-17 MED ORDER — ONDANSETRON HCL 4 MG/2ML IJ SOLN
4.0000 mg | Freq: Once | INTRAMUSCULAR | Status: AC
  Administered 2018-09-17: 4 mg via INTRAVENOUS
  Filled 2018-09-17: qty 2

## 2018-09-17 MED ORDER — ONDANSETRON HCL 4 MG/2ML IJ SOLN
4.0000 mg | Freq: Three times a day (TID) | INTRAMUSCULAR | Status: DC | PRN
  Administered 2018-09-17 – 2018-09-19 (×5): 4 mg via INTRAVENOUS
  Filled 2018-09-17 (×5): qty 2

## 2018-09-17 NOTE — Progress Notes (Signed)
  Date: 09/17/2018  Patient name: Jasmine Farrell  Medical record number: [id number]  Date of birth: 01/21/1946   I have seen and evaluated this patient and I have discussed the plan of care with the house staff. Please see their note for complete details. I concur with their findings with the following additions/corrections:  Please see my separate attestation of the H&P from 09/16/2018.  Lenice Pressman, M.D., Ph.D. 09/17/2018, 5:59 PM

## 2018-09-17 NOTE — Plan of Care (Signed)
  Problem: Pain Managment: Goal: General experience of comfort will improve Outcome: Progressing   Problem: Safety: Goal: Ability to remain free from injury will improve Outcome: Progressing   Problem: Skin Integrity: Goal: Risk for impaired skin integrity will decrease Outcome: Progressing   

## 2018-09-17 NOTE — Progress Notes (Signed)
   Subjective:  Jasmine Farrell reports that she had a rough night. She began experiencing nausea and was unable to get any anti-emetics. She was concerned about why her metformin was not started and we assured her that we usually hold metformin in the hospital as standard practice. She indicates that she has a history of intra-articular steroid injection of the left shoulder. She tolerated breakfast well. She reports of a history of taking prednisone and took it for about a week in August. She began experiencing HTN, hyperglycemia afterwards so she stopped the medication.   Objective:  Vital signs in last 24 hours: Vitals:   09/16/18 1205 09/16/18 1824 09/16/18 1916 09/17/18 0500  BP:  (!) 102/46 132/68 112/83  Pulse:  66 68 68  Resp:  16 16 17   Temp:  98 F (36.7 C) 97.9 F (36.6 C) 97.8 F (36.6 C)  TempSrc:  Oral Oral Oral  SpO2:  97% 99% 98%  Weight: 59 kg 59 kg  59.2 kg  Height: 5\' 4"  (1.626 m) 5\' 5"  (1.651 m)     General: A/O x4, in no acute distress, afebrile, nondiaphoretic Cardio: RRR, no mrg's, no JVD Pulmonary: CTA bilaterally, no wheezing or crackles  Abdomen: Bowel sounds normal, soft, nontender  MSK: BLE nontender, nonedematous Psych: Appropriate affect, not depressed in appearance, engages well  Assessment/Plan:  Active Problems:   Hyponatremia  Jasmine Farrell is a 73 yr F with a PMHx notable for DMII, PVCs, GERD and probable ischemic colitis that was admitted for management and evaluation hyponatremia and worsening hypertension.  Hypotonic Hyponatremia, likely hypovolemic: Given the patient's 2-week history of malaise, dizziness in the setting of diaphoresis working outdoors, dizziness on standing, positive orthostatics, persistent hyponatremia, decreased serum osmolality that is responding to IV fluid administration and a physical exam consistent with decreased volume we are concerned that she has hypovolemic hypotonic hyponatremia.  Urine sodium and urine osmoles  mildly concentrated for hypovolemic state.  Uncertain if they are ESRD arranged by the IV fluid bolus received prior to obtaining these labs.  Regardless, I expect more significant concentration which could be the result of early adrenal insufficiency or and hypertension is less common. - We will order a.m. cortisol and ACTH stim test - Continue IV fluids at 150 mL's an hour, normal saline - Follow-up BMP every 6 hours - Goal correction 132 by 6PM - TSH normal  Elevated serum creatinine: Uncertain baseline as her creatinine was 1.37 on 01/23 of this year but decreased to 1.0 five days later..  No significant improvement since admission, now at 1.33 -Continue IV fluids, no signs of hypervolemia on exam  Normal anion gap metabolic acidosis: Denied diarrhea or large volume loses. 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 -Will order urine chloride to determine approximate urine ammonia levels  DM type II: Monitor CBGs consider SSI if indicated. Holding metformin  Code: Full Diet: Regular Fluids: As above DVT PPX: Enoxaparin Dispo: Anticipated discharge in approximately 1-2 day(s).   Kathi Ludwig, MD 09/17/2018, 6:48 AM Pager: Pager# [phone]

## 2018-09-17 NOTE — Plan of Care (Signed)
  Problem: Activity: Goal: Risk for activity intolerance will decrease Outcome: Progressing   Problem: Coping: Goal: Level of anxiety will decrease Outcome: Progressing   Problem: Clinical Measurements: Goal: Ability to maintain clinical measurements within normal limits will improve Outcome: Progressing

## 2018-09-18 ENCOUNTER — Encounter (HOSPITAL_COMMUNITY): Payer: Self-pay

## 2018-09-18 LAB — BASIC METABOLIC PANEL
Anion gap: 7 (ref 5–15)
BUN: 12 mg/dL (ref 8–23)
CO2: 18 mmol/L — ABNORMAL LOW (ref 22–32)
Calcium: 8.7 mg/dL — ABNORMAL LOW (ref 8.9–10.3)
Chloride: 111 mmol/L (ref 98–111)
Creatinine, Ser: 0.93 mg/dL (ref 0.44–1.00)
GFR calc Af Amer: >60 mL/min (ref >60)
GFR calc non Af Amer: >60 mL/min (ref >60)
Glucose, Bld: 117 mg/dL — ABNORMAL HIGH (ref 70–99)
Potassium: 4.4 mmol/L (ref 3.5–5.1)
Sodium: 136 mmol/L (ref 135–145)

## 2018-09-18 LAB — GLUCOSE, CAPILLARY
Glucose-Capillary: 210 mg/dL — ABNORMAL HIGH (ref 70–99)
Glucose-Capillary: 63 mg/dL — ABNORMAL LOW (ref 70–99)
Glucose-Capillary: 162 mg/dL — ABNORMAL HIGH (ref 70–99)
Glucose-Capillary: 111 mg/dL — ABNORMAL HIGH (ref 70–99)
Glucose-Capillary: 110 mg/dL — ABNORMAL HIGH (ref 70–99)
Glucose-Capillary: 127 mg/dL — ABNORMAL HIGH (ref 70–99)

## 2018-09-18 LAB — CORTISOL-AM, BLOOD: Cortisol - AM: 4.6 ug/dL — ABNORMAL LOW (ref 6.7–22.6)

## 2018-09-18 MED ORDER — INSULIN ASPART 100 UNIT/ML ~~LOC~~ SOLN: 0.0000 [IU] | Freq: Three times a day (TID) | SUBCUTANEOUS | Status: DC

## 2018-09-18 MED ORDER — COSYNTROPIN 0.25 MG IJ SOLR
0.2500 mg | Freq: Once | INTRAMUSCULAR | Status: AC
  Administered 2018-09-19: 0.25 mg via INTRAVENOUS
  Filled 2018-09-18: qty 0.25

## 2018-09-18 NOTE — Progress Notes (Signed)
   Subjective: The patient stated that she felt better but still very week, tired and unsure she will be able to care for herself at home. 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 Given her weakness and being unsteady she requests additional evaluation into the cause of her illness.  Unfortunately, it appears that overnight the ACTH simulation test was not completed due to the patient having questions.   In the future please feel free to page the overnight team as they will be able to answer these questions.  Objective:  Vital signs in last 24 hours: Vitals:   09/17/18 0500 09/17/18 1515 09/17/18 2145 09/18/18 0441  BP: 112/83 127/75 118/71 138/76  Pulse: 68 70 69 68  Resp: 17 18 18 20   Temp: 97.8 F (36.6 C) 97.7 F (36.5 C) 97.7 F (36.5 C) 98.1 F (36.7 C)  TempSrc: Oral Oral Oral Oral  SpO2: 98% 100% 98% 92%  Weight: 59.2 kg   59.5 kg  Height:       General: A/O x4, in no acute distress, afebrile, nondiaphoretic Cardio: RRR, no mrg's  Pulmonary: CTA bilaterally, no wheezing or crackles  Abdomen: Bowel sounds normal, soft, nontender  MSK: BLE nontender, nonedematous Psych: Appropriate affect, not depressed in appearance, engages well  Assessment/Plan:  Principal Problem:   Hyponatremia Active Problems:   Celiac artery stenosis [facility])  Mrs. Sweeten is a 62 yr F with a PMHx notable for DMII, PVCs, GERD and probable ischemic colitis that was admitted for management and evaluation hyponatremia and orthostatic hypotension. She was treated with IV fluids with marked improvement in her serum creatinine and symptoms.   A/P: Hypotonic Hyponatremia, likely hypovolemic: Continue to feel this may be related to mild intrinsic renal injury 2/2 to dehydration vs medication effect vs adrenal insufficiency. Hyponatremia resolved. Patient is agreeable to testing no need this to appropriately diagnose and prevent rapid recurrence of her symptoms. - Patient refused  ACTH stim test due to questions regarding medication side effects, MD unfortunately not notified. - 3 a.m. cortisol low at 4.6 but not singularly diagnostic absent additional stim test - Discontinued fluids sodium now normal - WILL REPEAT ACTH stim test, patient agreeable, CONTACT MD if there is any issue.  Elevated serum creatinine, likely AKI given improvement: Uncertain baseline as her creatinine was 1.37 on 01/23 of this year but decreased to 1.0 five days later.  Not really improved with creatinine 0.93.  Patient appears to be hydrated sufficiently but has a mild persistent acidosis. -Discontinue IV fluids -BMP in a.m. -Repeat BMP in 4 weeks as an outpatient  Normal anion gap metabolic acidosis: Denied diarrhea or large volume loses. 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  Given improvement in renal function RTA type II (proximal) remains possible but more likely adrenal insufficiency given cortisol.   -Recommend follow-up with the PCP if unable to complete the stim test on 06/08  DM type II: -Monitor CBGs consider SSI if indicated. Holding metformin will discharge  Code: Full Diet: Regular Fluids: As above DVT PPX: Enoxaparin Dispo: Anticipated discharge in approximately 0-1 day(s).   Kathi Ludwig, MD 09/18/2018, 7:13 AM Pager: Pager# [phone]

## 2018-09-18 NOTE — Progress Notes (Signed)
Pt's glucose 63. Patient asymptomatic but requested hot tea with cream and sugar and a regular gingerale. Will continue to monitor.

## 2018-09-19 LAB — BASIC METABOLIC PANEL
Anion gap: 10 (ref 5–15)
BUN: 11 mg/dL (ref 8–23)
CO2: 19 mmol/L — ABNORMAL LOW (ref 22–32)
Calcium: 9.5 mg/dL (ref 8.9–10.3)
Chloride: 105 mmol/L (ref 98–111)
Creatinine, Ser: 1 mg/dL (ref 0.44–1.00)
GFR calc Af Amer: >60 mL/min (ref >60)
GFR calc non Af Amer: 56 mL/min — ABNORMAL LOW (ref >60)
Glucose, Bld: 136 mg/dL — ABNORMAL HIGH (ref 70–99)
Potassium: 4.1 mmol/L (ref 3.5–5.1)
Sodium: 134 mmol/L — ABNORMAL LOW (ref 135–145)

## 2018-09-19 LAB — GLUCOSE, CAPILLARY
Glucose-Capillary: 135 mg/dL — ABNORMAL HIGH (ref 70–99)
Glucose-Capillary: 120 mg/dL — ABNORMAL HIGH (ref 70–99)
Glucose-Capillary: 181 mg/dL — ABNORMAL HIGH (ref 70–99)

## 2018-09-19 LAB — ACTH STIMULATION, 3 TIME POINTS
Cortisol, 30 Min: 30.2 ug/dL
Cortisol, 60 Min: 37.2 ug/dL
Cortisol, Base: 13.5 ug/dL

## 2018-09-19 MED ORDER — POLYETHYLENE GLYCOL 3350 17 G PO PACK: 17.0000 g | PACK | Freq: Every day | ORAL | 0 refills | Status: DC | PRN

## 2018-09-19 MED ORDER — DOCUSATE SODIUM 100 MG PO CAPS: 100.0000 mg | ORAL_CAPSULE | Freq: Every day | ORAL | 0 refills | Status: DC | PRN

## 2018-09-19 NOTE — Plan of Care (Signed)
  Problem: Education: Goal: Knowledge of General Education information will improve Description Including pain rating scale, medication(s)/side effects and non-pharmacologic comfort measures Outcome: Progressing   Problem: Health Behavior/Discharge Planning: Goal: Ability to manage health-related needs will improve Outcome: Progressing   

## 2018-09-19 NOTE — ED Provider Notes (Signed)
[facility]  [facility]GICAL Provider Note   CSN: [id number] Arrival date & time: 09/16/18  1045    History   Chief Complaint Chief Complaint  Patient presents with  . Abnormal Lab    HPI Jasmine Farrell is a 73 y.o. female.     HPI Patient is a 73 year old female sent to the emergency department for low sodium found as an outpatient.  She reports feeling generally weak.  Her sodium as an outpatient was 121.  No recent change in her medications.  She does report some decreased oral intake as of lately.  Denies chest pain shortness of breath.  No fevers or chills.  Denies nausea vomiting.  No other complaints at this time.   Past Medical History:  Diagnosis Date  . Anemia   . Anginal pain (HCC)   . Arthritis   . Bladder infection   . Colitis   . Complication of anesthesia    narrow airway per pt can have a bp drop and hard to wake up  . Diabetes mellitus without complication (HCC)   . Diffuse cystic mastopathy   . Diverticulosis   . Family history of adverse reaction to anesthesia    brother and sister hard to wake up  . GERD (gastroesophageal reflux disease)   . Headache   . Heart murmur   . History of kidney stones   . Hyperlipidemia   . Mitral valve disorder    MVP  . Mitral valve prolapse   . Paroxysmal atrial tachycardia (HCC)   . PONV (postoperative nausea and vomiting)   . PSVT (paroxysmal supraventricular tachycardia) (HCC)   . PVC's (premature ventricular contractions)   . SVT (supraventricular tachycardia) (HCC)   . Varicose veins     Patient Active Problem List   Diagnosis Date Noted  . 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 Hyponatremia 09/16/2018  . Cervical spondylosis with myelopathy and radiculopathy 08/19/2016  . Spider veins 02/08/2013  . Varicose veins of lower extremities with other complications 01/30/2013    Past Surgical History:  Procedure Laterality Date  . ABDOMINAL HYSTERECTOMY    . ANTERIOR AND  POSTERIOR VAGINAL REPAIR    . ANTERIOR CERVICAL DECOMP/DISCECTOMY FUSION N/A 08/19/2016   Procedure: ANTERIOR CERVICAL DECOMPRESSION/DISCECTOMY FUSION CERVICAL FOUR- CERVICAL FIVE, CERVICAL FIVE- CERVICAL SIX;  Surgeon: Tressie StalkerJenkins, Jeffrey, MD;  [facility];  Service: Neurosurgery;  Laterality: N/A;  ANTERIOR CERVICAL DECOMPRESSION/DISCECTOMY FUSION CERVICAL 4- CERVICAL 5, CERVICAL 5- CERVICAL6  . APPENDECTOMY    . BREAST LUMPECTOMY    . CHOLECYSTECTOMY    . ELBOW SURGERY Right   . HERNIA REPAIR       OB History   No obstetric history on file.      Home Medications    Prior to Admission medications   Medication Sig Start Date End Date Taking? Authorizing Provider  acetaminophen (TYLENOL) 500 MG tablet Take 500-1,000 mg by mouth every 6 (six) hours as needed for mild pain or headache.    Yes [provider]  Cyanocobalamin (B-12) 2500 MCG TABS Take 2,500 mcg by mouth daily as needed (to boost the immune system). Sublingual   Yes [provider]  docusate sodium (COLACE) 100 MG capsule Take 1 capsule (100 mg total) by mouth 2 (two) times daily. Patient taking differently: Take 100 mg by mouth daily as needed for mild constipation.  08/21/16  Yes Tressie StalkerJenkins, Jeffrey, MD  Famotidine (PEPCID AC PO) Take 1 tablet by mouth at bedtime.  Yes [provider]  lidocaine (LIDODERM) 5 % Place 1 patch onto the skin daily as needed (for pain). Remove & Discard patch within 12 hours or as directed by MD    Yes [provider]  loratadine (CLARITIN) 10 MG tablet Take 10 mg by mouth daily as needed for allergies or rhinitis.    Yes [provider]  metFORMIN (GLUCOPHAGE) 500 MG tablet Take 250-500 mg by mouth See admin instructions. Take 250 mg by mouth in the morning before breakfast and 500 mg with the largest meal of the day   Yes [provider]  Misc Natural Products (OSTEO BI-FLEX ADV TRIPLE ST PO) Take 1 capsule by mouth daily.    Yes [provider]  ondansetron (ZOFRAN) 8 MG tablet Take 1 tablet (8 mg total) by mouth every 8 (eight) hours as needed for nausea or vomiting. 09/14/18  Yes Lynann BolognaGupta, Rajesh, MD  pantoprazole (PROTONIX) 40 MG tablet Take 1 tablet (40 mg total) by mouth daily. 09/14/18  Yes Lynann BolognaGupta, Rajesh, MD  Polyethyl Glycol-Propyl Glycol (SYSTANE ULTRA OP) Place 1 drop into both eyes 2 (two) times daily.   Yes [provider]  polyethylene glycol (MIRALAX / GLYCOLAX) 17 g packet Take 17 g by mouth daily.   Yes [provider]  propranolol ER (INDERAL LA) 80 MG 24 hr capsule Take 80 mg by mouth daily.   Yes [provider]  vitamin C (ASCORBIC ACID) 500 MG tablet Take 500 mg by mouth daily.   Yes [provider]  linaclotide Karlene Einstein(LINZESS) 72 MCG capsule Take 1 capsule (72 mcg total) by mouth daily before breakfast. 09/14/18   Lynann BolognaGupta, Rajesh, MD  pantoprazole (PROTONIX) 40 MG tablet TAKE 1 TABLET BY MOUTH EVERY DAY 09/16/18   Lynann BolognaGupta, Rajesh, MD    Family History Family History  Problem Relation Age of Onset  . Esophageal cancer Father   . Diverticulitis Sister   . Colon cancer Neg Hx     Social History Social History   Tobacco Use  . Smoking status: Never Smoker  . Smokeless tobacco: Never Used  Substance Use Topics  . Alcohol use: No  . Drug use: No     Allergies   Dilaudid [hydromorphone]; Fentanyl; Latex; Morphine and related; Aspirin; Codeine; Nsaids; Other; Oxycodone; Statins; Verapamil; and Adhesive [tape]   Review of Systems Review of Systems  All other systems reviewed and are negative.    Physical Exam Updated Vital Signs BP 113/76 (BP Location: Right Arm)   Pulse 70   Temp 97.9 F (36.6 C) (Oral)   Resp 18   Ht 5\' 5"  (1.651 m)   Wt 60.5 kg   SpO2 100%   BMI 22.20 kg/m   Physical Exam Vitals signs and nursing note reviewed.  Constitutional:      General: She is not in acute distress.    Appearance: She is well-developed.  HENT:     Head:  Normocephalic and atraumatic.  Neck:     Musculoskeletal: Normal range of motion.  Cardiovascular:     Rate and Rhythm: Normal rate and regular rhythm.     Heart sounds: Normal heart sounds.  Pulmonary:     Effort: Pulmonary effort is normal.     Breath sounds: Normal breath sounds.  Abdominal:     General: There is no distension.     Palpations: Abdomen is soft.     Tenderness: There is no abdominal tenderness.  Musculoskeletal: Normal range of motion.  Skin:  General: Skin is warm and dry.  Neurological:     Mental Status: She is alert and oriented to person, place, and time.  Psychiatric:        Judgment: Judgment normal.      ED Treatments / Results  Labs (all labs ordered are listed, but only abnormal results are displayed) Labs Reviewed  COMPREHENSIVE METABOLIC PANEL - Abnormal; Notable for the following components:      Result Value   Sodium 126 (*)    Chloride 94 (*)    CO2 19 (*)    Glucose, Bld 128 (*)    Creatinine, Ser 1.32 (*)    Calcium 10.4 (*)    ALT 45 (*)    GFR calc non Af Amer 40 (*)    GFR calc Af Amer 46 (*)    All other components within normal limits  URINALYSIS, ROUTINE W REFLEX MICROSCOPIC - Abnormal; Notable for the following components:   Color, Urine STRAW (*)    All other components within normal limits  BASIC METABOLIC PANEL - Abnormal; Notable for the following components:   Sodium 125 (*)    Chloride 94 (*)    CO2 21 (*)    Creatinine, Ser 1.27 (*)    GFR calc non Af Amer 42 (*)    GFR calc Af Amer 48 (*)    All other components within normal limits  OSMOLALITY, URINE - Abnormal; Notable for the following components:   Osmolality, Ur 277 (*)    All other components within normal limits  OSMOLALITY - Abnormal; Notable for the following components:   Osmolality 266 (*)    All other components within normal limits  BASIC METABOLIC PANEL - Abnormal; Notable for the following components:   Sodium 126 (*)    Glucose, Bld 143 (*)     Creatinine, Ser 1.34 (*)    GFR calc non Af Amer 39 (*)    GFR calc Af Amer 45 (*)    All other components within normal limits  COMPREHENSIVE METABOLIC PANEL - Abnormal; Notable for the following components:   Sodium 128 (*)    CO2 20 (*)    Glucose, Bld 141 (*)    Creatinine, Ser 1.22 (*)    GFR calc non Af Amer 44 (*)    GFR calc Af Amer 51 (*)    All other components within normal limits  BASIC METABOLIC PANEL - Abnormal; Notable for the following components:   Sodium 129 (*)    CO2 18 (*)    Glucose, Bld 221 (*)    Creatinine, Ser 1.33 (*)    GFR calc non Af Amer 40 (*)    GFR calc Af Amer 46 (*)    All other components within normal limits  BASIC METABOLIC PANEL - Abnormal; Notable for the following components:   Sodium 131 (*)    CO2 19 (*)    Glucose, Bld 229 (*)    Creatinine, Ser 1.41 (*)    Calcium 8.6 (*)    GFR calc non Af Amer 37 (*)    GFR calc Af Amer 43 (*)    All other components within normal limits  BASIC METABOLIC PANEL - Abnormal; Notable for the following components:   Sodium 133 (*)    CO2 21 (*)    Glucose, Bld 115 (*)    Creatinine, Ser 1.24 (*)    Calcium 8.7 (*)    GFR calc non Af Amer 43 (*)  GFR calc Af Amer 50 (*)    All other components within normal limits  GLUCOSE, CAPILLARY - Abnormal; Notable for the following components:   Glucose-Capillary 100 (*)    All other components within normal limits  BASIC METABOLIC PANEL - Abnormal; Notable for the following components:   CO2 18 (*)    Glucose, Bld 117 (*)    Calcium 8.7 (*)    All other components within normal limits  GLUCOSE, CAPILLARY - Abnormal; Notable for the following components:   Glucose-Capillary 239 (*)    All other components within normal limits  CORTISOL-AM, BLOOD - Abnormal; Notable for the following components:   Cortisol - AM 4.6 (*)    All other components within normal limits  GLUCOSE, CAPILLARY - Abnormal; Notable for the following components:    Glucose-Capillary 111 (*)    All other components within normal limits  GLUCOSE, CAPILLARY - Abnormal; Notable for the following components:   Glucose-Capillary 210 (*)    All other components within normal limits  GLUCOSE, CAPILLARY - Abnormal; Notable for the following components:   Glucose-Capillary 63 (*)    All other components within normal limits  GLUCOSE, CAPILLARY - Abnormal; Notable for the following components:   Glucose-Capillary 162 (*)    All other components within normal limits  GLUCOSE, CAPILLARY - Abnormal; Notable for the following components:   Glucose-Capillary 111 (*)    All other components within normal limits  GLUCOSE, CAPILLARY - Abnormal; Notable for the following components:   Glucose-Capillary 127 (*)    All other components within normal limits  GLUCOSE, CAPILLARY - Abnormal; Notable for the following components:   Glucose-Capillary 110 (*)    All other components within normal limits  GLUCOSE, CAPILLARY - Abnormal; Notable for the following components:   Glucose-Capillary 135 (*)    All other components within normal limits  SARS CORONAVIRUS 2 (HOSPITAL ORDER, PERFORMED IN [facility])  CBC WITH DIFFERENTIAL/PLATELET  TSH  NA AND K (SODIUM & POTASSIUM), RAND UR  CHLORIDE, URINE, RANDOM  ACTH STIMULATION, 3 TIME POINTS  GLUCOSE, CAPILLARY  BASIC METABOLIC PANEL    EKG None  Radiology No results found.  Procedures Procedures (including critical care time)  Medications Ordered in ED Medications  enoxaparin (LOVENOX) injection 40 mg (40 mg Subcutaneous Given 09/18/18 2029)  acetaminophen (TYLENOL) tablet 500-1,000 mg (1,000 mg Oral Given 09/19/18 0733)  docusate sodium (COLACE) capsule 100 mg (has no administration in time range)  loratadine (CLARITIN) tablet 10 mg (has no administration in time range)  pantoprazole (PROTONIX) EC tablet 40 mg (40 mg Oral Given 09/18/18 0932)  propranolol ER (INDERAL LA) 24 hr capsule 80 mg (80 mg Oral  Given 09/18/18 0943)  polyethylene glycol (MIRALAX / GLYCOLAX) packet 17 g (17 g Oral Given 09/18/18 0932)  ondansetron (ZOFRAN) injection 4 mg (4 mg Intravenous Given 09/19/18 0733)  insulin aspart (novoLOG) injection 0-7 Units (0 Units Subcutaneous Not Given 09/19/18 0727)  sodium chloride 0.9 % bolus 1,000 mL (1,000 mLs Intravenous New Bag/Given 09/16/18 1202)  ondansetron (ZOFRAN) injection 4 mg (4 mg Intravenous Given 09/16/18 1202)  acetaminophen (TYLENOL) tablet 1,000 mg (1,000 mg Oral Given 09/16/18 1158)  iohexol (OMNIPAQUE) 350 MG/ML injection 80 mL (80 mLs Intravenous Contrast Given 09/16/18 1304)  ondansetron (ZOFRAN) injection 4 mg (4 mg Intravenous Given 09/17/18 0518)  cosyntropin (CORTROSYN) injection 0.25 mg (0.25 mg Intravenous Given 09/19/18 0725)     Initial Impression / Assessment and Plan / ED Course  I  have reviewed the triage vital signs and the nursing notes.  Pertinent labs & imaging results that were available during my care of the patient were reviewed by me and considered in my medical decision making (see chart for details).        Hyponatremia.  Patient will be evaluated in the hospital for profound hyponatremia.  IV fluids given here in the emergency department.  Nontoxic.  Final Clinical Impressions(s) / ED Diagnoses   Final diagnoses:  Hyponatremia    ED Discharge Orders    None       Jola Schmidt, MD 09/19/18 [phone]

## 2018-09-19 NOTE — Progress Notes (Signed)
   Subjective: HD#2   Overnight: No acute overnight events   Today, Jasmine Farrell ate breakfast without difficulty. IN good mood. Still gets occasional tremors and nausea but significantly improved since admission. Also endorse occasional headaches and feeling 'hot.' but mentions she has good strength and able to ambulate to bathroom by herself. She states 'feeling sick' with nausea after getting medicine for the stim test. She requests information on the results. Discussed getting pheebotomy to come by for lab draw as soon as possible. Ms.Hineman expressed understanding.  Objective:  Vital signs in last 24 hours: Vitals:   09/18/18 0441 09/18/18 1411 09/18/18 2100 09/19/18 0618  BP: 138/76 (!) 163/94 (!) 150/71 113/76  Pulse: 68 70 64 70  Resp: 20 (!) 22 18 18   Temp: 98.1 F (36.7 C) 97.9 F (36.6 C) 98.1 F (36.7 C) 97.9 F (36.6 C)  TempSrc: Oral Oral Oral Oral  SpO2: 92% 100% 98% 100%  Weight: 59.5 kg     Height:       Const: In no apparent distress, lying comfortably in bed, conversational HEENT: Atraumatic, normocephalic Resp: CTA BL, no wheezes, crackles, rhonchi CV: RRR, no murmurs, gallop, rub  Assessment/Plan:  Principal Problem:   Hyponatremia Active Problems:   Celiac artery stenosis [facility])  Mrs. 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 She was treated with IV fluids with marked improvement in her serum creatinine and symptoms.   A/P: HypotonicHyponatremia, likely hypovolemic: Hyponatremia has resolved . DDx includes hypovolemic vs adrenal insufficiency.  Even though she had low serum cortisol at 4.6, her ACTH stim test were unremarkable.  We do not have a baseline ACTH level and she might warrant this outpatient. - Follow-up with PCP and obtain baseline ACTH level  AKI-resolved: Serum creatinine this a.m.  -Repeat  BMP in 4 weeks as an outpatient  Normal anion gap metabolic acidosis: Serum bicarb still low at 19<<18. DDx RTA vs adrenal insufficiency   - Requires work-up for renal acidosis.  DM type II: -Monitor CBGs consider SSI if indicated. Holding metformin will discharge  Code: Full Diet: Regular Fluids: As above DVT PPX: Enoxaparin Dispo: Anticipated discharge in approximately 0-1 day(s).    Jean Rosenthal, MD 09/19/2018, 6:29 AM Pager: [phone] Internal Medicine Teaching Service

## 2018-09-19 NOTE — Progress Notes (Signed)
Patient has discharged to home on 09/19/2018. Discharge instruction including medication and appointment was given to patient. Patient has no question at this time.

## 2018-09-21 DIAGNOSIS — R7989 Other specified abnormal findings of blood chemistry: Secondary | ICD-10-CM | POA: Insufficient documentation

## 2018-09-23 ENCOUNTER — Other Ambulatory Visit: Payer: Self-pay

## 2018-09-23 ENCOUNTER — Encounter: Payer: Self-pay | Admitting: Gastroenterology

## 2018-09-23 ENCOUNTER — Telehealth (INDEPENDENT_AMBULATORY_CARE_PROVIDER_SITE_OTHER): Payer: Medicare Other | Admitting: Gastroenterology

## 2018-09-23 VITALS — Ht 64.0 in | Wt 126.0 lb

## 2018-09-23 DIAGNOSIS — R1011 Right upper quadrant pain: Secondary | ICD-10-CM | POA: Diagnosis not present

## 2018-09-23 DIAGNOSIS — R1012 Left upper quadrant pain: Secondary | ICD-10-CM

## 2018-09-23 DIAGNOSIS — R1013 Epigastric pain: Secondary | ICD-10-CM

## 2018-09-23 DIAGNOSIS — K219 Gastro-esophageal reflux disease without esophagitis: Secondary | ICD-10-CM

## 2018-09-23 DIAGNOSIS — K581 Irritable bowel syndrome with constipation: Secondary | ICD-10-CM

## 2018-09-23 MED ORDER — PANTOPRAZOLE SODIUM 40 MG PO TBEC: 40.0000 mg | DELAYED_RELEASE_TABLET | Freq: Every day | ORAL | 11 refills | Status: DC

## 2018-09-23 NOTE — Progress Notes (Signed)
Have discussed with Dr. Kathlene Cote (IR)  No studies to prove that celiac stenting would work for median arcuate ligament.   Plan: -Will get opinion from vascular surgery colleagues.  Please arrange for consultation.   RG

## 2018-09-23 NOTE — Progress Notes (Signed)
Chief Complaint:   Referring Provider:  Leane CallNodal, Jr Reinaldo, PA-C      ASSESSMENT AND PLAN;   #1.  RUQ/LUQ pain. H/O Acute colitis on CT 05/05/2018-highly s/o ischemic colitis d/t clinical presentation, location (resolved).  Patent SMA/IMA on CTA 09/16/2018. Treated with Cipro and Flagyl.  Still with occ abdominal pain.  #2. GERD with HH. EGD 01/2018 superficial GU, Bx neg for HP/celiac.   #3. Postprandial Epigastric/LUQ pain with nausea. (s/p chole for biliary dyskinesia). 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  #4. IBS with constipation - neg cologuard 2017, neg colon 2001 (Dr Victorino DikeSam [place] advised not to get it done by Dr Victorino DikeSam [place] as she had very redundant colon, high risk for perforation).  #5. Recent admission to [facility] due to hypovolemic hyponatremia.   Plan: - Miralax 17g po bid. - Colace 1 tab po bid to continue. - Check CBC, CMP (pt seeing Dr Allena KatzPatel today) - We will ask Dr. Fredia SorrowYamagata from IR to determine if celiac stenting would help. - Protonix 40 mg p.o. QD #30, 11 refils - Cologuard test. Doesnot want colonoscopy at this time. - FU in 12 weeks.  If she continues to have problems, will consider colonoscopy followed by possible Sitz marker study.  If she still has upper GI symptoms, will recommend solid-phase gastric emptying scan.  I have texted Dr. Fredia SorrowYamagata. 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  Continued upper abdominal pain especially after eating, nausea on Zofran as needed.  Was found to have hypovolemic hyponatremia and was admitted to [facility] given IV fluids with resolution of hyponatremia.  Has been compliant with medications.  Has appointment Dr. Allena KatzPatel today.  Continues to have constipation which is better on MiraLAX and Colace.  Has not tried Linzess as it was very expensive.  Willing to do Cologuard.  Wants to hold  off on colonoscopy.  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  Patent SMA, IMA.   [facility] adm 6/5- 09/20/2018 with Na 121.  Hypovolemic hyponatremia, acute kidney injury due to dehydration, type 2 diabetes   ED visit - 05/05/2018 with acute abdominal pain with abdominal bloating and bloody diarrhea. CT scan showed colitis involving transverse colon, splenic flexure and proximal sigmoid colon. ?  Etiology.  Mesenteric blood vessels were patent.  stool negative for C. Difficile.  Treated with Cipro and Flagyl.   Husband recently had a car wreck-hence patient has not been exercising as she is taking care of him.   Past Medical History:  Diagnosis Date  . Anemia   . Anginal pain (HCC)   . Arthritis   . Bladder infection   . Colitis   . Complication of anesthesia    narrow airway per pt can have a bp drop and hard to wake up  . Diabetes mellitus without complication (HCC)   . Diffuse cystic mastopathy   . Diverticulosis   . Family history of adverse reaction to anesthesia    brother and sister hard to wake up  . GERD (gastroesophageal reflux disease)   . Headache   . Heart murmur   . History of kidney stones   . Hyperlipidemia   . Mitral valve disorder    MVP  . Mitral valve prolapse   . Paroxysmal atrial tachycardia (HCC)   .  PONV (postoperative nausea and vomiting)   . PSVT (paroxysmal supraventricular tachycardia) (HCC)   . PVC's (premature ventricular contractions)   . SVT (supraventricular tachycardia) (HCC)   . Varicose veins     Past Surgical History:  Procedure Laterality Date  . ABDOMINAL HYSTERECTOMY    . ANTERIOR AND POSTERIOR VAGINAL REPAIR    . ANTERIOR CERVICAL DECOMP/DISCECTOMY FUSION N/A 08/19/2016   Procedure: ANTERIOR CERVICAL DECOMPRESSION/DISCECTOMY FUSION CERVICAL FOUR- CERVICAL FIVE, CERVICAL FIVE- CERVICAL SIX;  Surgeon: Tressie StalkerJenkins, Jeffrey, MD;  [facility];  Service: Neurosurgery;  Laterality: N/A;  ANTERIOR  CERVICAL DECOMPRESSION/DISCECTOMY FUSION CERVICAL 4- CERVICAL 5, CERVICAL 5- CERVICAL6  . APPENDECTOMY    . BREAST LUMPECTOMY    . CHOLECYSTECTOMY    . ELBOW SURGERY Right   . HERNIA REPAIR      Family History  Problem Relation Age of Onset  . Esophageal cancer Father   . Diverticulitis Sister   . Colon cancer Neg Hx     Social History   Tobacco Use  . Smoking status: Never Smoker  . Smokeless tobacco: Never Used  Substance Use Topics  . Alcohol use: No  . Drug use: No    Current Outpatient Medications  Medication Sig Dispense Refill  . acetaminophen (TYLENOL) 500 MG tablet Take 500-1,000 mg by mouth every 6 (six) hours as needed for mild pain or headache.     . Cyanocobalamin (B-12) 2500 MCG TABS Take 2,500 mcg by mouth daily as needed (to boost the immune system). Sublingual    . docusate sodium (COLACE) 100 MG capsule Take 1 capsule (100 mg total) by mouth daily as needed for mild constipation. 60 capsule 0  . Famotidine (PEPCID AC PO) Take 1 tablet by mouth at bedtime.    . lidocaine (LIDODERM) 5 % Place 1 patch onto the skin daily as needed (for pain). Remove & Discard patch within 12 hours or as directed by MD     . linaclotide (LINZESS) 72 MCG capsule Take 1 capsule (72 mcg total) by mouth daily before breakfast. 30 capsule 11  . loratadine (CLARITIN) 10 MG tablet Take 10 mg by mouth daily as needed for allergies or rhinitis.     . metFORMIN (GLUCOPHAGE) 500 MG tablet Take 250-500 mg by mouth See admin instructions. Take 250 mg by mouth in the morning before breakfast and 500 mg with the largest meal of the day    . Misc Natural Products (OSTEO BI-FLEX ADV TRIPLE ST PO) Take 1 capsule by mouth daily.     . ondansetron (ZOFRAN) 8 MG tablet Take 1 tablet (8 mg total) by mouth every 8 (eight) hours as needed for nausea or vomiting. 20 tablet 0  . pantoprazole (PROTONIX) 40 MG tablet Take 1 tablet (40 mg total) by mouth daily. 90 tablet 3  . Polyethyl Glycol-Propyl Glycol  (SYSTANE ULTRA OP) Place 1 drop into both eyes 2 (two) times daily.    . polyethylene glycol (MIRALAX / GLYCOLAX) 17 g packet Take 17 g by mouth daily as needed. 14 each 0  . propranolol ER (INDERAL LA) 80 MG 24 hr capsule Take 80 mg by mouth daily.    . vitamin C (ASCORBIC ACID) 500 MG tablet Take 500 mg by mouth daily.     No current facility-administered medications for this visit.     Allergies  Allergen Reactions  . Dilaudid [Hydromorphone] Shortness Of Breath, Nausea And Vomiting and Other (See Comments)    "Depressed my respiratory drive and made me hypotensive"  .  Fentanyl Shortness Of Breath and Nausea And Vomiting    "Depressed my respiratory drive and made me hypotensive"  . Latex Anaphylaxis, Rash and Other (See Comments)    Caused redness also; BLISTERS THE SKIN (a bite block did this)  . Morphine And Related Shortness Of Breath, Nausea And Vomiting and Other (See Comments)    "Depressed my respiratory drive and made me hypotensive"  . Aspirin Nausea Only    Irritates the stomach  . Codeine Nausea And Vomiting  . Nsaids Nausea Only and Other (See Comments)    These irritate the stomach  . Other Other (See Comments)    Patient is sensitive to "binders" or "fillers" in meds  . Oxycodone Nausea Only and Other (See Comments)    Requires anti-nausea medication to tolerate  . Statins Other (See Comments)    "Made my joints hurt"  . Verapamil Other (See Comments)    "Threw me into V-Tach"  . Adhesive [Tape] Rash and Other (See Comments)    Leaves an "imprint" on the skin    Review of Systems:  neg     Physical Exam:    Ht 5\' 4"  (1.626 m)   Wt 126 lb (57.2 kg)   BMI 21.63 kg/m  Filed Weights   09/23/18 0835  Weight: 126 lb (57.2 kg)   televisit .  Data Reviewed: I have personally reviewed following labs and imaging studies  CBC: CBC Latest Ref Rng & Units 09/16/2018 05/10/2018 05/05/2018  WBC 4.0 - 10.5 K/uL 6.6 10.5 11.6(H)  Hemoglobin 12.0 - 15.0 g/dL 16.113.9  09.612.6 04.514.0  Hematocrit 36.0 - 46.0 % 40.8 37.8 42.8  Platelets 150 - 400 K/uL 258 258.0 231    CMP: CMP Latest Ref Rng & Units 09/19/2018 09/18/2018 09/17/2018  Glucose 70 - 99 mg/dL 409(W136(H) 119(J117(H) 478(G115(H)  BUN 8 - 23 mg/dL 11 12 15   Creatinine 0.44 - 1.00 mg/dL 9.561.00 2.130.93 0.86(V1.24(H)  Sodium 135 - 145 mmol/L 134(L) 136 133(L)  Potassium 3.5 - 5.1 mmol/L 4.1 4.4 4.6  Chloride 98 - 111 mmol/L 105 111 106  CO2 22 - 32 mmol/L 19(L) 18(L) 21(L)  Calcium 8.9 - 10.3 mg/dL 9.5 7.8(I8.7(L) 6.9(G8.7(L)  Total Protein 6.5 - 8.1 g/dL - - -  Total Bilirubin 0.3 - 1.2 mg/dL - - -  Alkaline Phos 38 - 126 U/L - - -  AST 15 - 41 U/L - - -  ALT 0 - 44 U/L - - -    GFR: Estimated Creatinine Clearance: 43.3 mL/min (by C-G formula based on SCr of 1 mg/dL). Liver Function Tests: Recent Labs  Lab 09/16/18 1108 09/17/18 0439  AST 27 21  ALT 45* 35  ALKPHOS 103 90  BILITOT 1.1 0.8  PROT 8.1 6.6  ALBUMIN 4.2 3.5     Recent Results (from the past 240 hour(s))  SARS Coronavirus 2 (CEPHEID - Performed in [facility] hospital lab), Hosp Order     Status: None   Collection Time: 09/16/18  3:27 PM   Specimen: Nasopharyngeal Swab  Result Value Ref Range Status   SARS Coronavirus 2 NEGATIVE NEGATIVE Final    Comment: (NOTE) If result is NEGATIVE SARS-CoV-2 target nucleic acids are NOT DETECTED. The SARS-CoV-2 RNA is generally detectable in upper and lower  respiratory specimens during the acute phase of infection. The lowest  concentration of SARS-CoV-2 viral copies this assay can detect is 250  copies / mL. A negative result does not preclude SARS-CoV-2 infection  and should not  be used as the sole basis for treatment or other  patient management decisions.  A negative result may occur with  improper specimen collection / handling, submission of specimen other  than nasopharyngeal swab, presence of viral mutation(s) within the  areas targeted by this assay, and inadequate number of viral copies  (<250 copies / mL).  A negative result must be combined with clinical  observations, patient history, and epidemiological information. If result is POSITIVE SARS-CoV-2 target nucleic acids are DETECTED. The SARS-CoV-2 RNA is generally detectable in upper and lower  respiratory specimens dur ing the acute phase of infection.  Positive  results are indicative of active infection with SARS-CoV-2.  Clinical  correlation with patient history and other diagnostic information is  necessary to determine patient infection status.  Positive results do  not rule out bacterial infection or co-infection with other viruses. If result is PRESUMPTIVE POSTIVE SARS-CoV-2 nucleic acids MAY BE PRESENT.   A presumptive positive result was obtained on the submitted specimen  and confirmed on repeat testing.  While 2019 novel coronavirus  (SARS-CoV-2) nucleic acids may be present in the submitted sample  additional confirmatory testing may be necessary for epidemiological  and / or clinical management purposes  to differentiate between  SARS-CoV-2 and other Sarbecovirus currently known to infect humans.  If clinically indicated additional testing with an alternate test  methodology [phone]) is advised. The SARS-CoV-2 RNA is generally  detectable in upper and lower respiratory sp ecimens during the acute  phase of infection. The expected result is Negative. Fact Sheet for Patients:  StrictlyIdeas.no Fact Sheet for Healthcare Providers: BankingDealers.co.za This test is not yet approved or cleared by the [place] FDA and has been authorized for detection and/or diagnosis of SARS-CoV-2 by FDA under an Emergency Use Authorization (EUA).  This EUA will remain in effect (meaning this test can be used) for the duration of the COVID-19 declaration under Section 564(b)(1) of the Act, 21 U.S.C. section 360bbb-3(b)(1), unless the authorization is terminated or revoked sooner.  Performed at [facility], [place] [address], [place], [place] [address]       Radiology Studies: Dg Chest 2 View  Result Date: 09/16/2018 CLINICAL DATA:  Low sodium.  Dizziness.  Syncope. EXAM: CHEST - 2 VIEW COMPARISON:  Chest x-ray 07/29/2018. FINDINGS: Mediastinum and hilar structures normal. Mild bibasilar subsegmental atelectasis and or scarring again noted. No focal alveolar infiltrate. No pleural effusion or pneumothorax. Heart size stable, no pulmonary venous congestion. Prior cervical spine fusion. No acute bony abnormality. Old healed fracture left humerus. Surgical clips right upper quadrant. IMPRESSION: Mild bibasilar subsegmental atelectasis and or scarring again noted. No acute cardiopulmonary disease identified. Electronically Signed   By: Marcello Moores  Register   On: 09/16/2018 11:53   Ct Angio Abd/pel W And/or Wo Contrast  Result Date: 09/16/2018 CLINICAL DATA:  Generalized abdominal pain, weight loss, nausea and vomiting. EXAM: CT ANGIOGRAPHY ABDOMEN AND PELVIS WITH CONTRAST TECHNIQUE: Multidetector CT imaging of the abdomen and pelvis was performed using the standard protocol during bolus administration of intravenous contrast. Multiplanar reconstructed images and MIPs were obtained and reviewed to evaluate the vascular anatomy. CONTRAST:  93mL OMNIPAQUE IOHEXOL 350 MG/ML SOLN COMPARISON:  CT of the abdomen and pelvis on 05/05/2018 FINDINGS: VASCULAR Aorta: The abdominal aorta demonstrates scattered plaque without evidence of aneurysm or dissection. 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  There also likely is some component of atherosclerosis. On the sagittal reconstructions maximal degree of focal narrowing approaches 75-80%. SMA: Normally patent.  Distal branch vessels appear widely patent. Renals: Bilateral single renal arteries demonstrate  normal patency. IMA: Normally patent. Inflow: Normally patent bilateral iliac arteries without aneurysm or significant obstructive disease. Proximal Outflow: Normally patent bilateral common femoral arteries and femoral bifurcations. Veins: Venous phase imaging was also performed demonstrating normally patent venous structures including the portal vein, mesenteric veins, splenic vein, renal veins, IVC, iliac veins and common femoral veins. Review of the MIP images confirms the above findings. NON-VASCULAR Lower chest: Mild bibasilar scarring. Hepatobiliary: No focal liver abnormality is seen. Status post cholecystectomy. No biliary dilatation. Pancreas: Unremarkable. No pancreatic ductal dilatation or surrounding inflammatory changes. Spleen: Normal in size without focal abnormality. Adrenals/Urinary Tract: Adrenal glands are unremarkable. Kidneys are normal, without renal calculi, focal lesion, or hydronephrosis. Bladder is unremarkable. Stomach/Bowel: Bowel shows no evidence of obstruction, ileus, inflammation or lesion. No free air identified. Lymphatic: No significant vascular findings are present. No enlarged abdominal or pelvic lymph nodes. Reproductive: Status post hysterectomy. No adnexal masses. Other: No abdominal wall hernia or abnormality. No abdominopelvic ascites. Musculoskeletal: No acute or significant osseous findings. 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 Configuration of stenosis is felt to be mostly on the basis of arcuate ligament compression, although there likely is also some atherosclerosis present. Maximal narrowing approaches 75-80%. The SMA and IMA are normally patent. NON-VASCULAR No significant nonvascular findings. Electronically Signed   By: Irish Lack M.D.   On: 09/16/2018 14:02   This service was provided via telemedicine.  The patient was located at home.  The provider was located in  office.  The patient did consent to this telephone visit and is aware of possible charges through their insurance for this visit.  The patient was referred by Dr Shary Decamp.    Time spent on call: 25 min    Edman Circle, MD 09/23/2018, 9:11 AM  Cc: Leane Call, PA-C

## 2018-09-23 NOTE — Patient Instructions (Signed)
If you are age 74 or older, your body mass index should be between 23-30. Your Body mass index is 21.63 kg/m. If this is out of the aforementioned range listed, please consider follow up with your Primary Care Provider.  If you are age 37 or younger, your body mass index should be between 19-25. Your Body mass index is 21.63 kg/m. If this is out of the aformentioned range listed, please consider follow up with your Primary Care Provider.   Please purchase the following medications over the counter and take as directed: Miralax twice daily Colace twice daily  We have sent the following medications to your pharmacy for you to pick up at your convenience: Protonix   Your provider has ordered Cologuard testing as an option for colon cancer screening. This is performed by [employer] and may be out of network with your insurance. PRIOR to completing the test, it is YOUR responsibility to contact your insurance about covered benefits for this test. Your out of pocket expense could be anywhere from $0.00 to $649.00.   When you call to check coverage with your insurer, please provide the following information:   -The ONLY provider of Cologuard is [place] code for Cologuard is [phone].  [employer] NPI # [id number]  -[employer] Tax ID # [id number]   We have already sent your demographic and insurance information to [employer] (phone number [phone]) and they should contact you within the next week regarding your test. If you have not heard from them within the next week, please call our office at [phone].   Your provider has requested that you go to the basement level for lab work at [facility]. Press "B" on the elevator. The lab is located at the first door on the left as you exit the elevator.  Thank you,  Dr. Jackquline Denmark

## 2018-09-30 ENCOUNTER — Telehealth: Payer: Self-pay | Admitting: Gastroenterology

## 2018-09-30 NOTE — Telephone Encounter (Signed)
Called patient and let her know I called the [place] office and spoke to [place] to see if the lab results from Dr. Serita Grit office had been faxed and she does not see anything. Patient is going to call Dr. Serita Grit office and check on them

## 2018-09-30 NOTE — Telephone Encounter (Signed)
Patient called in wanting to speak with the nurse about her lab results.

## 2018-10-03 ENCOUNTER — Other Ambulatory Visit: Payer: Self-pay | Admitting: Gastroenterology

## 2018-10-03 DIAGNOSIS — K219 Gastro-esophageal reflux disease without esophagitis: Secondary | ICD-10-CM

## 2018-10-03 DIAGNOSIS — R1013 Epigastric pain: Secondary | ICD-10-CM

## 2018-10-03 DIAGNOSIS — R1012 Left upper quadrant pain: Secondary | ICD-10-CM

## 2018-10-03 DIAGNOSIS — R1011 Right upper quadrant pain: Secondary | ICD-10-CM

## 2018-10-04 ENCOUNTER — Ambulatory Visit
Admission: RE | Admit: 2018-10-04 | Discharge: 2018-10-04 | Disposition: A | Discharge status: Home / Self Care | Payer: Medicare Other | Source: Ambulatory Visit | Attending: Gastroenterology | Admitting: Gastroenterology

## 2018-10-04 ENCOUNTER — Encounter: Payer: Self-pay | Admitting: *Deleted

## 2018-10-04 ENCOUNTER — Other Ambulatory Visit: Payer: Self-pay

## 2018-10-04 DIAGNOSIS — R1012 Left upper quadrant pain: Secondary | ICD-10-CM

## 2018-10-04 DIAGNOSIS — R1013 Epigastric pain: Secondary | ICD-10-CM

## 2018-10-04 DIAGNOSIS — R1011 Right upper quadrant pain: Secondary | ICD-10-CM

## 2018-10-04 DIAGNOSIS — K219 Gastro-esophageal reflux disease without esophagitis: Secondary | ICD-10-CM

## 2018-10-04 HISTORY — PX: IR RADIOLOGIST EVAL & MGMT: IMG5224

## 2018-10-04 NOTE — Consult Note (Signed)
Chief Complaint: Patient was consulted remotely today (TeleHealth) for celiac artery stenosis and abdominal pain at the request of Gupta,Rajesh.    Referring Physician(s): Gupta,Rajesh  History of Present Illness: Jasmine Farrell is a 73 y.o. female with symptoms since January of constant nausea, weakness, lack of normal bowel movements, diaphoresis, RLQ/LUQ sporadic abdominal pain.  Denies intestinal angina or consistent pain with meals. Also lower extremity cramps bilaterally at night, left greater than right. No blood in stool or melena. No significant vomiting. Sometimes pain with deep inspiration in LUQ.   Had colitis in January with CT showing inflammation and thickening of splenic flexure and descending colon. C diff was negative. 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 The SMA and IMA are normally patent. Colonic inflammation appeared resolved on the recent CTA.   Status post prior rectocele repair. EGD last year by Dr. Lyndel Safe showed superficial gastric ulcer. Had unremarkable colonoscopy in 2001 by Dr. Velora Heckler. The patient is a retired Development worker, community.   Past Medical History:  Diagnosis Date  . Anemia   . Anginal pain ([place])   . Arthritis   . Bladder infection   . Colitis   . Complication of anesthesia    narrow airway per pt can have a bp drop and hard to wake up  . Diabetes mellitus without complication ([place])   . Diffuse cystic mastopathy   . Diverticulosis   . Family history of adverse reaction to anesthesia    brother and sister hard to wake up  . GERD (gastroesophageal reflux disease)   . Headache   . Heart murmur   . History of kidney stones   . Hyperlipidemia   . Mitral valve disorder    MVP  . Mitral valve prolapse   . Paroxysmal atrial tachycardia ([place])   . PONV (postoperative nausea and vomiting)   . PSVT  (paroxysmal supraventricular tachycardia) ([place])   . PVC's (premature ventricular contractions)   . SVT (supraventricular tachycardia) ([place])   . Varicose veins     Past Surgical History:  Procedure Laterality Date  . ABDOMINAL HYSTERECTOMY    . ANTERIOR AND POSTERIOR VAGINAL REPAIR    . ANTERIOR CERVICAL DECOMP/DISCECTOMY FUSION N/A 08/19/2016   Procedure: ANTERIOR CERVICAL DECOMPRESSION/DISCECTOMY FUSION CERVICAL FOUR- CERVICAL FIVE, CERVICAL FIVE- CERVICAL SIX;  Surgeon: Newman Pies, MD;  Location: [facility];  Service: Neurosurgery;  Laterality: N/A;  ANTERIOR CERVICAL DECOMPRESSION/DISCECTOMY FUSION CERVICAL 4- CERVICAL 5, CERVICAL 5- CERVICAL6  . APPENDECTOMY    . BREAST LUMPECTOMY    . CHOLECYSTECTOMY    . ELBOW SURGERY Right   . HERNIA REPAIR      Allergies: Dilaudid [hydromorphone], Fentanyl, Latex, Morphine and related, Aspirin, Codeine, Nsaids, Other, Oxycodone, Statins, Verapamil, and Adhesive [tape]  Medications: Prior to Admission medications   Medication Sig Start Date End Date Taking? Authorizing Provider  acetaminophen (TYLENOL) 500 MG tablet Take 500-1,000 mg by mouth every 6 (six) hours as needed for mild pain or headache.     [provider]  Cyanocobalamin (B-12) 2500 MCG TABS Take 2,500 mcg by mouth daily as needed (to boost the immune system). Sublingual    [provider]  docusate sodium (COLACE) 100 MG capsule Take 1 capsule (100 mg total) by mouth daily as needed for mild constipation. 09/19/18   Jean Rosenthal, MD  Famotidine (PEPCID AC PO) Take 1 tablet by mouth  at bedtime.    [provider]  lidocaine (LIDODERM) 5 % Place 1 patch onto the skin daily as needed (for pain). Remove & Discard patch within 12 hours or as directed by MD     [provider]  linaclotide (LINZESS) 72 MCG capsule Take 1 capsule (72 mcg total) by mouth daily before breakfast. 09/14/18   Lynann BolognaGupta, Rajesh, MD  loratadine (CLARITIN) 10 MG tablet Take 10 mg by  mouth daily as needed for allergies or rhinitis.     [provider]  metFORMIN (GLUCOPHAGE) 500 MG tablet Take 250-500 mg by mouth See admin instructions. Take 250 mg by mouth in the morning before breakfast and 500 mg with the largest meal of the day    [provider]  Misc Natural Products (OSTEO BI-FLEX ADV TRIPLE ST PO) Take 1 capsule by mouth daily.     [provider]  ondansetron (ZOFRAN) 8 MG tablet Take 1 tablet (8 mg total) by mouth every 8 (eight) hours as needed for nausea or vomiting. 09/14/18   Lynann BolognaGupta, Rajesh, MD  pantoprazole (PROTONIX) 40 MG tablet Take 1 tablet (40 mg total) by mouth daily. 09/23/18   Lynann BolognaGupta, Rajesh, MD  Polyethyl Glycol-Propyl Glycol (SYSTANE ULTRA OP) Place 1 drop into both eyes 2 (two) times daily.    [provider]  polyethylene glycol (MIRALAX / GLYCOLAX) 17 g packet Take 17 g by mouth daily as needed. 09/19/18   Yvette RackAgyei, Obed K, MD  propranolol ER (INDERAL LA) 80 MG 24 hr capsule Take 80 mg by mouth daily.    [provider]  vitamin C (ASCORBIC ACID) 500 MG tablet Take 500 mg by mouth daily.    [provider]     Family History  Problem Relation Age of Onset  . Esophageal cancer Father   . Diverticulitis Sister   . Colon cancer Neg Hx     Social History   Socioeconomic History  . Marital status: Married    Spouse name: Not on file  . Number of children: 2  . Years of education: Not on file  . Highest education level: Not on file  Occupational History  . Not on file  Social Needs  . Financial resource strain: Not on file  . Food insecurity    Worry: Not on file    Inability: Not on file  . Transportation needs    Medical: Not on file    Non-medical: Not on file  Tobacco Use  . Smoking status: Never Smoker  . Smokeless tobacco: Never Used  Substance and Sexual Activity  . Alcohol use: No  . Drug use: No  . Sexual activity: Not on file  Lifestyle  . Physical activity    Days per week:  Not on file    Minutes per session: Not on file  . Stress: Not on file  Relationships  . Social Musicianconnections    Talks on phone: Not on file    Gets together: Not on file    Attends religious service: Not on file    Active member of club or organization: Not on file    Attends meetings of clubs or organizations: Not on file    Relationship status: Not on file  Other Topics Concern  . Not on file  Social History Narrative  . Not on file    Review of Systems  Constitutional: Positive for diaphoresis and fatigue.  Respiratory: Negative.   Cardiovascular: Negative.   Gastrointestinal: Positive for abdominal  pain, constipation and nausea.  Genitourinary: Negative.   Musculoskeletal: Negative.   Neurological: Negative.   Hematological: Negative.     Review of Systems: A 12 point ROS discussed and pertinent positives are indicated in the HPI above.  All other systems are negative.  Physical Exam No direct physical exam was performed.  Vital Signs: There were no vitals taken for this visit.  Imaging: Dg Chest 2 View  Result Date: 09/16/2018 CLINICAL DATA:  Low sodium.  Dizziness.  Syncope. EXAM: CHEST - 2 VIEW COMPARISON:  Chest x-ray 07/29/2018. FINDINGS: Mediastinum and hilar structures normal. Mild bibasilar subsegmental atelectasis and or scarring again noted. No focal alveolar infiltrate. No pleural effusion or pneumothorax. Heart size stable, no pulmonary venous congestion. Prior cervical spine fusion. No acute bony abnormality. Old healed fracture left humerus. Surgical clips right upper quadrant. IMPRESSION: Mild bibasilar subsegmental atelectasis and or scarring again noted. No acute cardiopulmonary disease identified. Electronically Signed   By: Maisie Fushomas  Register   On: 09/16/2018 11:53   Ct Angio Abd/pel W And/or Wo Contrast  Result Date: 09/16/2018 CLINICAL DATA:  Generalized abdominal pain, weight loss, nausea and vomiting. EXAM: CT ANGIOGRAPHY ABDOMEN AND PELVIS WITH  CONTRAST TECHNIQUE: Multidetector CT imaging of the abdomen and pelvis was performed using the standard protocol during bolus administration of intravenous contrast. Multiplanar reconstructed images and MIPs were obtained and reviewed to evaluate the vascular anatomy. CONTRAST:  80mL OMNIPAQUE IOHEXOL 350 MG/ML SOLN COMPARISON:  CT of the abdomen and pelvis on 05/05/2018 FINDINGS: VASCULAR Aorta: The abdominal aorta demonstrates scattered plaque without evidence of aneurysm or dissection. 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 There also likely is some component of atherosclerosis. On the sagittal reconstructions maximal degree of focal narrowing approaches 75-80%. SMA: Normally patent.  Distal branch vessels appear widely patent. Renals: Bilateral single renal arteries demonstrate normal patency. IMA: Normally patent. Inflow: Normally patent bilateral iliac arteries without aneurysm or significant obstructive disease. Proximal Outflow: Normally patent bilateral common femoral arteries and femoral bifurcations. Veins: Venous phase imaging was also performed demonstrating normally patent venous structures including the portal vein, mesenteric veins, splenic vein, renal veins, IVC, iliac veins and common femoral veins. Review of the MIP images confirms the above findings. NON-VASCULAR Lower chest: Mild bibasilar scarring. Hepatobiliary: No focal liver abnormality is seen. Status post cholecystectomy. No biliary dilatation. Pancreas: Unremarkable. No pancreatic ductal dilatation or surrounding inflammatory changes. Spleen: Normal in size without focal abnormality. Adrenals/Urinary Tract: Adrenal glands are unremarkable. Kidneys are normal, without renal calculi, focal lesion, or hydronephrosis. Bladder is unremarkable. Stomach/Bowel: Bowel shows no evidence of obstruction,  ileus, inflammation or lesion. No free air identified. Lymphatic: No significant vascular findings are present. No enlarged abdominal or pelvic lymph nodes. Reproductive: Status post hysterectomy. No adnexal masses. Other: No abdominal wall hernia or abnormality. No abdominopelvic ascites. Musculoskeletal: No acute or significant osseous findings. 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 Configuration of stenosis is felt to be mostly on the basis of arcuate ligament compression, although there likely is also some atherosclerosis present. Maximal narrowing approaches 75-80%. The SMA and IMA are normally patent. NON-VASCULAR No significant nonvascular findings. Electronically Signed   By: Irish LackGlenn  Cilicia Borden M.D.   On: 09/16/2018 14:02    Labs:  CBC: Recent Labs    05/05/18 1401 05/10/18 1443 09/16/18 1108  WBC 11.6* 10.5 6.6  HGB 14.0 12.6 13.9  HCT 42.8 37.8 40.8  PLT 231 258.0 258    COAGS: No results for input(s): INR, APTT in the last 8760 hours.  BMP: Recent Labs    09/17/18 1536 09/17/18 2130 09/18/18 0325 09/19/18 0724  NA 131* 133* 136 134*  K 4.5 4.6 4.4 4.1  CL 103 106 111 105  CO2 19* 21* 18* 19*  GLUCOSE 229* 115* 117* 136*  BUN 14 15 12 11   CALCIUM 8.6* 8.7* 8.7* 9.5  CREATININE 1.41* 1.24* 0.93 1.00  GFRNONAA 37* 43* >60 56*  GFRAA 43* 50* >60 >60    LIVER FUNCTION TESTS: Recent Labs    05/05/18 1401 05/10/18 1443 09/16/18 1108 09/17/18 0439  BILITOT 1.1 0.5 1.1 0.8  AST 21 13 27 21   ALT 20 10 45* 35  ALKPHOS 73 69 103 90  PROT 7.8 7.3 8.1 6.6  ALBUMIN 4.2 3.9 4.2 3.5     Assessment and Plan:  I spoke with Jasmine Farrell by phone. While she does have chronic nausea, she does not have the classic post-prandial upper abdominal pain of median arcuate ligament syndrome (MALS). Some of her other symptoms are non-specific and she is also being worked up by an  endocrinologist for diabetes and other endocrine workup.  With respect to MALS, I told Jasmine Farrell that angioplasty and/or stenting of the celiac axis has not had any proven long-term benefit in the literature. The only proven procedure is surgical release of arcuate ligament compression on the celiac axis and celiac plexus. Surgery can be controversial and is typically only performed in the rare clinically convincing cases of MALS.   I told Jasmine Farrell that she could certainly seek the opinion of a vascular surgeon with regard to surgery for MALS. She may require further GI workup with Dr. Chales AbrahamsGupta as well. There are no proven percutaneous treatement options that I can offer her.  Thank you for this interesting consult.  I greatly enjoyed meeting Jasmine Farrell and look forward to participating in their care.  A copy of this report was sent to the requesting provider on this date.  Electronically Signed: Reola CalkinsGlenn T Charene Mccallister 10/04/2018, 9:07 AM     I spent a total of 30 Minutes  in remote  clinical consultation, greater than 50% of which was counseling/coordinating care for celiac axis stenosis.    Visit type: Audio only (telephone). Audio (no video) only due to patient's lack of internet/smartphone capability. Alternative for in-person consultation at [facility], [address], [place], [place]. This visit type was conducted due to national recommendations for restrictions regarding the COVID-19 Pandemic (e.g. social distancing).  This format is felt to be most appropriate for this patient at this time.  All issues noted in this document were discussed and addressed.

## 2018-10-26 ENCOUNTER — Other Ambulatory Visit: Payer: Self-pay

## 2018-10-26 ENCOUNTER — Emergency Department (HOSPITAL_COMMUNITY): Payer: Medicare Other

## 2018-10-26 ENCOUNTER — Inpatient Hospital Stay (HOSPITAL_COMMUNITY)
Admission: EM | Admit: 2018-10-26 | Discharge: 2018-10-30 | DRG: 644 | Disposition: A | Discharge status: Home / Self Care | Payer: Medicare Other | Attending: Family Medicine | Admitting: Family Medicine

## 2018-10-26 ENCOUNTER — Encounter (HOSPITAL_COMMUNITY): Payer: Self-pay

## 2018-10-26 DIAGNOSIS — R072 Precordial pain: Secondary | ICD-10-CM

## 2018-10-26 DIAGNOSIS — K259 Gastric ulcer, unspecified as acute or chronic, without hemorrhage or perforation: Secondary | ICD-10-CM | POA: Diagnosis present

## 2018-10-26 DIAGNOSIS — Z20828 Contact with and (suspected) exposure to other viral communicable diseases: Secondary | ICD-10-CM | POA: Diagnosis present

## 2018-10-26 DIAGNOSIS — Z91040 Latex allergy status: Secondary | ICD-10-CM

## 2018-10-26 DIAGNOSIS — K581 Irritable bowel syndrome with constipation: Secondary | ICD-10-CM | POA: Diagnosis present

## 2018-10-26 DIAGNOSIS — Z9049 Acquired absence of other specified parts of digestive tract: Secondary | ICD-10-CM

## 2018-10-26 DIAGNOSIS — E119 Type 2 diabetes mellitus without complications: Secondary | ICD-10-CM

## 2018-10-26 DIAGNOSIS — Z91048 Other nonmedicinal substance allergy status: Secondary | ICD-10-CM

## 2018-10-26 DIAGNOSIS — Z885 Allergy status to narcotic agent status: Secondary | ICD-10-CM

## 2018-10-26 DIAGNOSIS — E872 Acidosis: Secondary | ICD-10-CM | POA: Diagnosis present

## 2018-10-26 DIAGNOSIS — R531 Weakness: Secondary | ICD-10-CM

## 2018-10-26 DIAGNOSIS — E222 Syndrome of inappropriate secretion of antidiuretic hormone: Secondary | ICD-10-CM | POA: Diagnosis not present

## 2018-10-26 DIAGNOSIS — E861 Hypovolemia: Secondary | ICD-10-CM | POA: Diagnosis present

## 2018-10-26 DIAGNOSIS — N179 Acute kidney failure, unspecified: Secondary | ICD-10-CM | POA: Diagnosis present

## 2018-10-26 DIAGNOSIS — I774 Celiac artery compression syndrome: Secondary | ICD-10-CM | POA: Diagnosis present

## 2018-10-26 DIAGNOSIS — Z888 Allergy status to other drugs, medicaments and biological substances status: Secondary | ICD-10-CM

## 2018-10-26 DIAGNOSIS — R194 Change in bowel habit: Secondary | ICD-10-CM

## 2018-10-26 DIAGNOSIS — Z79899 Other long term (current) drug therapy: Secondary | ICD-10-CM

## 2018-10-26 DIAGNOSIS — Z8719 Personal history of other diseases of the digestive system: Secondary | ICD-10-CM

## 2018-10-26 DIAGNOSIS — R634 Abnormal weight loss: Secondary | ICD-10-CM

## 2018-10-26 DIAGNOSIS — K760 Fatty (change of) liver, not elsewhere classified: Secondary | ICD-10-CM | POA: Diagnosis present

## 2018-10-26 DIAGNOSIS — D649 Anemia, unspecified: Secondary | ICD-10-CM | POA: Diagnosis present

## 2018-10-26 DIAGNOSIS — Z886 Allergy status to analgesic agent status: Secondary | ICD-10-CM

## 2018-10-26 DIAGNOSIS — R110 Nausea: Secondary | ICD-10-CM

## 2018-10-26 DIAGNOSIS — K219 Gastro-esophageal reflux disease without esophagitis: Secondary | ICD-10-CM | POA: Diagnosis present

## 2018-10-26 DIAGNOSIS — K5900 Constipation, unspecified: Secondary | ICD-10-CM

## 2018-10-26 DIAGNOSIS — N289 Disorder of kidney and ureter, unspecified: Secondary | ICD-10-CM

## 2018-10-26 DIAGNOSIS — I341 Nonrheumatic mitral (valve) prolapse: Secondary | ICD-10-CM | POA: Diagnosis present

## 2018-10-26 DIAGNOSIS — E871 Hypo-osmolality and hyponatremia: Secondary | ICD-10-CM

## 2018-10-26 DIAGNOSIS — R1084 Generalized abdominal pain: Secondary | ICD-10-CM

## 2018-10-26 DIAGNOSIS — Z7984 Long term (current) use of oral hypoglycemic drugs: Secondary | ICD-10-CM

## 2018-10-26 DIAGNOSIS — I10 Essential (primary) hypertension: Secondary | ICD-10-CM | POA: Diagnosis present

## 2018-10-26 DIAGNOSIS — I771 Stricture of artery: Secondary | ICD-10-CM | POA: Diagnosis present

## 2018-10-26 LAB — CBC
HCT: 35.1 % — ABNORMAL LOW (ref 36.0–46.0)
Hemoglobin: 11.9 g/dL — ABNORMAL LOW (ref 12.0–15.0)
MCH: 31.1 pg (ref 26.0–34.0)
MCHC: 33.9 g/dL (ref 30.0–36.0)
MCV: 91.6 fL (ref 80.0–100.0)
Platelets: 333 10*3/uL (ref 150–400)
RBC: 3.83 MIL/uL — ABNORMAL LOW (ref 3.87–5.11)
RDW: 13.1 % (ref 11.5–15.5)
WBC: 7.2 10*3/uL (ref 4.0–10.5)
nRBC: 0 % (ref 0.0–0.2)

## 2018-10-26 LAB — TROPONIN I (HIGH SENSITIVITY): Troponin I (High Sensitivity): 2 ng/L (ref <18)

## 2018-10-26 LAB — BASIC METABOLIC PANEL
Anion gap: 12 (ref 5–15)
BUN: 17 mg/dL (ref 8–23)
CO2: 20 mmol/L — ABNORMAL LOW (ref 22–32)
Calcium: 10.1 mg/dL (ref 8.9–10.3)
Chloride: 97 mmol/L — ABNORMAL LOW (ref 98–111)
Creatinine, Ser: 1.37 mg/dL — ABNORMAL HIGH (ref 0.44–1.00)
GFR calc Af Amer: 44 mL/min — ABNORMAL LOW (ref >60)
GFR calc non Af Amer: 38 mL/min — ABNORMAL LOW (ref >60)
Glucose, Bld: 119 mg/dL — ABNORMAL HIGH (ref 70–99)
Potassium: 4.8 mmol/L (ref 3.5–5.1)
Sodium: 129 mmol/L — ABNORMAL LOW (ref 135–145)

## 2018-10-26 MED ORDER — ONDANSETRON HCL 4 MG/2ML IJ SOLN
4.0000 mg | Freq: Once | INTRAMUSCULAR | Status: AC
  Administered 2018-10-27: 4 mg via INTRAVENOUS
  Filled 2018-10-26: qty 2

## 2018-10-26 MED ORDER — SODIUM CHLORIDE 0.9% FLUSH: 3.0000 mL | Freq: Once | INTRAVENOUS | Status: DC

## 2018-10-26 MED ORDER — SODIUM CHLORIDE 0.9 % IV BOLUS (SEPSIS)
1000.0000 mL | Freq: Once | INTRAVENOUS | Status: AC
  Administered 2018-10-27: 1000 mL via INTRAVENOUS

## 2018-10-26 NOTE — ED Triage Notes (Signed)
Pt states that she went to PCP for follow up and told that her NA was 125 and told to come here, recently admitted for the same last month. Pt states that she is been having some CP, SOB and nausea as well

## 2018-10-26 NOTE — ED Provider Notes (Signed)
[facility] EMERGENCY DEPARTMENT Provider Note   CSN: [id number] Arrival date & time: 10/26/18  1956     History   Chief Complaint Chief Complaint  Patient presents with  . Abnormal Lab    HPI Jasmine Farrell is a 73 y.o. female.     The history is provided by the patient.  Abnormal Lab Result type: chemistry   Chemistry:    Sodium:  Low Weakness Severity:  Moderate Onset quality:  Gradual Timing:  Constant Progression:  Worsening Chronicity:  Recurrent Relieved by:  Nothing Worsened by:  Activity Associated symptoms: abdominal pain, arthralgias, cough, myalgias and vomiting   Associated symptoms: no fever   Patient presents with weakness and abnormal labs  Patient reports ongoing weakness for the past several weeks.  She reports occasional nausea and vomiting.  She also has intermittent chest and abdominal pain.  She reports being admitted last month for hyponatremia, unclear etiology.  She has had follow-up since then reports no formal diagnosis. She reports weakness is worsening, seen by PCP yesterday and had abnormal labs and advised to go to ER  Past Medical History:  Diagnosis Date  . Anemia   . Anginal pain ([place])   . Arthritis   . Bladder infection   . Colitis   . Complication of anesthesia    narrow airway per pt can have a bp drop and hard to wake up  . Diabetes mellitus without complication ([place])   . Diffuse cystic mastopathy   . Diverticulosis   . Family history of adverse reaction to anesthesia    brother and sister hard to wake up  . GERD (gastroesophageal reflux disease)   . Headache   . Heart murmur   . History of kidney stones   . Hyperlipidemia   . Mitral valve disorder    MVP  . Mitral valve prolapse   . Paroxysmal atrial tachycardia ([place])   . PONV (postoperative nausea and vomiting)   . PSVT (paroxysmal supraventricular tachycardia) ([place])   . PVC's (premature ventricular contractions)   . SVT (supraventricular  tachycardia) ([place])   . Varicose veins     Patient Active Problem List   Diagnosis Date Noted  . Celiac artery stenosis ([place]) 09/17/2018  . Hyponatremia 09/16/2018  . Cervical spondylosis with myelopathy and radiculopathy 08/19/2016  . Spider veins 02/08/2013  . Varicose veins of lower extremities with other complications 23/53/6144    Past Surgical History:  Procedure Laterality Date  . ABDOMINAL HYSTERECTOMY    . ANTERIOR AND POSTERIOR VAGINAL REPAIR    . ANTERIOR CERVICAL DECOMP/DISCECTOMY FUSION N/A 08/19/2016   Procedure: ANTERIOR CERVICAL DECOMPRESSION/DISCECTOMY FUSION CERVICAL FOUR- CERVICAL FIVE, CERVICAL FIVE- CERVICAL SIX;  Surgeon: Newman Pies, MD;  Location: [facility];  Service: Neurosurgery;  Laterality: N/A;  ANTERIOR CERVICAL DECOMPRESSION/DISCECTOMY FUSION CERVICAL 4- CERVICAL 5, CERVICAL 5- CERVICAL6  . APPENDECTOMY    . BREAST LUMPECTOMY    . CHOLECYSTECTOMY    . ELBOW SURGERY Right   . HERNIA REPAIR    . IR RADIOLOGIST EVAL & MGMT  10/04/2018     OB History   No obstetric history on file.      Home Medications    Prior to Admission medications   Medication Sig Start Date End Date Taking? Authorizing Provider  acetaminophen (TYLENOL) 500 MG tablet Take 500-1,000 mg by mouth every 6 (six) hours as needed for mild pain or headache.     [provider]  Cyanocobalamin (B-12) 2500 MCG TABS Take  2,500 mcg by mouth daily as needed (to boost the immune system). Sublingual    [provider]  docusate sodium (COLACE) 100 MG capsule Take 1 capsule (100 mg total) by mouth daily as needed for mild constipation. 09/19/18   Yvette RackAgyei, Obed K, MD  Famotidine (PEPCID AC PO) Take 1 tablet by mouth at bedtime.    [provider]  lidocaine (LIDODERM) 5 % Place 1 patch onto the skin daily as needed (for pain). Remove & Discard patch within 12 hours or as directed by MD     [provider]  linaclotide (LINZESS) 72 MCG capsule Take 1 capsule (72  mcg total) by mouth daily before breakfast. 09/14/18   Lynann BolognaGupta, Rajesh, MD  loratadine (CLARITIN) 10 MG tablet Take 10 mg by mouth daily as needed for allergies or rhinitis.     [provider]  metFORMIN (GLUCOPHAGE) 500 MG tablet Take 250-500 mg by mouth See admin instructions. Take 250 mg by mouth in the morning before breakfast and 500 mg with the largest meal of the day    [provider]  Misc Natural Products (OSTEO BI-FLEX ADV TRIPLE ST PO) Take 1 capsule by mouth daily.     [provider]  ondansetron (ZOFRAN) 8 MG tablet Take 1 tablet (8 mg total) by mouth every 8 (eight) hours as needed for nausea or vomiting. 09/14/18   Lynann BolognaGupta, Rajesh, MD  pantoprazole (PROTONIX) 40 MG tablet Take 1 tablet (40 mg total) by mouth daily. 09/23/18   Lynann BolognaGupta, Rajesh, MD  Polyethyl Glycol-Propyl Glycol (SYSTANE ULTRA OP) Place 1 drop into both eyes 2 (two) times daily.    [provider]  polyethylene glycol (MIRALAX / GLYCOLAX) 17 g packet Take 17 g by mouth daily as needed. 09/19/18   Yvette RackAgyei, Obed K, MD  propranolol ER (INDERAL LA) 80 MG 24 hr capsule Take 80 mg by mouth daily.    [provider]  vitamin C (ASCORBIC ACID) 500 MG tablet Take 500 mg by mouth daily.    [provider]    Family History Family History  Problem Relation Age of Onset  . Esophageal cancer Father   . Diverticulitis Sister   . Colon cancer Neg Hx     Social History Social History   Tobacco Use  . Smoking status: Never Smoker  . Smokeless tobacco: Never Used  Substance Use Topics  . Alcohol use: No  . Drug use: No     Allergies   Dilaudid [hydromorphone], Fentanyl, Latex, Morphine and related, Aspirin, Codeine, Nsaids, Other, Oxycodone, Statins, Verapamil, and Adhesive [tape]   Review of Systems Review of Systems  Constitutional: Positive for diaphoresis and fatigue. Negative for fever.  Respiratory: Positive for cough.   Gastrointestinal: Positive for abdominal pain  and vomiting.  Musculoskeletal: Positive for arthralgias and myalgias.  Neurological: Positive for weakness.  All other systems reviewed and are negative.    Physical Exam Updated Vital Signs BP 123/78 (BP Location: Right Arm)   Pulse 77   Temp (!) 97.3 F (36.3 C) (Oral)   Resp 17   SpO2 100%   Physical Exam CONSTITUTIONAL: Elderly, no acute distress HEAD: Normocephalic/atraumatic EYES: EOMI/PERRL ENMT: Mucous membranes moist NECK: supple no meningeal signs SPINE/BACK:entire spine nontender CV: S1/S2 noted, no murmurs/rubs/gallops noted LUNGS: Lungs are clear to auscultation bilaterally, no apparent distress ABDOMEN: soft, nontender, no rebound or guarding, bowel sounds noted throughout abdomen GU:no cva tenderness NEURO: Pt is awake/alert/appropriate, moves all extremitiesx4.  No facial droop.  No arm or leg drift EXTREMITIES: pulses normal/equal, full ROM SKIN: warm, color normal PSYCH: no abnormalities of mood noted, alert and oriented to situation   ED Treatments / Results  Labs (all labs ordered are listed, but only abnormal results are displayed) Labs Reviewed  BASIC METABOLIC PANEL - Abnormal; Notable for the following components:      Result Value   Sodium 129 (*)    Chloride 97 (*)    CO2 20 (*)    Glucose, Bld 119 (*)    Creatinine, Ser 1.37 (*)    GFR calc non Af Amer 38 (*)    GFR calc Af Amer 44 (*)    All other components within normal limits  CBC - Abnormal; Notable for the following components:   RBC 3.83 (*)    Hemoglobin 11.9 (*)    HCT 35.1 (*)    All other components within normal limits  CK - Abnormal; Notable for the following components:   Total CK 28 (*)    All other components within normal limits  SARS CORONAVIRUS 2 (HOSPITAL ORDER, PERFORMED IN [facility])  TROPONIN I (HIGH SENSITIVITY)  TROPONIN I (HIGH SENSITIVITY)    EKG EKG Interpretation  Date/Time:  Wednesday October 26 2018 20:19:57 EDT Ventricular Rate:  72  PR Interval:  144 QRS Duration: 74 QT Interval:  366 QTC Calculation: 400 R Axis:   44 Text Interpretation:  Normal sinus rhythm Low voltage QRS Borderline ECG No significant change since last tracing Confirmed by Zadie RhineWickline, Krislynn Gronau ([id number]) on 10/26/2018 11:52:20 PM   Radiology Dg Chest 2 View  Result Date: 10/26/2018 CLINICAL DATA:  73 y/o  F; chest pain. EXAM: CHEST - 2 VIEW COMPARISON:  09/16/2018 chest radiograph FINDINGS: Normal cardiac silhouette. Aortic atherosclerosis with calcification. Stable small linear opacity at left lung base compatible with scarring or atelectasis. No consolidation. No pleural effusion or pneumothorax. No acute osseous abnormality is evident. Right upper quadrant surgical clips, presumably cholecystectomy. IMPRESSION: No acute pulmonary process identified. Electronically Signed   By: Mitzi HansenLance  Furusawa-Stratton M.D.   On: 10/26/2018 20:56    Procedures Procedures   Medications Ordered in ED Medications  sodium chloride flush (NS) 0.9 % injection 3 mL (3 mLs Intravenous Not Given 10/26/18 2243)  sodium chloride 0.9 % bolus 1,000 mL (0 mLs Intravenous Stopped 10/27/18 0120)  ondansetron (ZOFRAN) injection 4 mg (4 mg Intravenous Given 10/27/18 0000)  acetaminophen (TYLENOL) tablet 650 mg (650 mg Oral Given 10/27/18 0058)     Initial Impression / Assessment and Plan / ED Course  I have reviewed the triage vital signs and the nursing notes.  Pertinent labs & imaging results that were available during my care of the patient were reviewed by me and considered in my medical decision making (see chart for details).        12:00 AM Patient presents with recurrent hyponatremia. She was admitted in June 2020 for hyponatremia thought to be due to possible adrenal insufficiency/hypovolemia.  Also found to have abdominal pain likely due to stenosis of celiac.  She has had telemedicine visit for gastroenterology and has seen endocrinology  Patient reports brief episodes  of chest pain/abdominal pain .  No acute EKG changes.  No signs of surgical abdomen Will give IV fluids and reassess  3:09 AM Pt reports feeling continued weakness She has had some episodes of CP in the ED She will need to be admitted for further evaluation And treatment of hyponatremia D/w dr opyd for  admission  Final Clinical Impressions(s) / ED Diagnoses   Final diagnoses:  Hyponatremia  Precordial pain  Weakness    ED Discharge Orders    None       Zadie RhineWickline, Johntavious Francom, MD 10/27/18 [phone]

## 2018-10-27 ENCOUNTER — Encounter (HOSPITAL_COMMUNITY): Payer: Self-pay | Admitting: Family Medicine

## 2018-10-27 ENCOUNTER — Observation Stay (HOSPITAL_COMMUNITY): Payer: Medicare Other

## 2018-10-27 DIAGNOSIS — K5900 Constipation, unspecified: Secondary | ICD-10-CM | POA: Diagnosis not present

## 2018-10-27 DIAGNOSIS — E871 Hypo-osmolality and hyponatremia: Secondary | ICD-10-CM

## 2018-10-27 DIAGNOSIS — R634 Abnormal weight loss: Secondary | ICD-10-CM | POA: Diagnosis not present

## 2018-10-27 DIAGNOSIS — N289 Disorder of kidney and ureter, unspecified: Secondary | ICD-10-CM

## 2018-10-27 DIAGNOSIS — E119 Type 2 diabetes mellitus without complications: Secondary | ICD-10-CM

## 2018-10-27 DIAGNOSIS — R110 Nausea: Secondary | ICD-10-CM

## 2018-10-27 DIAGNOSIS — I774 Celiac artery compression syndrome: Secondary | ICD-10-CM | POA: Diagnosis not present

## 2018-10-27 DIAGNOSIS — R1084 Generalized abdominal pain: Secondary | ICD-10-CM

## 2018-10-27 DIAGNOSIS — R072 Precordial pain: Secondary | ICD-10-CM

## 2018-10-27 HISTORY — DX: Disorder of kidney and ureter, unspecified: N28.9

## 2018-10-27 HISTORY — DX: Type 2 diabetes mellitus without complications: E11.9

## 2018-10-27 LAB — URINALYSIS, COMPLETE (UACMP) WITH MICROSCOPIC
Bacteria, UA: NONE SEEN
Bilirubin Urine: NEGATIVE
Glucose, UA: NEGATIVE mg/dL
Hgb urine dipstick: NEGATIVE
Ketones, ur: NEGATIVE mg/dL
Leukocytes,Ua: NEGATIVE
Nitrite: NEGATIVE
Protein, ur: NEGATIVE mg/dL
Specific Gravity, Urine: 1.004 — ABNORMAL LOW (ref 1.005–1.030)
pH: 6 (ref 5.0–8.0)

## 2018-10-27 LAB — CBC
HCT: 33.8 % — ABNORMAL LOW (ref 36.0–46.0)
Hemoglobin: 11.3 g/dL — ABNORMAL LOW (ref 12.0–15.0)
MCH: 30.6 pg (ref 26.0–34.0)
MCHC: 33.4 g/dL (ref 30.0–36.0)
MCV: 91.6 fL (ref 80.0–100.0)
Platelets: 295 10*3/uL (ref 150–400)
RBC: 3.69 MIL/uL — ABNORMAL LOW (ref 3.87–5.11)
RDW: 12.8 % (ref 11.5–15.5)
WBC: 6 10*3/uL (ref 4.0–10.5)
nRBC: 0 % (ref 0.0–0.2)

## 2018-10-27 LAB — COMPREHENSIVE METABOLIC PANEL
ALT: 16 U/L (ref 0–44)
AST: 17 U/L (ref 15–41)
Albumin: 3.3 g/dL — ABNORMAL LOW (ref 3.5–5.0)
Alkaline Phosphatase: 82 U/L (ref 38–126)
Anion gap: 8 (ref 5–15)
BUN: 12 mg/dL (ref 8–23)
CO2: 21 mmol/L — ABNORMAL LOW (ref 22–32)
Calcium: 8.9 mg/dL (ref 8.9–10.3)
Chloride: 101 mmol/L (ref 98–111)
Creatinine, Ser: 1.14 mg/dL — ABNORMAL HIGH (ref 0.44–1.00)
GFR calc Af Amer: 55 mL/min — ABNORMAL LOW (ref >60)
GFR calc non Af Amer: 47 mL/min — ABNORMAL LOW (ref >60)
Glucose, Bld: 214 mg/dL — ABNORMAL HIGH (ref 70–99)
Potassium: 4 mmol/L (ref 3.5–5.1)
Sodium: 130 mmol/L — ABNORMAL LOW (ref 135–145)
Total Bilirubin: 0.3 mg/dL (ref 0.3–1.2)
Total Protein: 6.6 g/dL (ref 6.5–8.1)

## 2018-10-27 LAB — GLUCOSE, CAPILLARY
Glucose-Capillary: 127 mg/dL — ABNORMAL HIGH (ref 70–99)
Glucose-Capillary: 122 mg/dL — ABNORMAL HIGH (ref 70–99)
Glucose-Capillary: 175 mg/dL — ABNORMAL HIGH (ref 70–99)
Glucose-Capillary: 124 mg/dL — ABNORMAL HIGH (ref 70–99)
Glucose-Capillary: 126 mg/dL — ABNORMAL HIGH (ref 70–99)

## 2018-10-27 LAB — TROPONIN I (HIGH SENSITIVITY): Troponin I (High Sensitivity): 5 ng/L (ref <18)

## 2018-10-27 LAB — LIPASE, BLOOD: Lipase: 79 U/L — ABNORMAL HIGH (ref 11–51)

## 2018-10-27 LAB — SARS CORONAVIRUS 2 BY RT PCR (HOSPITAL ORDER, PERFORMED IN ~~LOC~~ HOSPITAL LAB): SARS Coronavirus 2: NEGATIVE

## 2018-10-27 LAB — SODIUM, URINE, RANDOM: Sodium, Ur: 62 mmol/L

## 2018-10-27 LAB — CK: Total CK: 28 U/L — ABNORMAL LOW (ref 38–234)

## 2018-10-27 LAB — OSMOLALITY, URINE: Osmolality, Ur: 223 mOsm/kg — ABNORMAL LOW (ref 300–900)

## 2018-10-27 MED ORDER — PROPRANOLOL HCL ER 80 MG PO CP24
80.0000 mg | ORAL_CAPSULE | Freq: Every day | ORAL | Status: DC
  Administered 2018-10-27 – 2018-10-30 (×4): 80 mg via ORAL
  Filled 2018-10-27 (×4): qty 1

## 2018-10-27 MED ORDER — PROMETHAZINE HCL 25 MG PO TABS
12.5000 mg | ORAL_TABLET | Freq: Four times a day (QID) | ORAL | Status: DC | PRN
  Filled 2018-10-27: qty 1

## 2018-10-27 MED ORDER — ACETAMINOPHEN 325 MG PO TABS
650.0000 mg | ORAL_TABLET | Freq: Once | ORAL | Status: AC
  Administered 2018-10-27: 650 mg via ORAL
  Filled 2018-10-27: qty 2

## 2018-10-27 MED ORDER — POLYETHYLENE GLYCOL 3350 17 G PO PACK
17.0000 g | PACK | Freq: Two times a day (BID) | ORAL | Status: DC
  Administered 2018-10-29 – 2018-10-30 (×2): 17 g via ORAL
  Filled 2018-10-27 (×3): qty 1

## 2018-10-27 MED ORDER — ONDANSETRON HCL 4 MG/2ML IJ SOLN
4.0000 mg | Freq: Four times a day (QID) | INTRAMUSCULAR | Status: DC | PRN
  Administered 2018-10-27: 4 mg via INTRAVENOUS
  Filled 2018-10-27: qty 2

## 2018-10-27 MED ORDER — ACETAMINOPHEN 325 MG PO TABS
650.0000 mg | ORAL_TABLET | Freq: Four times a day (QID) | ORAL | Status: DC | PRN
  Administered 2018-10-27 – 2018-10-30 (×7): 650 mg via ORAL
  Filled 2018-10-27 (×7): qty 2

## 2018-10-27 MED ORDER — ENSURE ENLIVE PO LIQD: 237.0000 mL | Freq: Two times a day (BID) | ORAL | Status: DC

## 2018-10-27 MED ORDER — INSULIN ASPART 100 UNIT/ML ~~LOC~~ SOLN: 0.0000 [IU] | Freq: Three times a day (TID) | SUBCUTANEOUS | Status: DC

## 2018-10-27 MED ORDER — ONDANSETRON HCL 4 MG/2ML IJ SOLN
4.0000 mg | Freq: Four times a day (QID) | INTRAMUSCULAR | Status: DC
  Administered 2018-10-27 – 2018-10-30 (×9): 4 mg via INTRAVENOUS
  Filled 2018-10-27 (×9): qty 2

## 2018-10-27 MED ORDER — DOCUSATE SODIUM 100 MG PO CAPS
100.0000 mg | ORAL_CAPSULE | Freq: Two times a day (BID) | ORAL | Status: DC
  Administered 2018-10-27 – 2018-10-30 (×4): 100 mg via ORAL
  Filled 2018-10-27 (×7): qty 1

## 2018-10-27 MED ORDER — SODIUM CHLORIDE 0.9% FLUSH
3.0000 mL | Freq: Two times a day (BID) | INTRAVENOUS | Status: DC
  Administered 2018-10-27 – 2018-10-30 (×6): 3 mL via INTRAVENOUS

## 2018-10-27 MED ORDER — FAMOTIDINE 20 MG PO TABS
10.0000 mg | ORAL_TABLET | Freq: Every day | ORAL | Status: DC
  Administered 2018-10-27 – 2018-10-29 (×3): 10 mg via ORAL
  Filled 2018-10-27 (×3): qty 1

## 2018-10-27 MED ORDER — LINACLOTIDE 72 MCG PO CAPS
72.0000 ug | ORAL_CAPSULE | Freq: Every day | ORAL | Status: DC
  Administered 2018-10-27 – 2018-10-29 (×3): 72 ug via ORAL
  Filled 2018-10-27 (×4): qty 1

## 2018-10-27 MED ORDER — IOHEXOL 300 MG/ML  SOLN
100.0000 mL | Freq: Once | INTRAMUSCULAR | Status: AC | PRN
  Administered 2018-10-27: 100 mL via INTRAVENOUS

## 2018-10-27 MED ORDER — ENOXAPARIN SODIUM 40 MG/0.4ML ~~LOC~~ SOLN
40.0000 mg | Freq: Every day | SUBCUTANEOUS | Status: DC
  Administered 2018-10-27 – 2018-10-29 (×3): 40 mg via SUBCUTANEOUS
  Filled 2018-10-27 (×4): qty 0.4

## 2018-10-27 MED ORDER — PANTOPRAZOLE SODIUM 40 MG PO TBEC
40.0000 mg | DELAYED_RELEASE_TABLET | Freq: Every day | ORAL | Status: DC
  Administered 2018-10-27 – 2018-10-30 (×4): 40 mg via ORAL
  Filled 2018-10-27 (×4): qty 1

## 2018-10-27 MED ORDER — INSULIN ASPART 100 UNIT/ML ~~LOC~~ SOLN: 0.0000 [IU] | Freq: Every day | SUBCUTANEOUS | Status: DC

## 2018-10-27 NOTE — Consult Note (Addendum)
[facility] Consult: 3:37 PM 10/27/2018  LOS: 0 days    Referring Provider: Dr Mahala Menghini  Primary Care Physician:  Leane Call, PA-C Primary Gastroenterologist:  Dr. Chales Abrahams.      Reason for Consultation: Diarrhea in patient with history of IBS-C.   HPI: Jasmine Farrell is a 73 y.o. female.  DM 2.  Mitral valve prolapse, palpitations.  S/p rectocele repair. Gastric ulcer 2004.  IBS.  S/p cholecystectomy for biliary dyskinesia.  Longstanding history of IBS C. 2001 colonoscopy  2017 Cologuard negative.  Patient's had lengthy problem with abdominal pain, nausea, vomiting, weight loss, abdominal pain.  Labs unrevealing. 12/2017 abdominal ultrasound showed fatty changes in the liver, otherwise unremarkable study 01/2018 EGD By Dr. Chales Abrahams for evaluation of epigastric pain.  He found a nonbleeding gastric ulcer with no stigmata of bleeding.  Biopsy negative for H. pylori and celiac disease..  Acute colitis presenting with abdominal bloating, bloody diarrhea.  05/05/2018 CTAP showed bowel thickening at the mid transverse through proximal sigmoid colon.  Ischemic colitis suspected.  Minimal sigmoid diverticulosis. Hospital admission last month with hyponatremia attributed to hypovolemia in the setting of nausea and anorexia since January 2020.  09/16/2018 CT angio abdomen pelvis for nausea, vomiting, nominal pain, weight loss..  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  The configuration of stenosis felt to be mostly on the basis of arcuate ligament compression with some additional atherosclerosis.  Maximal narrowing 75 to 80%.  Patent, normal-appearing SMA, IMA.  No colitis  Virtual visit with Dr. Fredia Sorrow on 10/04/2018 regarding the  (median arcuate ligament syndrome).  He was not convinced that she had this  syndrome as she did not describe the classic symptom of postprandial upper abdominal pain.  He outlined that the only proven procedure for management is surgical release of the arcuate ligament.  Surgery is controversial and typically only performed in rare clinically convincing cases.  Angioplasty and stenting of the celiac axis has proven of no long-term benefit.  But he offered that she could seek the opinion of a vascular surgeon.  Admitted for 3 days in early June with hypovolemic hyponatremia, AKI, normal gap metabolic acidosis.  She was advised to return to the ED yesterday because lab work showed recurrent hyponatremia, sodium 129.  Dr. 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  He started scheduled every 6 hours Zofran. Labs revealed lipase of 79.  LFTs normal. Mild AKI but renal function overall better than it was during the June admission. Sodium today is 130  Patient repeats herself history that the worst of her abdominal symptoms started after the bout of ischemic colitis in January.  Since then she has had worsening of constipation sometimes goes a week without a bowel movement.  Dr. Chales Abrahams prescribed twice daily Colace, twice daily MiraLAX but she still goes several days between bowel movements.  When she does have a bowel movement initially it might be formed but over the course of the next day she will have loose, liquid and watery, stools.  Does not see  blood.  She has abdominal bloating and pain which is not necessarily relieved by the bowel movement.  Chronic nausea and vomiting treated with Zofran which she takes 2 or 3 times a day.  This suppresses the vomiting but she still will get nauseated.  The pain is especially acute in the left upper abdomen  Patient had not had a bowel movement for a week, last evening she had a formed brown stool.  This morning he was given a dose of Linzess, the first time she had it.  She had a soft formed brown stool but  after that had episodes of watery and liquid stool none of which was bloody.  She had some sweats and chills associated with the loose stools, which is not uncommon during these bouts she still has the bloating and abdominal discomfort, holds her hands in the left upper quadrant as the worst area of discomfort.  Weight has gone from 133 pounds in January, on Tuesday she weighed 126 pounds   Past Medical History:  Diagnosis Date   Anemia    Anginal pain (HCC)    Arthritis    Bladder infection    Colitis    Complication of anesthesia    narrow airway per pt can have a bp drop and hard to wake up   Diabetes mellitus without complication (HCC)    Diffuse cystic mastopathy    Diverticulosis    Family history of adverse reaction to anesthesia    brother and sister hard to wake up   GERD (gastroesophageal reflux disease)    Headache    Heart murmur    History of kidney stones    Hyperlipidemia    Mitral valve disorder    MVP   Mitral valve prolapse    Paroxysmal atrial tachycardia (HCC)    PONV (postoperative nausea and vomiting)    PSVT (paroxysmal supraventricular tachycardia) (HCC)    PVC's (premature ventricular contractions)    SVT (supraventricular tachycardia) (HCC)    Varicose veins     Past Surgical History:  Procedure Laterality Date   ABDOMINAL HYSTERECTOMY     ANTERIOR AND POSTERIOR VAGINAL REPAIR     ANTERIOR CERVICAL DECOMP/DISCECTOMY FUSION N/A 08/19/2016   Procedure: ANTERIOR CERVICAL DECOMPRESSION/DISCECTOMY FUSION CERVICAL FOUR- CERVICAL FIVE, CERVICAL FIVE- CERVICAL SIX;  Surgeon: Tressie StalkerJenkins, Jeffrey, MD;  Location: [facility] OR;  Service: Neurosurgery;  Laterality: N/A;  ANTERIOR CERVICAL DECOMPRESSION/DISCECTOMY FUSION CERVICAL 4- CERVICAL 5, CERVICAL 5- CERVICAL6   APPENDECTOMY     BREAST LUMPECTOMY     CHOLECYSTECTOMY     ELBOW SURGERY Right    HERNIA REPAIR     IR RADIOLOGIST EVAL & MGMT  10/04/2018    Prior to Admission medications    Medication Sig Start Date End Date Taking? Authorizing Provider  acetaminophen (TYLENOL) 500 MG tablet Take 500-1,000 mg by mouth every 6 (six) hours as needed for mild pain or headache.    Yes [provider]  Cyanocobalamin (B-12) 2500 MCG TABS Take 2,500 mcg by mouth daily as needed (to boost the immune system). Sublingual   Yes [provider]  docusate sodium (COLACE) 100 MG capsule Take 1 capsule (100 mg total) by mouth daily as needed for mild constipation. Patient taking differently: Take 100 mg by mouth 2 (two) times daily.  09/19/18  Yes Agyei, Hermina Staggersbed K, MD  Famotidine (PEPCID AC PO) Take 1 tablet by mouth at bedtime.   Yes [provider]  lidocaine (LIDODERM) 5 % Place 1 patch onto the  skin daily as needed (for pain). Remove & Discard patch within 12 hours or as directed by MD    Yes [provider]  loratadine (CLARITIN) 10 MG tablet Take 10 mg by mouth daily as needed for allergies or rhinitis.    Yes [provider]  Misc Natural Products (OSTEO BI-FLEX ADV TRIPLE ST PO) Take 1 capsule by mouth daily.    Yes [provider]  ondansetron (ZOFRAN) 8 MG tablet Take 1 tablet (8 mg total) by mouth every 8 (eight) hours as needed for nausea or vomiting. 09/14/18  Yes Jackquline Denmark, MD  pantoprazole (PROTONIX) 40 MG tablet Take 1 tablet (40 mg total) by mouth daily. 09/23/18  Yes Jackquline Denmark, MD  Polyethyl Glycol-Propyl Glycol (SYSTANE ULTRA OP) Place 1 drop into both eyes 2 (two) times daily.   Yes [provider]  polyethylene glycol (MIRALAX / GLYCOLAX) 17 g packet Take 17 g by mouth daily as needed. Patient taking differently: Take 17 g by mouth 2 (two) times daily.  09/19/18  Yes Agyei, Caprice Kluver, MD  propranolol ER (INDERAL LA) 80 MG 24 hr capsule Take 80 mg by mouth daily.   Yes [provider]  vitamin C (ASCORBIC ACID) 500 MG tablet Take 500 mg by mouth daily.   Yes [provider]  linaclotide Rolan Lipa) 72 MCG  capsule Take 1 capsule (72 mcg total) by mouth daily before breakfast. Patient not taking: Reported on 10/27/2018 09/14/18   Jackquline Denmark, MD    Scheduled Meds:  docusate sodium  100 mg Oral BID   enoxaparin (LOVENOX) injection  40 mg Subcutaneous Daily   famotidine  10 mg Oral QHS   feeding supplement (ENSURE ENLIVE)  237 mL Oral BID BM   insulin aspart  0-5 Units Subcutaneous QHS   insulin aspart  0-9 Units Subcutaneous TID WC   linaclotide  72 mcg Oral QAC breakfast   ondansetron (ZOFRAN) IV  4 mg Intravenous Q6H   pantoprazole  40 mg Oral Daily   polyethylene glycol  17 g Oral BID   propranolol ER  80 mg Oral Daily   sodium chloride flush  3 mL Intravenous Once   sodium chloride flush  3 mL Intravenous Q12H   Infusions:  PRN Meds: acetaminophen, promethazine   Allergies as of 10/26/2018 - Review Complete 10/26/2018  Allergen Reaction Noted   Dilaudid [hydromorphone] Shortness Of Breath, Nausea And Vomiting, and Other (See Comments) 08/19/2016   Fentanyl Shortness Of Breath and Nausea And Vomiting 05/05/2018   Latex Anaphylaxis, Rash, and Other (See Comments) 01/02/2013   Morphine and related Shortness Of Breath, Nausea And Vomiting, and Other (See Comments) 01/02/2013   Aspirin Nausea Only 01/02/2013   Codeine Nausea And Vomiting 01/02/2013   Nsaids Nausea Only and Other (See Comments) 01/02/2013   Other Other (See Comments) 05/05/2018   Oxycodone Nausea Only and Other (See Comments) 05/05/2018   Statins Other (See Comments) 01/02/2013   Verapamil Other (See Comments) 05/05/2018   Adhesive [tape] Rash and Other (See Comments) 05/05/2018    Family History  Problem Relation Age of Onset   Esophageal cancer Father    Diverticulitis Sister    Colon cancer Neg Hx    Social History   Social History Narrative   Retired Therapist, sports   Married 2 grown children   No EtOH/tobacco/drugs      REVIEW OF SYSTEMS: Constitutional: Feels tired. ENT:  No  nose bleeds Pulm: No trouble breathing.  No shortness of breath.  No cough. CV:  No palpitations, no LE edema.  General chest pain when the abdominal pain gets bad enough. GU:  No hematuria, no frequency GI: See HPI. Heme: No unusual bleeding or bruising. Transfusions: None Neuro:  No headaches, no peripheral tingling or numbness.  Syncope, no seizures. Derm:  No itching, no rash or sores.  Endocrine:  No polyuria or dysuria Immunization: Reviewed Travel:  None beyond local counties in last few months.    PHYSICAL EXAM: Vital signs in last 24 hours: Vitals:   10/27/18 0422 10/27/18 1502  BP: 125/73 105/68  Pulse: 69 74  Resp: 20 20  Temp: 97.8 F (36.6 C) 97.9 F (36.6 C)  SpO2: 99% 100%   Wt Readings from Last 3 Encounters:  10/27/18 58.5 kg  09/23/18 57.2 kg  09/19/18 60.5 kg    General: Talkative, somewhat anxious, pleasant, non-ill-appearing WF. Head: No facial asymmetry or swelling.  No signs of head trauma. Eyes: No scleral icterus.  No conjunctival pallor.  EOMI. Ears: Not hard of hearing Nose: No congestion or discharge Mouth: Oropharynx moist, pink, clear.  Tongue midline. Neck: No JVD, no masses, no thyromegaly. Lungs: Clear bilaterally.  No cough, no labored breathing. Heart: RRR.  No MRG.  S1, S2 present Abdomen: Soft.  Not distended.  Tenderness is mild to moderate across the mid to left upper quadrant.  There is no rebound or guarding.Marland Kitchen.  No HSM, masses, bruits, hernias.  Hyperactive bowel sounds but no tinkling or tympanitic bowel sounds..   Rectal: Deferred Musc/Skeltl: No joint redness, swelling or gross deformity. Extremities: No lower extremity edema. Neurologic: Oriented x3.  Fluid speech.  Moves all 4 limbs.  No tremor, no gross weakness. Skin: No rash, no sores, no telangiectasia. Nodes: No cervical adenopathy Psych: Somewhat anxious, fluid but somewhat pressured speech.  Pleasant, cooperative.  Not agitated  Intake/Output from previous  day: 07/15 0701 - 07/16 0700 In: 1050 [P.O.:50; IV Piggyback:1000] Out: 700 [Urine:700] Intake/Output this shift: No intake/output data recorded.  LAB RESULTS: Recent Labs    10/26/18 2020 10/27/18 0531  WBC 7.2 6.0  HGB 11.9* 11.3*  HCT 35.1* 33.8*  PLT 333 295   BMET Lab Results  Component Value Date   NA 130 (L) 10/27/2018   NA 129 (L) 10/26/2018   NA 134 (L) 09/19/2018   K 4.0 10/27/2018   K 4.8 10/26/2018   K 4.1 09/19/2018   CL 101 10/27/2018   CL 97 (L) 10/26/2018   CL 105 09/19/2018   CO2 21 (L) 10/27/2018   CO2 20 (L) 10/26/2018   CO2 19 (L) 09/19/2018   GLUCOSE 214 (H) 10/27/2018   GLUCOSE 119 (H) 10/26/2018   GLUCOSE 136 (H) 09/19/2018   BUN 12 10/27/2018   BUN 17 10/26/2018   BUN 11 09/19/2018   CREATININE 1.14 (H) 10/27/2018   CREATININE 1.37 (H) 10/26/2018   CREATININE 1.00 09/19/2018   CALCIUM 8.9 10/27/2018   CALCIUM 10.1 10/26/2018   CALCIUM 9.5 09/19/2018   LFT Recent Labs    10/27/18 0531  PROT 6.6  ALBUMIN 3.3*  AST 17  ALT 16  ALKPHOS 82  BILITOT 0.3   Lipase     Component Value Date/Time   LIPASE 79 (H) 10/27/2018 0531      RADIOLOGY STUDIES: Dg Chest 2 View  Result Date: 10/26/2018 CLINICAL DATA:  73 y/o  F; chest pain. EXAM: CHEST - 2 VIEW COMPARISON:  09/16/2018 chest radiograph FINDINGS: Normal cardiac silhouette. Aortic atherosclerosis with calcification. Stable  small linear opacity at left lung base compatible with scarring or atelectasis. No consolidation. No pleural effusion or pneumothorax. No acute osseous abnormality is evident. Right upper quadrant surgical clips, presumably cholecystectomy. IMPRESSION: No acute pulmonary process identified. Electronically Signed   By: Mitzi HansenLance  Furusawa-Stratton M.D.   On: 10/26/2018 20:56      IMPRESSION:   *   Longstanding history of IBS-C with abdominal pain, bloating, nausea, vomiting.  Symptoms became more frequent and more intense after episode of ischemic colitis in  January. ? median arcuate ligament syndrome.  As noted by Dr. Fredia SorrowYamagata in his phone consult from last month, she does not have the classic postprandial upper abdominal pain.   Linzess obviously successful in relieving constipation as evidenced this morning.  However she may not be able to tolerate it.  I think it is worth having her try another dose tomorrow morning however  *     Hyponatremia.  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   PLAN:     *   Per Dr Alvin CritchleyGessner    Sarah Gribbin  10/27/2018, 3:37 PM Phone [phone]    [facility] Attending   I have taken an interval history, reviewed the chart and examined the patient. I agree with the Advanced Practitioner's note, impression and recommendations.    Additional thoughts:  Chronic abdominal pain and connstipation - worse Chronic nausea Weight loss Sweats Prior transverse and descending colitis - bloody - suspected ischemia - I would think non-occlusive in origin and related to IBS/spasm Median arcuate ligament syndrome - possible but would not likely explain all of her issues Hyponatremia Chronic zofran - may worsen constipation Prior rectocele repeai and some outlet dysfx constipation issues   She should have a colonoscopy - ? Stricture after colitis, ? IBD, ? Other process I will start with a CT abd/pelvis - the CT angio in June did not have oral conrast and she feels worse so will do reg abd/pelvic CT + contrast  Anticipate colonoscopy after that but Na level will come into play as to timing  Keep possibility of Remeron as a treatment for nausea, anorexia and insomnia   Iva Booparl E. Tayvion Lauder, MD, [facility] 10/27/2018 5:43 PM Pager [phone]

## 2018-10-27 NOTE — Progress Notes (Signed)
Spoke to patient nurse to insure that patient was able to complete document.  Paper work was left with nurse would gave it to patient .  Chaplain office will be notified when paperwork is ready.  Jaclynn Major, [place], [facility], Pager [phone]

## 2018-10-27 NOTE — ED Notes (Signed)
ED TO INPATIENT HANDOFF REPORT  ED Nurse Name and Phone #: [phone]   S Name/Age/Gender Jasmine Farrell 73 y.o. female Room/Bed: 035C/035C  Code Status   Code Status: Prior  Home/SNF/Other Home Patient oriented to: self, place, time and situation Is this baseline? Yes   Triage Complete: Triage complete  Chief Complaint hyponatremia  Triage Note Pt states that she went to PCP for follow up and told that her NA was 125 and told to come here, recently admitted for the same last month. Pt states that she is been having some CP, SOB and nausea as well    Allergies Allergies  Allergen Reactions  . Dilaudid [Hydromorphone] Shortness Of Breath, Nausea And Vomiting and Other (See Comments)    "Depressed my respiratory drive and made me hypotensive"  . Fentanyl Shortness Of Breath and Nausea And Vomiting    "Depressed my respiratory drive and made me hypotensive"  . Latex Anaphylaxis, Rash and Other (See Comments)    Caused redness also; BLISTERS THE SKIN (a bite block did this)  . Morphine And Related Shortness Of Breath, Nausea And Vomiting and Other (See Comments)    "Depressed my respiratory drive and made me hypotensive"  . Aspirin Nausea Only    Irritates the stomach  . Codeine Nausea And Vomiting  . Nsaids Nausea Only and Other (See Comments)    These irritate the stomach  . Other Other (See Comments)    Patient is sensitive to "binders" or "fillers" in meds  . Oxycodone Nausea Only and Other (See Comments)    Requires anti-nausea medication to tolerate  . Statins Other (See Comments)    "Made my joints hurt"  . Verapamil Other (See Comments)    "Threw me into V-Tach"  . Adhesive [Tape] Rash and Other (See Comments)    Leaves an "imprint" on the skin    Level of Care/Admitting Diagnosis ED Disposition    ED Disposition Condition [place]: [place] [[id number]]  Level of Care: Telemetry Medical [104]  I expect the patient  will be discharged within 24 hours: Yes  LOW acuity---Tx typically complete <24 hrs---ACUTE conditions typically can be evaluated <24 hours---LABS likely to return to acceptable levels <24 hours---IS near functional baseline---EXPECTED to return to current living arrangement---NOT newly hypoxic: Does not meet criteria for 5C-Observation unit  Covid Evaluation: Asymptomatic Screening Protocol (No Symptoms)  Diagnosis: Hyponatremia [809983]  Admitting Physician: Vianne Bulls [[id number]]  Attending Physician: Vianne Bulls [[id number]]  PT Class (Do Not Modify): Observation [104]  PT Acc Code (Do Not Modify): Observation [10022]       B Medical/Surgery History Past Medical History:  Diagnosis Date  . Anemia   . Anginal pain ([place])   . Arthritis   . Bladder infection   . Colitis   . Complication of anesthesia    narrow airway per pt can have a bp drop and hard to wake up  . Diabetes mellitus without complication ([place])   . Diffuse cystic mastopathy   . Diverticulosis   . Family history of adverse reaction to anesthesia    brother and sister hard to wake up  . GERD (gastroesophageal reflux disease)   . Headache   . Heart murmur   . History of kidney stones   . Hyperlipidemia   . Mitral valve disorder    MVP  . Mitral valve prolapse   . Paroxysmal atrial tachycardia ([place])   . PONV (postoperative nausea  and vomiting)   . PSVT (paroxysmal supraventricular tachycardia) (HCC)   . PVC's (premature ventricular contractions)   . SVT (supraventricular tachycardia) (HCC)   . Varicose veins    Past Surgical History:  Procedure Laterality Date  . ABDOMINAL HYSTERECTOMY    . ANTERIOR AND POSTERIOR VAGINAL REPAIR    . ANTERIOR CERVICAL DECOMP/DISCECTOMY FUSION N/A 08/19/2016   Procedure: ANTERIOR CERVICAL DECOMPRESSION/DISCECTOMY FUSION CERVICAL FOUR- CERVICAL FIVE, CERVICAL FIVE- CERVICAL SIX;  Surgeon: Tressie StalkerJenkins, Jeffrey, MD;  [facility];  Service: Neurosurgery;  Laterality: N/A;   ANTERIOR CERVICAL DECOMPRESSION/DISCECTOMY FUSION CERVICAL 4- CERVICAL 5, CERVICAL 5- CERVICAL6  . APPENDECTOMY    . BREAST LUMPECTOMY    . CHOLECYSTECTOMY    . ELBOW SURGERY Right   . HERNIA REPAIR    . IR RADIOLOGIST EVAL & MGMT  10/04/2018     A IV Location/Drains/Wounds Patient Lines/Drains/Airways Status   Active Line/Drains/Airways    Name:   Placement date:   Placement time:   Site:   Days:   Peripheral IV 10/26/18 Left Forearm   10/26/18    2321    Forearm   1   Incision (Closed) 08/19/16 Neck   08/19/16    1411     799          Intake/Output Last 24 hours  Intake/Output Summary (Last 24 hours) at 10/27/2018 [id number] Last data filed at 10/27/2018 0120 Gross per 24 hour  Intake 1000 ml  Output -  Net 1000 ml    Labs/Imaging Results for orders placed or performed during the hospital encounter of 10/26/18 (from the past 48 hour(s))  Basic metabolic panel     Status: Abnormal   Collection Time: 10/26/18  8:20 PM  Result Value Ref Range   Sodium 129 (L) 135 - 145 mmol/L   Potassium 4.8 3.5 - 5.1 mmol/L   Chloride 97 (L) 98 - 111 mmol/L   CO2 20 (L) 22 - 32 mmol/L   Glucose, Bld 119 (H) 70 - 99 mg/dL   BUN 17 8 - 23 mg/dL   Creatinine, Ser 9.601.37 (H) 0.44 - 1.00 mg/dL   Calcium 45.410.1 8.9 - 09.810.3 mg/dL   GFR calc non Af Amer 38 (L) >60 mL/min   GFR calc Af Amer 44 (L) >60 mL/min   Anion gap 12 5 - 15    Comment: Performed at [facility], [address] [place], [place] [address]  CBC     Status: Abnormal   Collection Time: 10/26/18  8:20 PM  Result Value Ref Range   WBC 7.2 4.0 - 10.5 K/uL   RBC 3.83 (L) 3.87 - 5.11 MIL/uL   Hemoglobin 11.9 (L) 12.0 - 15.0 g/dL   HCT 78.235.1 (L) 95.636.0 - 21.346.0 %   MCV 91.6 80.0 - 100.0 fL   MCH 31.1 26.0 - 34.0 pg   MCHC 33.9 30.0 - 36.0 g/dL   RDW 08.613.1 57.811.5 - 46.915.5 %   Platelets 333 150 - 400 K/uL   nRBC 0.0 0.0 - 0.2 %    Comment: Performed at [facility], [address] [place], [place] [address]  Troponin I (High  Sensitivity)     Status: None   Collection Time: 10/26/18  8:20 PM  Result Value Ref Range   Troponin I (High Sensitivity) 2 <18 ng/L    Comment: (NOTE) Elevated high sensitivity troponin I (hsTnI) values and significant  changes across serial measurements may suggest ACS but many other  chronic and  acute conditions are known to elevate hsTnI results.  Refer to the "Links" section for chest pain algorithms and additional  guidance. Performed at [facility], [address] [place], [place] [address]   Troponin I (High Sensitivity)     Status: None   Collection Time: 10/26/18 11:18 PM  Result Value Ref Range   Troponin I (High Sensitivity) 5 <18 ng/L    Comment: (NOTE) Elevated high sensitivity troponin I (hsTnI) values and significant  changes across serial measurements may suggest ACS but many other  chronic and acute conditions are known to elevate hsTnI results.  Refer to the "Links" section for chest pain algorithms and additional  guidance. Performed at [facility], [address] [place], [place] [address]   CK     Status: Abnormal   Collection Time: 10/26/18 11:18 PM  Result Value Ref Range   Total CK 28 (L) 38 - 234 U/L    Comment: Performed at [facility], [address] [place], [place] [address]   Dg Chest 2 View  Result Date: 10/26/2018 CLINICAL DATA:  73 y/o  F; chest pain. EXAM: CHEST - 2 VIEW COMPARISON:  09/16/2018 chest radiograph FINDINGS: Normal cardiac silhouette. Aortic atherosclerosis with calcification. Stable small linear opacity at left lung base compatible with scarring or atelectasis. No consolidation. No pleural effusion or pneumothorax. No acute osseous abnormality is evident. Right upper quadrant surgical clips, presumably cholecystectomy. IMPRESSION: No acute pulmonary process identified. Electronically Signed   By: Mitzi HansenLance  Furusawa-Stratton M.D.   On: 10/26/2018 20:56    Pending Labs Unresulted Labs (From admission, onward)     Start     Ordered   10/27/18 0145  SARS Coronavirus 2 (CEPHEID - Performed in [facility] hospital lab), Hosp Order  (Asymptomatic Patients Labs)  Once,   STAT    Question:  Rule Out  Answer:  Yes   10/27/18 0144          Vitals/Pain Today's Vitals   10/27/18 0000 10/27/18 0015 10/27/18 0030 10/27/18 0100  BP: (!) 142/79 122/79 (!) 144/87 121/79  Pulse: 68 67 85 87  Resp: 15 18 20 20   Temp:      TempSrc:      SpO2: 100% 98% 98% 100%  PainSc:        Isolation Precautions No active isolations  Medications Medications  sodium chloride flush (NS) 0.9 % injection 3 mL (3 mLs Intravenous Not Given 10/26/18 2243)  sodium chloride 0.9 % bolus 1,000 mL (0 mLs Intravenous Stopped 10/27/18 0120)  ondansetron (ZOFRAN) injection 4 mg (4 mg Intravenous Given 10/27/18 0000)  acetaminophen (TYLENOL) tablet 650 mg (650 mg Oral Given 10/27/18 0058)    Mobility walks with person assist Low fall risk   Focused Assessments   R Recommendations: See Admitting Provider Note  Report given to:   Additional Notes:

## 2018-10-27 NOTE — H&P (Signed)
History and Physical    Jasmine Spryhyllis C Kasper ZOX:[id number] DOB: Sep 22, 1945 DOA: 10/26/2018  PCP: Leane CallNodal, Jr Reinaldo, PA-C   Patient coming from: Home   Chief Complaint: Generalized weakness, nausea, abdominal pain   HPI: Jasmine Farrell is a 73 y.o. female with medical history significant for type 2 diabetes mellitus, mitral valve prolapse and palpitations, GERD, IBS, and abdominal pain with nausea and anorexia since January of this year, now presenting to the emergency department for evaluation of hyponatremia.  Patient was admitted to the hospital last month with sodium of 121, increased to 134 by time of discharge, suspected to be hypovolemic at that time but also had low a.m. cortisol and low urine osmolality with urine sodium of 59.  She had been generally weak and her nausea had worsened prior to the admission last month and she began to experience the same sort of symptoms over the past several days.  She had outpatient blood work performed, was told that her sodium was low again, and directed to the ED for further evaluation of this.  Patient reports that she continues to have intermittent pain in her abdomen, mainly in the left upper quadrant most recently, cramping and sharp in character, associated with severe nausea and anorexia, and not particularly worse after meal.  She denies any recent fevers, chills, shortness breath, or cough.  She denies diarrhea.  She reports chronic constipation.  She had a KUB yesterday without evidence for obstruction or ileus.  ED Course: Upon arrival to the ED, patient is found to be afebrile, saturating well on room air, and with stable blood pressure.  EKG features normal sinus rhythm and chest x-ray is negative for acute cardiopulmonary disease.  Chemistry panel is notable for sodium of 129 and creatinine 1.37, up from 1.0 last month.  CBC features a mild normocytic anemia.  High-sensitivity troponin is normal x2 three hours apart.  Patient was given a liter  of normal saline, Zofran, and acetaminophen in the emergency department and the hospitalists are consulted for admission.  Review of Systems:  All other systems reviewed and apart from HPI, are negative.  Past Medical History:  Diagnosis Date   Anemia    Anginal pain (HCC)    Arthritis    Bladder infection    Colitis    Complication of anesthesia    narrow airway per pt can have a bp drop and hard to wake up   Diabetes mellitus without complication (HCC)    Diffuse cystic mastopathy    Diverticulosis    Family history of adverse reaction to anesthesia    brother and sister hard to wake up   GERD (gastroesophageal reflux disease)    Headache    Heart murmur    History of kidney stones    Hyperlipidemia    Mitral valve disorder    MVP   Mitral valve prolapse    Paroxysmal atrial tachycardia (HCC)    PONV (postoperative nausea and vomiting)    PSVT (paroxysmal supraventricular tachycardia) (HCC)    PVC's (premature ventricular contractions)    SVT (supraventricular tachycardia) (HCC)    Varicose veins     Past Surgical History:  Procedure Laterality Date   ABDOMINAL HYSTERECTOMY     ANTERIOR AND POSTERIOR VAGINAL REPAIR     ANTERIOR CERVICAL DECOMP/DISCECTOMY FUSION N/A 08/19/2016   Procedure: ANTERIOR CERVICAL DECOMPRESSION/DISCECTOMY FUSION CERVICAL FOUR- CERVICAL FIVE, CERVICAL FIVE- CERVICAL SIX;  Surgeon: Tressie StalkerJenkins, Jeffrey, MD;  Location: [facility] OR;  Service: Neurosurgery;  Laterality: N/A;  ANTERIOR CERVICAL DECOMPRESSION/DISCECTOMY FUSION CERVICAL 4- CERVICAL 5, CERVICAL 5- CERVICAL6   APPENDECTOMY     BREAST LUMPECTOMY     CHOLECYSTECTOMY     ELBOW SURGERY Right    HERNIA REPAIR     IR RADIOLOGIST EVAL & MGMT  10/04/2018     reports that she has never smoked. She has never used smokeless tobacco. She reports that she does not drink alcohol or use drugs.  Allergies  Allergen Reactions   Dilaudid [Hydromorphone] Shortness Of Breath,  Nausea And Vomiting and Other (See Comments)    "Depressed my respiratory drive and made me hypotensive"   Fentanyl Shortness Of Breath and Nausea And Vomiting    "Depressed my respiratory drive and made me hypotensive"   Latex Anaphylaxis, Rash and Other (See Comments)    Caused redness also; BLISTERS THE SKIN (a bite block did this)   Morphine And Related Shortness Of Breath, Nausea And Vomiting and Other (See Comments)    "Depressed my respiratory drive and made me hypotensive"   Aspirin Nausea Only    Irritates the stomach   Codeine Nausea And Vomiting   Nsaids Nausea Only and Other (See Comments)    These irritate the stomach   Other Other (See Comments)    Patient is sensitive to "binders" or "fillers" in meds   Oxycodone Nausea Only and Other (See Comments)    Requires anti-nausea medication to tolerate   Statins Other (See Comments)    "Made my joints hurt"   Verapamil Other (See Comments)    "Threw me into V-Tach"   Adhesive [Tape] Rash and Other (See Comments)    Leaves an "imprint" on the skin    Family History  Problem Relation Age of Onset   Esophageal cancer Father    Diverticulitis Sister    Colon cancer Neg Hx      Prior to Admission medications   Medication Sig Start Date End Date Taking? Authorizing Provider  acetaminophen (TYLENOL) 500 MG tablet Take 500-1,000 mg by mouth every 6 (six) hours as needed for mild pain or headache.    Yes [provider]  Cyanocobalamin (B-12) 2500 MCG TABS Take 2,500 mcg by mouth daily as needed (to boost the immune system). Sublingual   Yes [provider]  docusate sodium (COLACE) 100 MG capsule Take 1 capsule (100 mg total) by mouth daily as needed for mild constipation. Patient taking differently: Take 100 mg by mouth 2 (two) times daily.  09/19/18  Yes Agyei, Hermina Staggersbed K, MD  Famotidine (PEPCID AC PO) Take 1 tablet by mouth at bedtime.   Yes [provider]  lidocaine (LIDODERM) 5 % Place  1 patch onto the skin daily as needed (for pain). Remove & Discard patch within 12 hours or as directed by MD    Yes [provider]  loratadine (CLARITIN) 10 MG tablet Take 10 mg by mouth daily as needed for allergies or rhinitis.    Yes [provider]  Misc Natural Products (OSTEO BI-FLEX ADV TRIPLE ST PO) Take 1 capsule by mouth daily.    Yes [provider]  ondansetron (ZOFRAN) 8 MG tablet Take 1 tablet (8 mg total) by mouth every 8 (eight) hours as needed for nausea or vomiting. 09/14/18  Yes Lynann BolognaGupta, Rajesh, MD  pantoprazole (PROTONIX) 40 MG tablet Take 1 tablet (40 mg total) by mouth daily. 09/23/18  Yes Lynann BolognaGupta, Rajesh, MD  Polyethyl Glycol-Propyl Glycol (SYSTANE ULTRA OP) Place 1 drop into both eyes 2 (two)  times daily.   Yes [provider]  polyethylene glycol (MIRALAX / GLYCOLAX) 17 g packet Take 17 g by mouth daily as needed. Patient taking differently: Take 17 g by mouth 2 (two) times daily.  09/19/18  Yes Agyei, Caprice Kluver, MD  propranolol ER (INDERAL LA) 80 MG 24 hr capsule Take 80 mg by mouth daily.   Yes [provider]  vitamin C (ASCORBIC ACID) 500 MG tablet Take 500 mg by mouth daily.   Yes [provider]  linaclotide (LINZESS) 72 MCG capsule Take 1 capsule (72 mcg total) by mouth daily before breakfast. Patient not taking: Reported on 10/27/2018 09/14/18   Jackquline Denmark, MD    Physical Exam: Vitals:   10/27/18 0000 10/27/18 0015 10/27/18 0030 10/27/18 0100  BP: (!) 142/79 122/79 (!) 144/87 121/79  Pulse: 68 67 85 87  Resp: 15 18 20 20   Temp:      TempSrc:      SpO2: 100% 98% 98% 100%    Constitutional: NAD, calm  Eyes: PERTLA, lids and conjunctivae normal ENMT: Mucous membranes are moist. Posterior pharynx clear of any exudate or lesions.   Neck: normal, supple, no masses, no thyromegaly Respiratory: no wheezing, no crackles. Normal respiratory effort. No accessory muscle use.  Cardiovascular: S1 & S2 heard, regular rate and  rhythm. No extremity edema.  Abdomen: No distension, soft, tender in upper abdomen without rebound pain or guarding. Bowel sounds normal.  Musculoskeletal: no clubbing / cyanosis. No joint deformity upper and lower extremities.    Skin: no significant rashes, lesions, ulcers. Warm, dry, well-perfused. Neurologic: CN 2-12 grossly intact. Sensation intact. Strength 5/5 in all 4 limbs.  Psychiatric: Alert and oriented x 3. Very talkative, cooperative.    Labs on Admission: I have personally reviewed following labs and imaging studies  CBC: Recent Labs  Lab 10/26/18 2020  WBC 7.2  HGB 11.9*  HCT 35.1*  MCV 91.6  PLT 539   Basic Metabolic Panel: Recent Labs  Lab 10/26/18 2020  NA 129*  K 4.8  CL 97*  CO2 20*  GLUCOSE 119*  BUN 17  CREATININE 1.37*  CALCIUM 10.1   GFR: CrCl cannot be calculated (Unknown ideal weight.). Liver Function Tests: No results for input(s): AST, ALT, ALKPHOS, BILITOT, PROT, ALBUMIN in the last 168 hours. No results for input(s): LIPASE, AMYLASE in the last 168 hours. No results for input(s): AMMONIA in the last 168 hours. Coagulation Profile: No results for input(s): INR, PROTIME in the last 168 hours. Cardiac Enzymes: Recent Labs  Lab 10/26/18 2318  CKTOTAL 28*   BNP (last 3 results) No results for input(s): PROBNP in the last 8760 hours. HbA1C: No results for input(s): HGBA1C in the last 72 hours. CBG: No results for input(s): GLUCAP in the last 168 hours. Lipid Profile: No results for input(s): CHOL, HDL, LDLCALC, TRIG, CHOLHDL, LDLDIRECT in the last 72 hours. Thyroid Function Tests: No results for input(s): TSH, T4TOTAL, FREET4, T3FREE, THYROIDAB in the last 72 hours. Anemia Panel: No results for input(s): VITAMINB12, FOLATE, FERRITIN, TIBC, IRON, RETICCTPCT in the last 72 hours. Urine analysis:    Component Value Date/Time   COLORURINE STRAW (A) 09/16/2018 1610   APPEARANCEUR CLEAR 09/16/2018 1610   LABSPEC 1.027 09/16/2018 1610     PHURINE 6.0 09/16/2018 1610   GLUCOSEU NEGATIVE 09/16/2018 1610   HGBUR NEGATIVE 09/16/2018 1610   BILIRUBINUR NEGATIVE 09/16/2018 1610   KETONESUR NEGATIVE 09/16/2018 1610   PROTEINUR NEGATIVE 09/16/2018 1610   NITRITE NEGATIVE  09/16/2018 1610   LEUKOCYTESUR NEGATIVE 09/16/2018 1610   Sepsis Labs: @LABRCNTIP (procalcitonin:4,lacticidven:4) )No results found for this or any previous visit (from the past 240 hour(s)).   Radiological Exams on Admission: Dg Chest 2 View  Result Date: 10/26/2018 CLINICAL DATA:  73 y/o  F; chest pain. EXAM: CHEST - 2 VIEW COMPARISON:  09/16/2018 chest radiograph FINDINGS: Normal cardiac silhouette. Aortic atherosclerosis with calcification. Stable small linear opacity at left lung base compatible with scarring or atelectasis. No consolidation. No pleural effusion or pneumothorax. No acute osseous abnormality is evident. Right upper quadrant surgical clips, presumably cholecystectomy. IMPRESSION: No acute pulmonary process identified. Electronically Signed   By: Mitzi HansenLance  Furusawa-Stratton M.D.   On: 10/26/2018 20:56    EKG: Independently reviewed. Normal sinus rhythm, rate 72, QTc 400 ms.   Assessment/Plan   1. Hyponatremia  - Presents reports recent generalized weakness and worsening in her chronic nausea, similar to when she was hyponatremic in June, and is found to have sodium 125 on outpatient labs  - No headache or lethargy, she is alert and very talkative  - Workup last month with slightly low urine osm, urine sodium 59, normal TSH, low am cortisol but appropriate ACTH stim test, most suggestive of SIADH   - She [facility]  - She is hypovolemic on admission in setting of anorexia, and she was given a liter of NS  - Repeat chem panel now that she has received a liter NS, restrict free-water, continue gentle IVF hydration with NS pending repeat chem panel   2. 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 Mild renal insufficiency  - SCr is 1.37 on admission, up from 1.0 on 09/19/18  - Possibly acute prerenal azotemia in setting of nausea and anorexia  - She was given a liter of NS in ED and continued on IVF hydration, will repeat chem panel in am    4. Type II DM  - A1c was 6.8% in December 2019   - Metformin was stopped recently d/t nausea - Check CBG's and use a low-intensity SSI with Novolog as needed while in hospital   5. Chest pain  - Patient reports occasional chest pains when asked specifically, lasting a couple seconds at a time, not exertional, low suspicion for ACS  - No acute ischemic features on EKG, CXR unremarkable, and troponin normal x2     PPE: Mask, face shield. Patient wearing mask.  DVT prophylaxis: sq heparin  Code Status: Full  Family Communication: Discussed with patient  Consults called: none  Admission status: Observation     Briscoe Deutscherimothy S Seylah Wernert, MD [facility] Pager [phone]  If 7PM-7AM, please contact night-coverage [email or web address] Password [email or web address]  10/27/2018, 3:36 AM

## 2018-10-27 NOTE — Progress Notes (Signed)
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   I really appreciate input from Dr. Arelia Longest in advance with regards to modalities to treat her IBS D, she has been historically difficult to manage it sounds like per her conversation with me as Dr. 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

## 2018-10-27 NOTE — ED Notes (Signed)
ortostatic vs: 147/90 lying, 167/86 sitting, 173/112 standing

## 2018-10-28 DIAGNOSIS — Z888 Allergy status to other drugs, medicaments and biological substances status: Secondary | ICD-10-CM | POA: Diagnosis not present

## 2018-10-28 DIAGNOSIS — Z20828 Contact with and (suspected) exposure to other viral communicable diseases: Secondary | ICD-10-CM | POA: Diagnosis present

## 2018-10-28 DIAGNOSIS — Z91040 Latex allergy status: Secondary | ICD-10-CM | POA: Diagnosis not present

## 2018-10-28 DIAGNOSIS — E872 Acidosis: Secondary | ICD-10-CM | POA: Diagnosis present

## 2018-10-28 DIAGNOSIS — Z91048 Other nonmedicinal substance allergy status: Secondary | ICD-10-CM | POA: Diagnosis not present

## 2018-10-28 DIAGNOSIS — E222 Syndrome of inappropriate secretion of antidiuretic hormone: Secondary | ICD-10-CM | POA: Diagnosis present

## 2018-10-28 DIAGNOSIS — E119 Type 2 diabetes mellitus without complications: Secondary | ICD-10-CM | POA: Diagnosis present

## 2018-10-28 DIAGNOSIS — D649 Anemia, unspecified: Secondary | ICD-10-CM | POA: Diagnosis present

## 2018-10-28 DIAGNOSIS — R634 Abnormal weight loss: Secondary | ICD-10-CM | POA: Diagnosis not present

## 2018-10-28 DIAGNOSIS — I341 Nonrheumatic mitral (valve) prolapse: Secondary | ICD-10-CM | POA: Diagnosis present

## 2018-10-28 DIAGNOSIS — R531 Weakness: Secondary | ICD-10-CM

## 2018-10-28 DIAGNOSIS — Z885 Allergy status to narcotic agent status: Secondary | ICD-10-CM | POA: Diagnosis not present

## 2018-10-28 DIAGNOSIS — R1084 Generalized abdominal pain: Secondary | ICD-10-CM | POA: Diagnosis not present

## 2018-10-28 DIAGNOSIS — Z886 Allergy status to analgesic agent status: Secondary | ICD-10-CM | POA: Diagnosis not present

## 2018-10-28 DIAGNOSIS — K5900 Constipation, unspecified: Secondary | ICD-10-CM | POA: Diagnosis not present

## 2018-10-28 DIAGNOSIS — E861 Hypovolemia: Secondary | ICD-10-CM | POA: Diagnosis present

## 2018-10-28 DIAGNOSIS — I774 Celiac artery compression syndrome: Secondary | ICD-10-CM | POA: Diagnosis present

## 2018-10-28 DIAGNOSIS — K219 Gastro-esophageal reflux disease without esophagitis: Secondary | ICD-10-CM | POA: Diagnosis present

## 2018-10-28 DIAGNOSIS — Z9049 Acquired absence of other specified parts of digestive tract: Secondary | ICD-10-CM | POA: Diagnosis not present

## 2018-10-28 DIAGNOSIS — Z79899 Other long term (current) drug therapy: Secondary | ICD-10-CM | POA: Diagnosis not present

## 2018-10-28 DIAGNOSIS — N179 Acute kidney failure, unspecified: Secondary | ICD-10-CM | POA: Diagnosis present

## 2018-10-28 DIAGNOSIS — R072 Precordial pain: Secondary | ICD-10-CM | POA: Diagnosis present

## 2018-10-28 DIAGNOSIS — Z8719 Personal history of other diseases of the digestive system: Secondary | ICD-10-CM | POA: Diagnosis not present

## 2018-10-28 DIAGNOSIS — Z7984 Long term (current) use of oral hypoglycemic drugs: Secondary | ICD-10-CM | POA: Diagnosis not present

## 2018-10-28 DIAGNOSIS — K259 Gastric ulcer, unspecified as acute or chronic, without hemorrhage or perforation: Secondary | ICD-10-CM | POA: Diagnosis present

## 2018-10-28 DIAGNOSIS — K581 Irritable bowel syndrome with constipation: Secondary | ICD-10-CM | POA: Diagnosis present

## 2018-10-28 DIAGNOSIS — K760 Fatty (change of) liver, not elsewhere classified: Secondary | ICD-10-CM | POA: Diagnosis present

## 2018-10-28 DIAGNOSIS — R110 Nausea: Secondary | ICD-10-CM | POA: Diagnosis not present

## 2018-10-28 DIAGNOSIS — I10 Essential (primary) hypertension: Secondary | ICD-10-CM | POA: Diagnosis present

## 2018-10-28 DIAGNOSIS — E871 Hypo-osmolality and hyponatremia: Secondary | ICD-10-CM | POA: Diagnosis not present

## 2018-10-28 DIAGNOSIS — R194 Change in bowel habit: Secondary | ICD-10-CM | POA: Diagnosis not present

## 2018-10-28 LAB — CBC WITH DIFFERENTIAL/PLATELET
Abs Immature Granulocytes: 0.02 10*3/uL (ref 0.00–0.07)
Basophils Absolute: 0.1 10*3/uL (ref 0.0–0.1)
Basophils Relative: 1 %
Eosinophils Absolute: 0.6 10*3/uL — ABNORMAL HIGH (ref 0.0–0.5)
Eosinophils Relative: 9 %
HCT: 33.6 % — ABNORMAL LOW (ref 36.0–46.0)
Hemoglobin: 11.4 g/dL — ABNORMAL LOW (ref 12.0–15.0)
Immature Granulocytes: 0 %
Lymphocytes Relative: 29 %
Lymphs Abs: 1.8 10*3/uL (ref 0.7–4.0)
MCH: 30.5 pg (ref 26.0–34.0)
MCHC: 33.9 g/dL (ref 30.0–36.0)
MCV: 89.8 fL (ref 80.0–100.0)
Monocytes Absolute: 0.8 10*3/uL (ref 0.1–1.0)
Monocytes Relative: 13 %
Neutro Abs: 3 10*3/uL (ref 1.7–7.7)
Neutrophils Relative %: 48 %
Platelets: 294 10*3/uL (ref 150–400)
RBC: 3.74 MIL/uL — ABNORMAL LOW (ref 3.87–5.11)
RDW: 12.8 % (ref 11.5–15.5)
WBC: 6.2 10*3/uL (ref 4.0–10.5)
nRBC: 0 % (ref 0.0–0.2)

## 2018-10-28 LAB — BASIC METABOLIC PANEL
Anion gap: 7 (ref 5–15)
BUN: 12 mg/dL (ref 8–23)
CO2: 21 mmol/L — ABNORMAL LOW (ref 22–32)
Calcium: 8.9 mg/dL (ref 8.9–10.3)
Chloride: 101 mmol/L (ref 98–111)
Creatinine, Ser: 1.11 mg/dL — ABNORMAL HIGH (ref 0.44–1.00)
GFR calc Af Amer: 57 mL/min — ABNORMAL LOW (ref >60)
GFR calc non Af Amer: 49 mL/min — ABNORMAL LOW (ref >60)
Glucose, Bld: 116 mg/dL — ABNORMAL HIGH (ref 70–99)
Potassium: 4.1 mmol/L (ref 3.5–5.1)
Sodium: 129 mmol/L — ABNORMAL LOW (ref 135–145)

## 2018-10-28 LAB — GLUCOSE, CAPILLARY
Glucose-Capillary: 128 mg/dL — ABNORMAL HIGH (ref 70–99)
Glucose-Capillary: 129 mg/dL — ABNORMAL HIGH (ref 70–99)
Glucose-Capillary: 74 mg/dL (ref 70–99)
Glucose-Capillary: 105 mg/dL — ABNORMAL HIGH (ref 70–99)

## 2018-10-28 LAB — OSMOLALITY, URINE: Osmolality, Ur: 262 mOsm/kg — ABNORMAL LOW (ref 300–900)

## 2018-10-28 LAB — SODIUM, URINE, RANDOM: Sodium, Ur: 38 mmol/L

## 2018-10-28 MED ORDER — POLYETHYLENE GLYCOL 3350 17 GM/SCOOP PO POWD
0.5000 | Freq: Once | ORAL | Status: AC
  Administered 2018-10-28: 18:00:00 127.5 g via ORAL

## 2018-10-28 MED ORDER — LACTATED RINGERS IV SOLN
INTRAVENOUS | Status: DC
  Administered 2018-10-28 – 2018-10-29 (×2): via INTRAVENOUS

## 2018-10-28 MED ORDER — METOCLOPRAMIDE HCL 5 MG/ML IJ SOLN
10.0000 mg | Freq: Once | INTRAMUSCULAR | Status: AC
  Administered 2018-10-28: 10 mg via INTRAVENOUS
  Filled 2018-10-28: qty 2

## 2018-10-28 MED ORDER — SODIUM CHLORIDE 1 G PO TABS
1.0000 g | ORAL_TABLET | Freq: Two times a day (BID) | ORAL | Status: DC
  Administered 2018-10-28 – 2018-10-30 (×4): 1 g via ORAL
  Filled 2018-10-28 (×5): qty 1

## 2018-10-28 MED ORDER — POLYETHYLENE GLYCOL 3350 17 GM/SCOOP PO POWD
0.5000 | Freq: Once | ORAL | Status: AC
  Administered 2018-10-28: 127.5 g via ORAL
  Filled 2018-10-28 (×2): qty 255

## 2018-10-28 MED ORDER — SODIUM CHLORIDE 0.9 % IV SOLN: INTRAVENOUS | Status: DC

## 2018-10-28 NOTE — Progress Notes (Addendum)
Daily Rounding Note  10/28/2018, 10:50 AM  LOS: 0 days   SUBJECTIVE:   Chief complaint:    IBS.   Several stools, soft to loose, non-bloody this AM after Linzess (dose # 2).   Nausea and pain LUQ persist but a little better  OBJECTIVE:         Vital signs in last 24 hours:    Temp:  [97.4 F (36.3 C)-97.9 F (36.6 C)] 97.7 F (36.5 C) (07/17 0636) Pulse Rate:  [62-74] 62 (07/17 0636) Resp:  [18-20] 18 (07/17 0636) BP: (105-136)/(68-84) 112/70 (07/17 0636) SpO2:  [98 %-100 %] 98 % (07/17 0636) Weight:  [57.1 kg] 57.1 kg (07/17 0636) Last BM Date: 10/27/18 Filed Weights   10/27/18 0418 10/28/18 0636  Weight: 58.5 kg 57.1 kg   General: anxious, alert, not acutely ill looking.  comfortable   Heart: RRR Chest: clear bil Abdomen: Hyperactive BS, tender without g/r in LUQ.  Slight distention Extremities: no CCE Neuro/Psych:  Anxious.  Alert.  Detailed historian.    Lab Results: Recent Labs    10/26/18 2020 10/27/18 0531 10/28/18 0547  WBC 7.2 6.0 6.2  HGB 11.9* 11.3* 11.4*  HCT 35.1* 33.8* 33.6*  PLT 333 295 294   BMET Recent Labs    10/26/18 2020 10/27/18 0531 10/28/18 0547  NA 129* 130* 129*  K 4.8 4.0 4.1  CL 97* 101 101  CO2 20* 21* 21*  GLUCOSE 119* 214* 116*  BUN 17 12 12   CREATININE 1.37* 1.14* 1.11*  CALCIUM 10.1 8.9 8.9     Studies/Results: Dg Chest 2 View  Result Date: 10/26/2018 CLINICAL DATA:  73 y/o  F; chest pain. EXAM: CHEST - 2 VIEW COMPARISON:  09/16/2018 chest radiograph FINDINGS: Normal cardiac silhouette. Aortic atherosclerosis with calcification. Stable small linear opacity at left lung base compatible with scarring or atelectasis. No consolidation. No pleural effusion or pneumothorax. No acute osseous abnormality is evident. Right upper quadrant surgical clips, presumably cholecystectomy. IMPRESSION: No acute pulmonary process identified. Electronically Signed   By: Mitzi HansenLance   Furusawa-Stratton M.D.   On: 10/26/2018 20:56   Ct Abdomen Pelvis W Contrast  Result Date: 10/27/2018 CLINICAL DATA:  73 year old female with abdominal pain, weight loss. Colitis in January. EXAM: CT ABDOMEN AND PELVIS WITH CONTRAST TECHNIQUE: Multidetector CT imaging of the abdomen and pelvis was performed using the standard protocol following bolus administration of intravenous contrast. CONTRAST:  100mL OMNIPAQUE IOHEXOL 300 MG/ML  SOLN COMPARISON:  CTA abdomen and pelvis 09/16/2018, CT Abdomen and Pelvis 05/05/2018. FINDINGS: Lower chest: Linear scarring or atelectasis in the lower lobes is stable. No pericardial or pleural effusion. Hepatobiliary: Surgically absent gallbladder as before. Negative liver. Pancreas: Negative. Spleen: Negative. Adrenals/Urinary Tract: Normal adrenal glands. Stable and negative kidneys. Symmetric renal enhancement and contrast excretion. Normal proximal ureters. Unremarkable urinary bladder. Stomach/Bowel: Redundant distal sigmoid colon with diverticulosis. No definite sigmoid or rectal active inflammation. Mild retained stool upstream. But fairly decompressed large bowel overall. Oral contrast has reached the ascending colon. Negative terminal ileum. Diminutive or absent appendix. The right colon appears mildly indistinct (coronal image 66), but there is no convincing mesenteric stranding. No dilated large bowel. Negative stomach and duodenum. No free air, free fluid. Vascular/Lymphatic: Aortoiliac calcified atherosclerosis. Major arterial structures in the abdomen and pelvis remain patent. Portal venous system appears patent. No lymphadenopathy. Reproductive: Surgically absent uterus. Diminutive or absent ovaries. Other: No pelvic free fluid. Musculoskeletal: No acute osseous abnormality identified. IMPRESSION: No  definite bowel inflammation. No acute or inflammatory process identified. Electronically Signed   By: Genevie Ann M.D.   On: 10/27/2018 22:24     ASSESMENT:   *    Longstanding history of IBS-C with abdominal pain, bloating, nausea, vomiting.  Symptoms more frequent and intense after ischemic colitis in January. ? median arcuate ligament syndrome.  As noted by Dr. Kathlene Cote in his phone consult from last month, she does not have the classic postprandial upper abdominal pain.    CTAP 7/16 with no intestinal process identified, so unlikely recurrent ischemic colitis or post colitis stricture.     Linzess successful in addressing constipation, colonic inertia.  Problem is she can not afford to pay for this.  *     Hyponatremia.   Persists.    *   AKI.  Improved.     PLAN   *   Non-urgent colonoscopy, likely will defer to outpt setting.  Bowel prep may exacerbate hyponatremia.    *  Continue Linzess for now. Perhaps this could be used 2 or 3 times a week to address the cost?     Azucena Freed  10/28/2018, 10:50 AM Phone [place] Attending   I have taken an interval history, reviewed the chart and examined the patient. I agree with the Advanced Practitioner's note, impression and recommendations.   She is slightly better Still w/ nausea and sweats/hot flashes  Na is overall stable and I think now that I know that it is not declining have decided to pursue colonoscopy - I think delaying that will still leave [place] wondering what is going on though likely to have functional GI disturbance.  So will do colonoscopy tomorrow.  Further plans pending that  IVF support  Gatha Mayer, MD, [facility] 10/28/2018 4:50 PM Pager [phone]

## 2018-10-28 NOTE — H&P (View-Only) (Signed)
° °       Daily Rounding Note ° °10/28/2018, 10:50 AM ° LOS: 0 days  ° °SUBJECTIVE:   °Chief complaint:    IBS.   °Several stools, soft to loose, non-bloody this AM after Linzess (dose # 2).   °Nausea and pain LUQ persist but a little better ° °OBJECTIVE:        ° Vital signs in last 24 hours:    °Temp:  [97.4 °F (36.3 °C)-97.9 °F (36.6 °C)] 97.7 °F (36.5 °C) (07/17 0636) °Pulse Rate:  [62-74] 62 (07/17 0636) °Resp:  [18-20] 18 (07/17 0636) °BP: (105-136)/(68-84) 112/70 (07/17 0636) °SpO2:  [98 %-100 %] 98 % (07/17 0636) °Weight:  [57.1 kg] 57.1 kg (07/17 0636) °Last BM Date: 10/27/18 °Filed Weights  ° 10/27/18 0418 10/28/18 0636  °Weight: 58.5 kg 57.1 kg  ° °General: anxious, alert, not acutely ill looking.  comfortable   °Heart: RRR °Chest: clear bil °Abdomen: Hyperactive BS, tender without g/r in LUQ.  Slight distention °Extremities: no CCE °Neuro/Psych:  Anxious.  Alert.  Detailed historian.   ° °Lab Results: °Recent Labs  °  10/26/18 °2020 10/27/18 °0531 10/28/18 °0547  °WBC 7.2 6.0 6.2  °HGB 11.9* 11.3* 11.4*  °HCT 35.1* 33.8* 33.6*  °PLT 333 295 294  ° °BMET °Recent Labs  °  10/26/18 °2020 10/27/18 °0531 10/28/18 °0547  °NA 129* 130* 129*  °K 4.8 4.0 4.1  °CL 97* 101 101  °CO2 20* 21* 21*  °GLUCOSE 119* 214* 116*  °BUN 17 12 12  °CREATININE 1.37* 1.14* 1.11*  °CALCIUM 10.1 8.9 8.9  ° ° ° °Studies/Results: °Dg Chest 2 View ° °Result Date: 10/26/2018 °CLINICAL DATA:  73 y/o  F; chest pain. EXAM: CHEST - 2 VIEW COMPARISON:  09/16/2018 chest radiograph FINDINGS: Normal cardiac silhouette. Aortic atherosclerosis with calcification. Stable small linear opacity at left lung base compatible with scarring or atelectasis. No consolidation. No pleural effusion or pneumothorax. No acute osseous abnormality is evident. Right upper quadrant surgical clips, presumably cholecystectomy. IMPRESSION: No acute pulmonary process identified. Electronically Signed   By: Lance   Furusawa-Stratton M.D.   On: 10/26/2018 20:56  ° °Ct Abdomen Pelvis W Contrast ° °Result Date: 10/27/2018 °CLINICAL DATA:  73-year-old female with abdominal pain, weight loss. Colitis in January. EXAM: CT ABDOMEN AND PELVIS WITH CONTRAST TECHNIQUE: Multidetector CT imaging of the abdomen and pelvis was performed using the standard protocol following bolus administration of intravenous contrast. CONTRAST:  100mL OMNIPAQUE IOHEXOL 300 MG/ML  SOLN COMPARISON:  CTA abdomen and pelvis 09/16/2018, CT Abdomen and Pelvis 05/05/2018. FINDINGS: Lower chest: Linear scarring or atelectasis in the lower lobes is stable. No pericardial or pleural effusion. Hepatobiliary: Surgically absent gallbladder as before. Negative liver. Pancreas: Negative. Spleen: Negative. Adrenals/Urinary Tract: Normal adrenal glands. Stable and negative kidneys. Symmetric renal enhancement and contrast excretion. Normal proximal ureters. Unremarkable urinary bladder. Stomach/Bowel: Redundant distal sigmoid colon with diverticulosis. No definite sigmoid or rectal active inflammation. Mild retained stool upstream. But fairly decompressed large bowel overall. Oral contrast has reached the ascending colon. Negative terminal ileum. Diminutive or absent appendix. The right colon appears mildly indistinct (coronal image 66), but there is no convincing mesenteric stranding. No dilated large bowel. Negative stomach and duodenum. No free air, free fluid. Vascular/Lymphatic: Aortoiliac calcified atherosclerosis. Major arterial structures in the abdomen and pelvis remain patent. Portal venous system appears patent. No lymphadenopathy. Reproductive: Surgically absent uterus. Diminutive or absent ovaries. Other: No pelvic free fluid. Musculoskeletal: No acute osseous abnormality identified. IMPRESSION: No   definite bowel inflammation. No acute or inflammatory process identified. Electronically Signed   By: Genevie Ann M.D.   On: 10/27/2018 22:24     ASSESMENT:   *    Longstanding history of IBS-C with abdominal pain, bloating, nausea, vomiting.  Symptoms more frequent and intense after ischemic colitis in January. ? median arcuate ligament syndrome.  As noted by Dr. Kathlene Cote in his phone consult from last month, she does not have the classic postprandial upper abdominal pain.    CTAP 7/16 with no intestinal process identified, so unlikely recurrent ischemic colitis or post colitis stricture.     Linzess successful in addressing constipation, colonic inertia.  Problem is she can not afford to pay for this.  *     Hyponatremia.   Persists.    *   AKI.  Improved.     PLAN   *   Non-urgent colonoscopy, likely will defer to outpt setting.  Bowel prep may exacerbate hyponatremia.    *  Continue Linzess for now. Perhaps this could be used 2 or 3 times a week to address the cost?     Azucena Freed  10/28/2018, 10:50 AM Phone [place] Attending   I have taken an interval history, reviewed the chart and examined the patient. I agree with the Advanced Practitioner's note, impression and recommendations.   She is slightly better Still w/ nausea and sweats/hot flashes  Na is overall stable and I think now that I know that it is not declining have decided to pursue colonoscopy - I think delaying that will still leave [place] wondering what is going on though likely to have functional GI disturbance.  So will do colonoscopy tomorrow.  Further plans pending that  IVF support  Gatha Mayer, MD, [facility] 10/28/2018 4:50 PM Pager [phone]

## 2018-10-28 NOTE — Care Management Obs Status (Signed)
[facility] NOTIFICATION   Patient Details  Name: ABBEE CREMEENS MRN: [id number] Date of Birth: 03-11-1946   Medicare Observation Status Notification Given:  Yes    Bethena Roys, RN 10/28/2018, 11:49 AM

## 2018-10-28 NOTE — Progress Notes (Addendum)
PROGRESS NOTE  Jasmine Farrell IHK:[id number] DOB: [birth year] DOA: 10/26/2018 PCP: Maggie Schwalbe, PA-C  Brief History   73 year old Caucasian female DM TY 2, gastric ulcer 2004 with IBS, cholecystectomy plus biliary dyskinesia, fatty liver 2019, EGD 2019 gastric ulcer ?  Admit 2/2 ischemic colitis 05/05/2018-->CTA 09/2018 =75-85% stenosis proximal celiac trunk-Dr. Kathlene Cote IR evaluated patient DDX median arcuate ligament syndrome??  Appropriate candidate for angios/stent to celiac access  Recent admit 09/2018 hypovolemic hyponatremia [place]-work-up at the time revealed normal cortisol levels  Continues to have hyponatremia-GI consulted secondary to management of IBS and questionable need for intervention on celiac stenosis-patient claims to have never gotten better after index admission 05/05/2018 and has had persisting diarrhea since then  A & P  IBS Defer planning including medications on discharge to GI-appreciate input hypovolemic hyponatremia Non-gap acidosis Mucosa slightly dry-increase solute by adding salt tabs-her urine studies repeated today more suggestive hypovolemic hyponatremia versus reset Osmo stat/T toast potomania-may have to accept sodium in the 1 30-1 32 range-if no better will consult nephrology in a.m. Last normal CO2 05/10/2018 and has not been normal since-underlying primary issue driving this likely could be diarrhea with loss of bicarb-monitor trends Prior gastric ulcer Continue pantoprazole 40 daily-might need to discontinue as can be associated with SIADH Fatty liver Outpatient follow-up with GI Biliary dyskinesia status post cholecystectomy DM TY 2 sugars well controlled 120s eating 60% of meals Continue sliding scale  Lovenox Full code Inpatient Communicated with patient directly   Verneita Griffes, MD [facility] 10:38 AM  10/28/2018, 10:38 AM  LOS: 0 days   Consultants  . Gastroenterology  Procedures  . None yet  Antibiotics  . None   Interval History/Subjective  Nausea most of this morning per patient Tolerated however some diet with the help of Zofran did not want to use Phenergan Has had 2 good bowel movements with Linzess and seems to like this-please see GI thoughts separately for further planning  Objective   Vitals:  Vitals:   10/27/18 2110 10/28/18 0636  BP: 136/84 112/70  Pulse: 70 62  Resp: 18 18  Temp: (!) 97.4 F (36.3 C) 97.7 F (36.5 C)  SpO2: 100% 98%    Exam:  Awake alert looking a little bit better rested-less anxious and talkative than previously and more coherent with less tangentiality EOMI NCAT arcus senilis no pallor no icterus Chest clinically clear no added sound S1-S2 no murmur, on monitors seems to be sinus rhythm Abdomen is soft nontender no rebound no guarding Neurologically intact no focal deficit cranial nerves grossly normal 2 through 12   I have personally reviewed the following:   Today's Data  . Sodium 129, bicarb up to 21  BUN/creatinine down from admission 17/1.3-->12/1.1  Albumin 3.3  Lipase 79  Hemoglobin 11.4 and in usual baseline  Lab Data  . As above  [employer]  . None  Imaging  . None  Cardiology Data  . No  Other Data  . No  Scheduled Meds: . docusate sodium  100 mg Oral BID  . enoxaparin (LOVENOX) injection  40 mg Subcutaneous Daily  . famotidine  10 mg Oral QHS  . feeding supplement (ENSURE ENLIVE)  237 mL Oral BID BM  . insulin aspart  0-5 Units Subcutaneous QHS  . insulin aspart  0-9 Units Subcutaneous TID WC  . linaclotide  72 mcg Oral QAC breakfast  . ondansetron (ZOFRAN) IV  4 mg Intravenous Q6H  . pantoprazole  40 mg Oral Daily  .  polyethylene glycol  17 g Oral BID  . propranolol ER  80 mg Oral Daily  . sodium chloride flush  3 mL Intravenous Once  . sodium chloride flush  3 mL Intravenous Q12H   Continuous Infusions:  Principal Problem:   Hyponatremia Active Problems:   Celiac artery stenosis (HCC)   Mild renal  insufficiency   Diabetes mellitus type II, non insulin dependent (HCC)   Precordial pain   Generalized abdominal pain   Constipation   Loss of weight   Chronic nausea   LOS: 0 days   How to contact the [facility] Attending or Consulting provider 7A - 7P or covering provider during after hours 7P -7A, for this patient?  1. Check the care team in [facility] and look for a) attending/consulting [facility] provider listed and b) the [facility] team listed 2. Log into [email or web address] and use [place] universal password to access. If you do not have the password, please contact the hospital operator. 3. Locate the [facility] provider you are looking for under [facility] and page to a number that you can be directly reached. 4. If you still have difficulty reaching the provider, please page the [facility] (Director [facility] Call) for the Hospitalists listed on amion for assistance.

## 2018-10-29 ENCOUNTER — Encounter (HOSPITAL_COMMUNITY)
Admission: EM | Disposition: A | Discharge status: Home / Self Care | Payer: Self-pay | Source: Home / Self Care | Attending: Family Medicine

## 2018-10-29 ENCOUNTER — Encounter (HOSPITAL_COMMUNITY): Payer: Self-pay | Admitting: Emergency Medicine

## 2018-10-29 ENCOUNTER — Inpatient Hospital Stay (HOSPITAL_COMMUNITY): Payer: Medicare Other | Admitting: Certified Registered Nurse Anesthetist

## 2018-10-29 DIAGNOSIS — R194 Change in bowel habit: Secondary | ICD-10-CM

## 2018-10-29 DIAGNOSIS — R110 Nausea: Secondary | ICD-10-CM

## 2018-10-29 DIAGNOSIS — R634 Abnormal weight loss: Secondary | ICD-10-CM

## 2018-10-29 DIAGNOSIS — R531 Weakness: Secondary | ICD-10-CM

## 2018-10-29 DIAGNOSIS — R1084 Generalized abdominal pain: Secondary | ICD-10-CM

## 2018-10-29 DIAGNOSIS — K5900 Constipation, unspecified: Secondary | ICD-10-CM

## 2018-10-29 HISTORY — PX: COLONOSCOPY WITH PROPOFOL: SHX5780

## 2018-10-29 LAB — GLUCOSE, CAPILLARY
Glucose-Capillary: 117 mg/dL — ABNORMAL HIGH (ref 70–99)
Glucose-Capillary: 133 mg/dL — ABNORMAL HIGH (ref 70–99)
Glucose-Capillary: 213 mg/dL — ABNORMAL HIGH (ref 70–99)
Glucose-Capillary: 91 mg/dL (ref 70–99)

## 2018-10-29 LAB — RENAL FUNCTION PANEL
Albumin: 3.4 g/dL — ABNORMAL LOW (ref 3.5–5.0)
Anion gap: 8 (ref 5–15)
BUN: 11 mg/dL (ref 8–23)
CO2: 23 mmol/L (ref 22–32)
Calcium: 9.3 mg/dL (ref 8.9–10.3)
Chloride: 103 mmol/L (ref 98–111)
Creatinine, Ser: 0.99 mg/dL (ref 0.44–1.00)
GFR calc Af Amer: >60 mL/min (ref >60)
GFR calc non Af Amer: 56 mL/min — ABNORMAL LOW (ref >60)
Glucose, Bld: 118 mg/dL — ABNORMAL HIGH (ref 70–99)
Phosphorus: 2.7 mg/dL (ref 2.5–4.6)
Potassium: 4.2 mmol/L (ref 3.5–5.1)
Sodium: 134 mmol/L — ABNORMAL LOW (ref 135–145)

## 2018-10-29 SURGERY — COLONOSCOPY WITH PROPOFOL
Anesthesia: Monitor Anesthesia Care

## 2018-10-29 MED ORDER — PROPOFOL 500 MG/50ML IV EMUL
INTRAVENOUS | Status: DC | PRN
  Administered 2018-10-29: 50 ug/kg/min via INTRAVENOUS

## 2018-10-29 MED ORDER — PROPOFOL 10 MG/ML IV BOLUS
INTRAVENOUS | Status: DC | PRN
  Administered 2018-10-29 (×2): 15 mg via INTRAVENOUS
  Administered 2018-10-29 (×2): 10 mg via INTRAVENOUS

## 2018-10-29 MED ORDER — MIRTAZAPINE 7.5 MG PO TABS
7.5000 mg | ORAL_TABLET | Freq: Every day | ORAL | Status: DC
  Administered 2018-10-29: 7.5 mg via ORAL
  Filled 2018-10-29: qty 1

## 2018-10-29 SURGICAL SUPPLY — 22 items

## 2018-10-29 NOTE — Anesthesia Postprocedure Evaluation (Signed)
Anesthesia Post Note  Patient: MILCA SYTSMA  Procedure(s) Performed: COLONOSCOPY WITH PROPOFOL (N/A )     Patient location during evaluation: Endoscopy Anesthesia Type: MAC Level of consciousness: awake and alert Pain management: pain level controlled Vital Signs Assessment: post-procedure vital signs reviewed and stable Respiratory status: spontaneous breathing, nonlabored ventilation, respiratory function stable and patient connected to nasal cannula oxygen Cardiovascular status: stable and blood pressure returned to baseline Postop Assessment: no apparent nausea or vomiting Anesthetic complications: no    Last Vitals:  Vitals:   10/29/18 1131 10/29/18 1303  BP: (!) 114/59 129/78  Pulse: 68 65  Resp: 14 14  Temp:  36.6 C  SpO2: 96% 100%    Last Pain:  Vitals:   10/29/18 1607  TempSrc:   PainSc: 1                  Sakiyah Shur

## 2018-10-29 NOTE — Transfer of Care (Signed)
Immediate Anesthesia Transfer of Care Note  Patient: Jasmine Farrell  Procedure(s) Performed: COLONOSCOPY WITH PROPOFOL (N/A )  Patient Location: Endoscopy Unit  Anesthesia Type:MAC  Level of Consciousness: drowsy and patient cooperative  Airway & Oxygen Therapy: Patient Spontanous Breathing  Post-op Assessment: Report given to RN and Post -op Vital signs reviewed and stable  Post vital signs: Reviewed and stable  Last Vitals:  Vitals Value Taken Time  BP    Temp    Pulse    Resp    SpO2      Last Pain:  Vitals:   10/29/18 1015  TempSrc: Oral  PainSc: 5       Patients Stated Pain Goal: 0 (21/11/73 5670)  Complications: No apparent anesthesia complications

## 2018-10-29 NOTE — Anesthesia Procedure Notes (Signed)
Procedure Name: MAC Date/Time: 10/29/2018 11:00 AM Performed by: Elayne Snare, CRNA Pre-anesthesia Checklist: Patient identified, Emergency Drugs available, Suction available and Patient being monitored Patient Re-evaluated:Patient Re-evaluated prior to induction Oxygen Delivery Method: Simple face mask

## 2018-10-29 NOTE — Anesthesia Preprocedure Evaluation (Addendum)
Anesthesia Evaluation  Patient identified by MRN, date of birth, ID band Patient awake    Reviewed: Allergy & Precautions, NPO status , Patient's Chart, lab work & pertinent test results  History of Anesthesia Complications (+) PONV and history of anesthetic complications  Airway Mallampati: III  TM Distance: <3 FB Neck ROM: Limited    Dental  (+) Dental Advisory Given   Pulmonary    breath sounds clear to auscultation       Cardiovascular + angina  Rhythm:Regular     Neuro/Psych  Headaches,    GI/Hepatic GERD  ,  Endo/Other  diabetes  Renal/GU Renal disease     Musculoskeletal  (+) Arthritis ,   Abdominal   Peds  Hematology  (+) anemia ,   Anesthesia Other Findings   Reproductive/Obstetrics                                                             Anesthesia Evaluation  Patient identified by MRN, date of birth, ID band Patient awake    Reviewed: Allergy & Precautions, NPO status , Patient's Chart, lab work & pertinent test results, reviewed documented beta blocker date and time   History of Anesthesia Complications (+) PONV  Airway Mallampati: II  TM Distance: >3 FB Neck ROM: Full    Dental  (+) Edentulous Upper, Implants, Caps, Dental Advisory Given, Partial Lower   Pulmonary neg pulmonary ROS,    breath sounds clear to auscultation       Cardiovascular hypertension, Pt. on home beta blockers (-) CAD + dysrhythmias (h/o PVCs: controlled with propranolol)  Rhythm:Regular Rate:Normal  '08 stress: No ischemia seen on the scan. Normal gated imaging. Normal ejection fraction. h/o Cath: normal coronaries   Neuro/Psych  Headaches, negative psych ROS   GI/Hepatic Neg liver ROS, GERD  Medicated,  Endo/Other  diabetes (glu 129), Oral Hypoglycemic Agents  Renal/GU negative Renal ROS     Musculoskeletal  (+) Arthritis , Osteoarthritis,    Abdominal    Peds  Hematology negative hematology ROS (+)   Anesthesia Other Findings   Reproductive/Obstetrics                           Anesthesia Physical Anesthesia Plan  ASA: III  Anesthesia Plan: General   Post-op Pain Management:    Induction: Intravenous  Airway Management Planned: Oral ETT and Video Laryngoscope Planned  Additional Equipment:   Intra-op Plan:   Post-operative Plan: Extubation in OR  Informed Consent: I have reviewed the patients History and Physical, chart, labs and discussed the procedure including the risks, benefits and alternatives for the proposed anesthesia with the patient or authorized representative who has indicated his/her understanding and acceptance.   Dental advisory given  Plan Discussed with: CRNA and Surgeon  Anesthesia Plan Comments: (Plan routine monitors, GETA with VideoGlide intubation)        Anesthesia Quick Evaluation  Anesthesia Physical Anesthesia Plan  ASA: III  Anesthesia Plan: MAC   Post-op Pain Management:    Induction: Intravenous  PONV Risk Score and Plan: 3 and Treatment may vary due to age or medical condition and Propofol infusion  Airway Management Planned: Nasal Cannula  Additional Equipment: None  Intra-op Plan:   Post-operative Plan:   Informed Consent:  I have reviewed the patients History and Physical, chart, labs and discussed the procedure including the risks, benefits and alternatives for the proposed anesthesia with the patient or authorized representative who has indicated his/her understanding and acceptance.     Dental advisory given  Plan Discussed with: CRNA and Surgeon  Anesthesia Plan Comments:         Anesthesia Quick Evaluation

## 2018-10-29 NOTE — Interval H&P Note (Signed)
History and Physical Interval Note:  10/29/2018 10:47 AM  Jasmine Farrell  has presented today for surgery, with the diagnosis of Change in bowel habits.  The various methods of treatment have been discussed with the patient and family. After consideration of risks, benefits and other options for treatment, the patient has consented to  Procedure(s): COLONOSCOPY WITH PROPOFOL (N/A) as a surgical intervention.  The patient's history has been reviewed, patient examined, no change in status, stable for surgery.  I have reviewed the patient's chart and labs.  Questions were answered to the patient's satisfaction.     Silvano Rusk

## 2018-10-29 NOTE — Progress Notes (Signed)
PROGRESS NOTE  TAREA SKILLMAN LFY:[id number] DOB: 1946/03/11 DOA: 10/26/2018 PCP: Maggie Schwalbe, PA-C  Brief History   73 year old Caucasian female DM TY 2, gastric ulcer 2004 with IBS, cholecystectomy plus biliary dyskinesia, fatty liver 2019, EGD 2019 gastric ulcer ?  Admit 2/2 ischemic colitis 05/05/2018-->CTA 09/2018 =75-85% stenosis proximal celiac trunk-Dr. Kathlene Cote IR evaluated patient DDX median arcuate ligament syndrome??  Appropriate candidate for angios/stent to celiac access  Recent admit 09/2018 hypovolemic hyponatremia [place]-work-up at the time revealed normal cortisol levels  Continues to have hyponatremia-GI consulted secondary to management of IBS and questionable need for intervention on celiac stenosis-patient claims to have never gotten better after index admission 05/05/2018 and has had persisting diarrhea since then  A & P  IBS seeiming worse after Prior ?isch colitis 04/2018 with some celiac plaqeu--non steting candidate--DDx median arcuate lig synd Defer planning including medications on discharge to GI-appreciate input Colonoscopy 7/18 pending hypovolemic hyponatremia/reset osmostat Non-gap acidosis Improved-as OP continue salt tab and fluid restrict slightly Last normal CO2 05/10/2018 and has not been normal since---management of IBS and N will help correct Prior gastric ulcer Continue pantoprazole 40 daily Fatty liver Outpatient follow-up with GI Biliary dyskinesia status post cholecystectomy DM TY 2 Resume diet as per gi once procedure done  Lovenox Full code Inpatient Communicated with patient directly---could possibly d/c home am?   Verneita Griffes, MD [facility] 10:25 AM  10/29/2018, 10:25 AM  LOS: 1 day   Consultants  . Gastroenterology  Procedures  . None yet  Antibiotics  . None  Interval History/Subjective   Better About to go to colonoscopy  Objective   Vitals:  Vitals:   10/29/18 0856 10/29/18 1015  BP: (!) 127/94  (!) 171/80  Pulse: 73 72  Resp:  16  Temp:  98.1 F (36.7 C)  SpO2:  97%    Exam:  Coherent rested ao x 4 Moist mucosa\ cta b No added sound abd obese nt nd   I have personally reviewed the following:   Today's Data  . Sodium 129--?134, bicarb up to 21-->23  BUN/creatinine down from admission 17/1.3-->12/1.1--11/0.99   Lab Data  . As above  [employer]  . None  Imaging  . None  Cardiology Data  . No  Other Data  . No  Scheduled Meds: . [MAR Hold] docusate sodium  100 mg Oral BID  . [MAR Hold] enoxaparin (LOVENOX) injection  40 mg Subcutaneous Daily  . [MAR Hold] famotidine  10 mg Oral QHS  . [MAR Hold] feeding supplement (ENSURE ENLIVE)  237 mL Oral BID BM  . [MAR Hold] insulin aspart  0-5 Units Subcutaneous QHS  . [MAR Hold] insulin aspart  0-9 Units Subcutaneous TID WC  . [MAR Hold] linaclotide  72 mcg Oral QAC breakfast  . [MAR Hold] ondansetron (ZOFRAN) IV  4 mg Intravenous Q6H  . [MAR Hold] pantoprazole  40 mg Oral Daily  . [MAR Hold] polyethylene glycol  17 g Oral BID  . [MAR Hold] propranolol ER  80 mg Oral Daily  . [MAR Hold] sodium chloride flush  3 mL Intravenous Once  . [MAR Hold] sodium chloride flush  3 mL Intravenous Q12H  . [MAR Hold] sodium chloride  1 g Oral BID WC   Continuous Infusions: . lactated ringers 75 mL/hr at 10/29/18 0906    Principal Problem:   Hyponatremia Active Problems:   Celiac artery stenosis (HCC)   Mild renal insufficiency   Diabetes mellitus type II, non insulin dependent ([place])  Precordial pain   Generalized abdominal pain   Constipation   Loss of weight   Chronic nausea   Weakness   LOS: 1 day   How to contact the [facility] Attending or Consulting provider 7A - 7P or covering provider during after hours 7P -7A, for this patient?  1. Check the care team in [facility] and look for a) attending/consulting [facility] provider listed and b) the [facility] team listed 2. Log into [email or web address] and use [place]'s universal password  to access. If you do not have the password, please contact the hospital operator. 3. Locate the [facility] provider you are looking for under [facility] and page to a number that you can be directly reached. 4. If you still have difficulty reaching the provider, please page the [facility] ([facility]) for the Hospitalists listed on amion for assistance.

## 2018-10-29 NOTE — Op Note (Addendum)
[facility] Patient Name: Jasmine Farrell Procedure Date : 10/29/2018 MRN: [id number] Attending MD: Gatha Mayer , MD Date of Birth: 08-Mar-1946 CSN: [id number] Age: 73 Admit Type: Inpatient Procedure:                Colonoscopy Indications:              Generalized abdominal pain, Change in bowel habits,                            Weight loss Providers:                Gatha Mayer, MD, Carlyn Reichert, RN, Laverda Sorenson, Technician, Lavona Mound, CRNA Referring MD:              Medicines:                Propofol per Anesthesia, Monitored Anesthesia Care Complications:            No immediate complications. Estimated Blood Loss:     Estimated blood loss: none. Procedure:                Pre-Anesthesia Assessment:                           - Prior to the procedure, a History and Physical                            was performed, and patient medications and                            allergies were reviewed. The patient's tolerance of                            previous anesthesia was also reviewed. The risks                            and benefits of the procedure and the sedation                            options and risks were discussed with the patient.                            All questions were answered, and informed consent                            was obtained. Prior Anticoagulants: The patient has                            taken no previous anticoagulant or antiplatelet                            agents. ASA Grade Assessment: III - A patient with  severe systemic disease. After reviewing the risks                            and benefits, the patient was deemed in                            satisfactory condition to undergo the procedure.                           After obtaining informed consent, the colonoscope                            was passed under direct vision. Throughout the         procedure, the patient's blood pressure, pulse, and                            oxygen saturations were monitored continuously. The                            PCF-[id number] ([id number] Olympus pediatric colonoscope                            was introduced through the anus and advanced to the                            the terminal ileum, with identification of the                            appendiceal orifice and IC valve. The colonoscopy                            was performed without difficulty. The patient                            tolerated the procedure well. The quality of the                            bowel preparation was excellent. The bowel                            preparation used was Miralax via split dose                            instruction. The terminal ileum, ileocecal valve,                            appendiceal orifice, and rectum were photographed. Scope In: 11:03:57 AM Scope Out: 11:16:55 AM Scope Withdrawal Time: 0 hours 8 minutes 52 seconds  Total Procedure Duration: 0 hours 12 minutes 58 seconds  Findings:      The perianal and digital rectal examinations were normal.      The entire examined colon appeared normal on direct and retroflexion       views. Impression:               -  The entire examined colon is normal on direct and                            retroflexion views.                           - No specimens collected. Recommendation:           - Return patient to hospital ward for ongoing care.                           - Soft diet.                           - No repeat colonoscopy due to current age [age]                            years or older).                           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 Chales AbrahamsGupta after dc If not set up we will                            arrange it                           take Linzess at least severeal times a week to  promote defecation                           f/u endocrinology as planned                           DC when ready per [facility] - today ok from GI stanpoint Procedure Code(s):        --- Professional ---                           [phone], Colonoscopy, flexible; diagnostic, including                            collection of specimen(s) by brushing or washing,                            when performed (separate procedure) Diagnosis Code(s):        --- Professional ---                           R10.84, Generalized abdominal pain                           R19.4, Change in bowel habit                           R63.4, Abnormal weight loss CPT copyright 2019 [employer]. All rights reserved. The codes documented in this report are preliminary and upon coder review may  be revised to meet current compliance requirements. Iva Booparl E Obediah Welles, MD 10/29/2018 11:27:46 AM This report has been signed electronically. Number of Addenda: 0

## 2018-10-30 ENCOUNTER — Other Ambulatory Visit: Payer: Self-pay

## 2018-10-30 ENCOUNTER — Encounter (HOSPITAL_COMMUNITY): Payer: Self-pay | Admitting: Internal Medicine

## 2018-10-30 LAB — RENAL FUNCTION PANEL
Albumin: 3.4 g/dL — ABNORMAL LOW (ref 3.5–5.0)
Anion gap: 9 (ref 5–15)
BUN: 11 mg/dL (ref 8–23)
CO2: 23 mmol/L (ref 22–32)
Calcium: 9.4 mg/dL (ref 8.9–10.3)
Chloride: 106 mmol/L (ref 98–111)
Creatinine, Ser: 1.05 mg/dL — ABNORMAL HIGH (ref 0.44–1.00)
GFR calc Af Amer: >60 mL/min (ref >60)
GFR calc non Af Amer: 52 mL/min — ABNORMAL LOW (ref >60)
Glucose, Bld: 108 mg/dL — ABNORMAL HIGH (ref 70–99)
Phosphorus: 3.3 mg/dL (ref 2.5–4.6)
Potassium: 4.5 mmol/L (ref 3.5–5.1)
Sodium: 138 mmol/L (ref 135–145)

## 2018-10-30 LAB — GLUCOSE, CAPILLARY
Glucose-Capillary: 94 mg/dL (ref 70–99)
Glucose-Capillary: 102 mg/dL — ABNORMAL HIGH (ref 70–99)

## 2018-10-30 MED ORDER — SODIUM CHLORIDE 1 G PO TABS: 1.0000 g | ORAL_TABLET | Freq: Two times a day (BID) | ORAL | 3 refills | Status: DC

## 2018-10-30 MED ORDER — LINACLOTIDE 72 MCG PO CAPS: 72.0000 ug | ORAL_CAPSULE | Freq: Every day | ORAL | 11 refills | Status: DC

## 2018-10-30 MED ORDER — MIRTAZAPINE 7.5 MG PO TABS: 7.5000 mg | ORAL_TABLET | Freq: Every day | ORAL | 3 refills | Status: DC

## 2018-10-30 NOTE — Discharge Summary (Signed)
Physician Discharge Summary  Jasmine Farrell:[id number] DOB: [birth year] DOA: 10/26/2018  PCP: Leane Call, PA-C  Admit date: 10/26/2018 Discharge date: 10/30/2018  Time spent: 40 minutes  Recommendations for Outpatient Follow-up:  1. Salt tablets, Remeron, other changes to [facility] as noted 2. Please ensure fluid limits of 1000 cc are adhered to as she has SIADH 3. Recommend outpatient follow-up with gastroenterology 4. May need samples of Linzess in the outpatient setting  Discharge Diagnoses:  Principal Problem:   Hyponatremia Active Problems:   Celiac artery stenosis (HCC)   Mild renal insufficiency   Diabetes mellitus type II, non insulin dependent (HCC)   Precordial pain   Generalized abdominal pain   Constipation   Loss of weight   Chronic nausea   Weakness   Change in bowel habit   Discharge Condition: Improved  Diet recommendation: Regular full salt with added salt diet  Filed Weights   10/29/18 0603 10/29/18 1015 10/30/18 0430  Weight: 57 kg 57 kg 56.5 kg    History of present illness:  73 year old Caucasian female DM TY 2, gastric ulcer 2004 with IBS, cholecystectomy plus biliary dyskinesia, fatty liver 2019, EGD 2019 gastric ulcer ?  Admit 2/2 ischemic colitis 05/05/2018-->CTA 09/2018 =75-85% stenosis proximal celiac trunk-Dr. Fredia Sorrow IR evaluated patient DDX median arcuate ligament syndrome??  Appropriate candidate for angios/stent to celiac access  Recent admit 09/2018 hypovolemic hyponatremia [place]-work-up at the time revealed normal cortisol levels  Continues to have hyponatremia-GI consulted secondary to management of IBS and questionable need for intervention on celiac stenosis-patient claims to have never gotten better after index admission 05/05/2018 and has had persisting diarrhea since then  Hospital Course:  IBS seeiming worse after Prior ?isch colitis 04/2018 with some celiac plaqeu--non steting candidate--DDx median arcuate lig synd Defer  planning including medications on discharge to GI-appreciate input Colonoscopy 7/18 performed was completely normal-->will need outpatient management with Dr. 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                           - No specimens collected. Recommendation:           - Return patient to hospital ward for ongoing care.                           - Soft diet.                           - No repeat colonoscopy due to current age (33  years or older).                           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 Chales AbrahamsGupta after dc If not set up we will                            arrange  it                           take Linzess at least severeal times a week to                            promote defecation                           f/u endocrinology as planned  Consultations:  GI  Discharge Exam: Vitals:   10/30/18 0430 10/30/18 0810  BP: (!) 145/93 119/83  Pulse: 77 76  Resp:    Temp: 97.6 F (36.4 C)   SpO2: 100%     General: Awake alert coherent no distress although a little woozy this morning 8 proper breakfast no nausea passed 1 stool Cardiovascular: S1-S2 no murmur rub gallop Respiratory: Chest clinically clear no added sound Abdomen soft no rebound or guarding No lower extremity edema Neurologically intact   Discharge Instructions   Discharge Instructions    Diet - low sodium heart healthy   Complete by: As directed    Discharge instructions   Complete by: As directed    Patient encouraged to kindly take salt tablets twice a day drink only about 1000 cc to 1200 cc of fluid a  day because of your low salt levels in your blood I would encourage you to you do yard work and outside activity in the summertime only when it is below 80 degrees and take frequent rests I do not want you to feel dizzy or weak Please take the Linzess only do not take other medications for your IBS and follow-up with Dr. Lyndel Safe in the outpatient setting Increase your activity slowly and make sure that you are comfortable   Increase activity slowly   Complete by: As directed      Allergies as of 10/30/2018      Reactions   Dilaudid [hydromorphone] Shortness Of Breath, Nausea And Vomiting, Other (See Comments)   "Depressed my respiratory drive and made me hypotensive"   Fentanyl Shortness Of Breath, Nausea And Vomiting   "Depressed my respiratory drive and made me hypotensive"   Latex Anaphylaxis, Rash, Other (See Comments)   Caused redness also; BLISTERS THE SKIN (a bite block did this)   Morphine And Related Shortness Of Breath, Nausea And Vomiting, Other (See  Comments)   "Depressed my respiratory drive and made me hypotensive"   Aspirin Nausea Only   Irritates the stomach   Codeine Nausea And Vomiting   Nsaids Nausea Only, Other (See Comments)   These irritate the stomach   Other Other (See Comments)   Patient is sensitive to "binders" or "fillers" in meds   Oxycodone Nausea Only, Other (See Comments)   Requires anti-nausea medication to tolerate   Statins Other (See Comments)   "Made my joints hurt"   Verapamil Other (See Comments)   "Threw me into V-Tach"   Adhesive [tape] Rash, Other (See Comments)   Leaves an "imprint" on the skin      Medication List    STOP taking these medications   acetaminophen 500 MG tablet Commonly known as: TYLENOL   lidocaine 5 % Commonly known as: LIDODERM     TAKE these medications   B-12 2500 MCG Tabs Take 2,500 mcg by mouth daily as needed (to boost the immune system). Sublingual   docusate sodium 100 MG capsule Commonly known as: COLACE Take 1 capsule (100 mg total) by mouth daily as needed for mild constipation. What changed: when to take this   linaclotide 72 MCG capsule Commonly known as: Linzess Take 1 capsule (72 mcg total) by mouth daily before breakfast.   loratadine 10 MG tablet Commonly known as: CLARITIN Take 10 mg by mouth daily as needed for allergies or rhinitis.   mirtazapine 7.5 MG tablet Commonly known as: REMERON Take 1 tablet (7.5 mg total) by mouth at bedtime.   ondansetron 8 MG tablet Commonly known as: Zofran Take 1 tablet (8 mg total) by mouth every 8 (eight) hours as needed for nausea or vomiting.   OSTEO BI-FLEX ADV TRIPLE ST PO Take 1 capsule by mouth daily.   pantoprazole 40 MG tablet Commonly known as: PROTONIX Take 1 tablet (40 mg total) by mouth daily.   PEPCID AC PO Take 1 tablet by mouth at bedtime.   polyethylene glycol 17 g packet Commonly known as: MIRALAX / GLYCOLAX Take 17 g by mouth daily as needed. What changed: when to take this    propranolol ER 80 MG 24 hr capsule Commonly known as: INDERAL LA Take 80 mg by mouth daily.   sodium chloride 1 g tablet Take 1 tablet (1 g total) by mouth 2 (two) times daily with a meal.   SYSTANE ULTRA OP Place 1 drop  into both eyes 2 (two) times daily.   vitamin C 500 MG tablet Commonly known as: ASCORBIC ACID Take 500 mg by mouth daily.      Allergies  Allergen Reactions  . Dilaudid [Hydromorphone] Shortness Of Breath, Nausea And Vomiting and Other (See Comments)    "Depressed my respiratory drive and made me hypotensive"  . Fentanyl Shortness Of Breath and Nausea And Vomiting    "Depressed my respiratory drive and made me hypotensive"  . Latex Anaphylaxis, Rash and Other (See Comments)    Caused redness also; BLISTERS THE SKIN (a bite block did this)  . Morphine And Related Shortness Of Breath, Nausea And Vomiting and Other (See Comments)    "Depressed my respiratory drive and made me hypotensive"  . Aspirin Nausea Only    Irritates the stomach  . Codeine Nausea And Vomiting  . Nsaids Nausea Only and Other (See Comments)    These irritate the stomach  . Other Other (See Comments)    Patient is sensitive to "binders" or "fillers" in meds  . Oxycodone Nausea Only and Other (See Comments)    Requires anti-nausea medication to tolerate  . Statins Other (See Comments)    "Made my joints hurt"  . Verapamil Other (See Comments)    "Threw me into V-Tach"  . Adhesive [Tape] Rash and Other (See Comments)    Leaves an "imprint" on the skin      The results of significant diagnostics from this hospitalization (including imaging, microbiology, ancillary and laboratory) are listed below for reference.    Significant Diagnostic Studies: Dg Chest 2 View  Result Date: 10/26/2018 CLINICAL DATA:  73 y/o  F; chest pain. EXAM: CHEST - 2 VIEW COMPARISON:  09/16/2018 chest radiograph FINDINGS: Normal cardiac silhouette. Aortic atherosclerosis with calcification. Stable small linear  opacity at left lung base compatible with scarring or atelectasis. No consolidation. No pleural effusion or pneumothorax. No acute osseous abnormality is evident. Right upper quadrant surgical clips, presumably cholecystectomy. IMPRESSION: No acute pulmonary process identified. Electronically Signed   By: Mitzi HansenLance  Furusawa-Stratton M.D.   On: 10/26/2018 20:56   Ct Abdomen Pelvis W Contrast  Result Date: 10/27/2018 CLINICAL DATA:  73 year old female with abdominal pain, weight loss. Colitis in January. EXAM: CT ABDOMEN AND PELVIS WITH CONTRAST TECHNIQUE: Multidetector CT imaging of the abdomen and pelvis was performed using the standard protocol following bolus administration of intravenous contrast. CONTRAST:  100mL OMNIPAQUE IOHEXOL 300 MG/ML  SOLN COMPARISON:  CTA abdomen and pelvis 09/16/2018, CT Abdomen and Pelvis 05/05/2018. FINDINGS: Lower chest: Linear scarring or atelectasis in the lower lobes is stable. No pericardial or pleural effusion. Hepatobiliary: Surgically absent gallbladder as before. Negative liver. Pancreas: Negative. Spleen: Negative. Adrenals/Urinary Tract: Normal adrenal glands. Stable and negative kidneys. Symmetric renal enhancement and contrast excretion. Normal proximal ureters. Unremarkable urinary bladder. Stomach/Bowel: Redundant distal sigmoid colon with diverticulosis. No definite sigmoid or rectal active inflammation. Mild retained stool upstream. But fairly decompressed large bowel overall. Oral contrast has reached the ascending colon. Negative terminal ileum. Diminutive or absent appendix. The right colon appears mildly indistinct (coronal image 66), but there is no convincing mesenteric stranding. No dilated large bowel. Negative stomach and duodenum. No free air, free fluid. Vascular/Lymphatic: Aortoiliac calcified atherosclerosis. Major arterial structures in the abdomen and pelvis remain patent. Portal venous system appears patent. No lymphadenopathy. Reproductive:  Surgically absent uterus. Diminutive or absent ovaries. Other: No pelvic free fluid. Musculoskeletal: No acute osseous abnormality identified. IMPRESSION: No definite bowel inflammation. No acute or  inflammatory process identified. Electronically Signed   By: Odessa Fleming M.D.   On: 10/27/2018 22:24   Ir Radiologist Eval & Mgmt  Result Date: 10/04/2018 Please refer to notes tab for details about interventional procedure. (Op Note)   Microbiology: Recent Results (from the past 240 hour(s))  SARS Coronavirus 2 (CEPHEID - Performed in [facility] hospital lab), Hosp Order     Status: None   Collection Time: 10/27/18  2:40 AM   Specimen: Nasopharyngeal Swab  Result Value Ref Range Status   SARS Coronavirus 2 NEGATIVE NEGATIVE Final    Comment: (NOTE) If result is NEGATIVE SARS-CoV-2 target nucleic acids are NOT DETECTED. The SARS-CoV-2 RNA is generally detectable in upper and lower  respiratory specimens during the acute phase of infection. The lowest  concentration of SARS-CoV-2 viral copies this assay can detect is 250  copies / mL. A negative result does not preclude SARS-CoV-2 infection  and should not be used as the sole basis for treatment or other  patient management decisions.  A negative result may occur with  improper specimen collection / handling, submission of specimen other  than nasopharyngeal swab, presence of viral mutation(s) within the  areas targeted by this assay, and inadequate number of viral copies  (<250 copies / mL). A negative result must be combined with clinical  observations, patient history, and epidemiological information. If result is POSITIVE SARS-CoV-2 target nucleic acids are DETECTED. The SARS-CoV-2 RNA is generally detectable in upper and lower  respiratory specimens dur ing the acute phase of infection.  Positive  results are indicative of active infection with SARS-CoV-2.  Clinical  correlation with patient history and other diagnostic information is   necessary to determine patient infection status.  Positive results do  not rule out bacterial infection or co-infection with other viruses. If result is PRESUMPTIVE POSTIVE SARS-CoV-2 nucleic acids MAY BE PRESENT.   A presumptive positive result was obtained on the submitted specimen  and confirmed on repeat testing.  While 2019 novel coronavirus  (SARS-CoV-2) nucleic acids may be present in the submitted sample  additional confirmatory testing may be necessary for epidemiological  and / or clinical management purposes  to differentiate between  SARS-CoV-2 and other Sarbecovirus currently known to infect humans.  If clinically indicated additional testing with an alternate test  methodology [phone]) is advised. The SARS-CoV-2 RNA is generally  detectable in upper and lower respiratory sp ecimens during the acute  phase of infection. The expected result is Negative. Fact Sheet for Patients:  BoilerBrush.com.cy Fact Sheet for Healthcare Providers: https://pope.com/ This test is not yet approved or cleared by the [place] FDA and has been authorized for detection and/or diagnosis of SARS-CoV-2 by FDA under an Emergency Use Authorization (EUA).  This EUA will remain in effect (meaning this test can be used) for the duration of the COVID-19 declaration under Section 564(b)(1) of the Act, 21 U.S.C. section 360bbb-3(b)(1), unless the authorization is terminated or revoked sooner. Performed at [facility], [address] [place], [place] [address]      Labs: Basic Metabolic Panel: Recent Labs  Lab 10/26/18 2020 10/27/18 0531 10/28/18 0547 10/29/18 0332 10/30/18 0742  NA 129* 130* 129* 134* 138  K 4.8 4.0 4.1 4.2 4.5  CL 97* 101 101 103 106  CO2 20* 21* 21* 23 23  GLUCOSE 119* 214* 116* 118* 108*  BUN CREATININE 1.37* 1.14* 1.11* 0.99 1.05*  CALCIUM 10.1 8.9 8.9 9.3 9.4  PHOS  --   --   --  2.7 3.3    Liver Function Tests: Recent Labs  Lab 10/27/18 0531 10/29/18 0332 10/30/18 0742  AST 17  --   --   ALT 16  --   --   ALKPHOS 82  --   --   BILITOT 0.3  --   --   PROT 6.6  --   --   ALBUMIN 3.3* 3.4* 3.4*   Recent Labs  Lab 10/27/18 0531  LIPASE 79*   No results for input(s): AMMONIA in the last 168 hours. CBC: Recent Labs  Lab 10/26/18 2020 10/27/18 0531 10/28/18 0547  WBC 7.2 6.0 6.2  NEUTROABS  --   --  3.0  HGB 11.9* 11.3* 11.4*  HCT 35.1* 33.8* 33.6*  MCV 91.6 91.6 89.8  PLT 333 295 294   Cardiac Enzymes: Recent Labs  Lab 10/26/18 2318  CKTOTAL 28*   BNP: BNP (last 3 results) No results for input(s): BNP in the last 8760 hours.  ProBNP (last 3 results) No results for input(s): PROBNP in the last 8760 hours.  CBG: Recent Labs  Lab 10/29/18 0736 10/29/18 1301 10/29/18 1644 10/29/18 2105 10/30/18 0722  GLUCAP 117* 133* 213* 91 94       Signed:  Rhetta MuraJai-Gurmukh Dee Paden MD   [facility] 10/30/2018, 10:06 AM

## 2018-10-30 NOTE — Plan of Care (Signed)
  Problem: Clinical Measurements: Goal: Will remain free from infection Outcome: Progressing   Problem: Activity: Goal: Risk for activity intolerance will decrease Outcome: Progressing   Problem: Nutrition: Goal: Adequate nutrition will be maintained Outcome: Progressing   Problem: Coping: Goal: Level of anxiety will decrease Outcome: Progressing   Problem: Elimination: Goal: Will not experience complications related to bowel motility Outcome: Progressing   Problem: Pain Managment: Goal: General experience of comfort will improve Outcome: Progressing   Problem: Safety: Goal: Ability to remain free from injury will improve Outcome: Progressing   

## 2018-10-30 NOTE — Plan of Care (Signed)

## 2018-10-31 ENCOUNTER — Telehealth: Payer: Self-pay | Admitting: Gastroenterology

## 2018-10-31 NOTE — Telephone Encounter (Signed)
Would you like to switch this to something else?  

## 2018-10-31 NOTE — Telephone Encounter (Signed)
Pt reported that she cannot afford Linzess at $500.

## 2018-11-01 NOTE — Telephone Encounter (Signed)
Patient called would like to know if we have samples or a coupon for the [place].Marland Kitchen She would like to pick up today asap.

## 2018-11-01 NOTE — Telephone Encounter (Signed)
I have called and spoke to the patient, she is going to come to the [facility] office to pick up samples.

## 2018-11-01 NOTE — Telephone Encounter (Signed)
Do we have samples of Linzess If you can give her the same dose as before She is RN Thx RG

## 2018-11-14 ENCOUNTER — Ambulatory Visit (INDEPENDENT_AMBULATORY_CARE_PROVIDER_SITE_OTHER): Payer: Medicare Other | Admitting: Gastroenterology

## 2018-11-14 ENCOUNTER — Encounter: Payer: Self-pay | Admitting: Gastroenterology

## 2018-11-14 ENCOUNTER — Telehealth: Payer: Self-pay

## 2018-11-14 ENCOUNTER — Other Ambulatory Visit: Payer: Self-pay

## 2018-11-14 VITALS — BP 134/92 | HR 87 | Temp 98.4°F | Ht 64.0 in | Wt 130.2 lb

## 2018-11-14 DIAGNOSIS — K581 Irritable bowel syndrome with constipation: Secondary | ICD-10-CM

## 2018-11-14 DIAGNOSIS — K219 Gastro-esophageal reflux disease without esophagitis: Secondary | ICD-10-CM | POA: Diagnosis not present

## 2018-11-14 NOTE — Patient Instructions (Signed)
If you are age 74 or older, your body mass index should be between 23-30. Your Body mass index is 22.36 kg/m. If this is out of the aforementioned range listed, please consider follow up with your Primary Care Provider.  If you are age 50 or younger, your body mass index should be between 19-25. Your Body mass index is 22.36 kg/m. If this is out of the aformentioned range listed, please consider follow up with your Primary Care Provider.   Follow up in November.  Thank you,  Dr. Jackquline Denmark

## 2018-11-14 NOTE — Telephone Encounter (Signed)
Covid-19 screening questions   Do you now or have you had a fever in the last 14 days? no  Do you have any respiratory symptoms of shortness of breath or cough now or in the last 14 days? No  Do you have any family members or close contacts with diagnosed or suspected Covid-19 in the past 14 days? No  Have you been tested for Covid-19 and found to be positive? Been tested 3 times and came back negative each time per patient  Visit was changed to in office

## 2018-11-14 NOTE — Progress Notes (Signed)
Chief Complaint:   Referring Provider:  Leane CallNodal, Jr Reinaldo, PA-C      ASSESSMENT AND PLAN;   #1. 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 Neg colon 10/2018. Neg CTA 09/16/2018.   #2. GERD with HH. EGD 01/2018 superficial GU, Bx neg for HP/celiac.   #3. Postprandial Epigastric/LUQ pain with nausea. (s/p chole for biliary dyskinesia). 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 Not a cause for abdominal pain per radiology and surgery.  #4. IBS with constipation - neg colon 10/29/2018  #5. Recent admission to [facility] due to hypovolemic hyponatremia (Dr Allena KatzPatel).   Plan: - Protonix 40 mg p.o. QD  - Continue salt pills - Continue low-dose Remeron. - Continue Linzess on as-needed basis. - FU in November 2020.  Earlier, if with any new problems. 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 [place] With DM2, PVCs, GERD, chronic abdominal pain and nausea. Nephew died with sepsis, GD - misscarried, sister fell-has been under considerable stress  Eating much better. Has gained 5 pounds since discharge. Continued mild chronic left upper abdominal pain especially after eating.  Has longstanding history of constipation.  However, Linzess would cause her to have diarrhea.  Then she would occasionally take Imodium.  Being followed by Dr. Allena KatzPatel for hyponatremia.  She is currently on salt tablets and fluid restricted diet.  Had negative colonoscopy 10/29/2018.  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  Patent SMA, IMA.  [facility] adm 6/5- 09/20/2018 with Na 121.  Hypovolemic hyponatremia, acute kidney injury due to dehydration, type 2 diabetes   ED visit - 05/05/2018 with acute abdominal pain with abdominal bloating and bloody diarrhea. CT scan showed colitis involving transverse colon, splenic flexure and proximal sigmoid colon. ?  Etiology.  Mesenteric blood vessels were patent.   stool negative for C. Difficile.  Treated with Cipro and Flagyl.   Husband recently had a car wreck-hence patient has not been exercising as she is taking care of him.   Past Medical History:  Diagnosis Date  . Anemia   . Anginal pain (HCC)   . Arthritis   . Bladder infection   . Colitis   . Complication of anesthesia    narrow airway per pt can have a bp drop and hard to wake up  . Diabetes mellitus without complication (HCC)   . Diffuse cystic mastopathy   . Diverticulosis   . Family history of adverse reaction to anesthesia    brother and sister hard to wake up  . GERD (gastroesophageal reflux disease)   . Headache   . Heart murmur   . History of kidney stones   . Hyperlipidemia   . Mitral valve disorder    MVP  . Mitral valve prolapse   . Paroxysmal atrial tachycardia (HCC)   . PONV (postoperative nausea and vomiting)   . PSVT (paroxysmal supraventricular tachycardia) (HCC)   . PVC's (premature ventricular contractions)   . SVT (supraventricular tachycardia) (HCC)   . Varicose veins     Past Surgical History:  Procedure Laterality Date  . ABDOMINAL HYSTERECTOMY    . ANTERIOR AND POSTERIOR VAGINAL REPAIR    . ANTERIOR CERVICAL DECOMP/DISCECTOMY FUSION N/A 08/19/2016   Procedure: ANTERIOR CERVICAL DECOMPRESSION/DISCECTOMY FUSION CERVICAL FOUR- CERVICAL FIVE, CERVICAL FIVE- CERVICAL SIX;  Surgeon: Tressie StalkerJenkins, Jeffrey, MD;  [facility];  Service: Neurosurgery;  Laterality:  N/A;  ANTERIOR CERVICAL DECOMPRESSION/DISCECTOMY FUSION CERVICAL 4- CERVICAL 5, CERVICAL 5- CERVICAL6  . APPENDECTOMY    . BREAST LUMPECTOMY    . CHOLECYSTECTOMY    . COLONOSCOPY WITH PROPOFOL N/A 10/29/2018   Procedure: COLONOSCOPY WITH PROPOFOL;  Surgeon: Iva BoopGessner, Carl E, MD;  [facility];  Service: Endoscopy;  Laterality: N/A;  . ELBOW SURGERY Right   . HERNIA REPAIR    . IR RADIOLOGIST EVAL & MGMT  10/04/2018    Family History  Problem Relation Age of Onset  . Esophageal cancer  Father   . Diverticulitis Sister   . Colon cancer Neg Hx     Social History   Tobacco Use  . Smoking status: Never Smoker  . Smokeless tobacco: Never Used  Substance Use Topics  . Alcohol use: No  . Drug use: No    Current Outpatient Medications  Medication Sig Dispense Refill  . Cyanocobalamin (B-12) 2500 MCG TABS Take 2,500 mcg by mouth daily as needed (to boost the immune system). Sublingual    . Famotidine (PEPCID AC PO) Take 1 tablet by mouth at bedtime.    Marland Kitchen. linaclotide (LINZESS) 72 MCG capsule Take 1 capsule (72 mcg total) by mouth daily before breakfast. 30 capsule 11  . loratadine (CLARITIN) 10 MG tablet Take 10 mg by mouth daily as needed for allergies or rhinitis.     . mirtazapine (REMERON) 7.5 MG tablet Take 1 tablet (7.5 mg total) by mouth at bedtime. 30 tablet 3  . Misc Natural Products (OSTEO BI-FLEX ADV TRIPLE ST PO) Take 1 capsule by mouth daily.     . ondansetron (ZOFRAN) 8 MG tablet Take 1 tablet (8 mg total) by mouth every 8 (eight) hours as needed for nausea or vomiting. 20 tablet 0  . pantoprazole (PROTONIX) 40 MG tablet Take 1 tablet (40 mg total) by mouth daily. 30 tablet 11  . Polyethyl Glycol-Propyl Glycol (SYSTANE ULTRA OP) Place 1 drop into both eyes 2 (two) times daily.    . propranolol ER (INDERAL LA) 80 MG 24 hr capsule Take 80 mg by mouth daily.    . sodium chloride 1 g tablet Take 1 tablet (1 g total) by mouth 2 (two) times daily with a meal. 60 tablet 3  . vitamin C (ASCORBIC ACID) 500 MG tablet Take 500 mg by mouth daily.     No current facility-administered medications for this visit.     Allergies  Allergen Reactions  . Dilaudid [Hydromorphone] Shortness Of Breath, Nausea And Vomiting and Other (See Comments)    "Depressed my respiratory drive and made me hypotensive"  . Fentanyl Shortness Of Breath and Nausea And Vomiting    "Depressed my respiratory drive and made me hypotensive"  . Latex Anaphylaxis, Rash and Other (See Comments)     Caused redness also; BLISTERS THE SKIN (a bite block did this)  . Morphine And Related Shortness Of Breath, Nausea And Vomiting and Other (See Comments)    "Depressed my respiratory drive and made me hypotensive"  . Aspirin Nausea Only    Irritates the stomach  . Codeine Nausea And Vomiting  . Nsaids Nausea Only and Other (See Comments)    These irritate the stomach  . Other Other (See Comments)    Patient is sensitive to "binders" or "fillers" in meds  . Oxycodone Nausea Only and Other (See Comments)    Requires anti-nausea medication to tolerate  . Statins Other (See Comments)    "Made my joints hurt"  .  Verapamil Other (See Comments)    "Threw me into V-Tach"  . Adhesive [Tape] Rash and Other (See Comments)    Leaves an "imprint" on the skin    Review of Systems:  neg     Physical Exam:    BP (!) 134/92   Pulse 87   Temp 98.4 F (36.9 C)   Ht 5\' 4"  (1.626 m)   Wt 130 lb 4 oz (59.1 kg)   BMI 22.36 kg/m  Filed Weights   11/14/18 1610  Weight: 130 lb 4 oz (59.1 kg)   televisit .  Data Reviewed: I have personally reviewed following labs and imaging studies  CBC: CBC Latest Ref Rng & Units 10/28/2018 10/27/2018 10/26/2018  WBC 4.0 - 10.5 K/uL 6.2 6.0 7.2  Hemoglobin 12.0 - 15.0 g/dL 11.4(L) 11.3(L) 11.9(L)  Hematocrit 36.0 - 46.0 % 33.6(L) 33.8(L) 35.1(L)  Platelets 150 - 400 K/uL 294 295 333    CMP: CMP Latest Ref Rng & Units 10/30/2018 10/29/2018 10/28/2018  Glucose 70 - 99 mg/dL 108(H) 118(H) 116(H)  BUN 8 - 23 mg/dL 11 11 12   Creatinine 0.44 - 1.00 mg/dL 1.05(H) 0.99 1.11(H)  Sodium 135 - 145 mmol/L 138 134(L) 129(L)  Potassium 3.5 - 5.1 mmol/L 4.5 4.2 4.1  Chloride 98 - 111 mmol/L 106 103 101  CO2 22 - 32 mmol/L 23 23 21(L)  Calcium 8.9 - 10.3 mg/dL 9.4 9.3 8.9  Total Protein 6.5 - 8.1 g/dL - - -  Total Bilirubin 0.3 - 1.2 mg/dL - - -  Alkaline Phos 38 - 126 U/L - - -  AST 15 - 41 U/L - - -  ALT 0 - 44 U/L - - -    GFR: Estimated Creatinine  Clearance: 40.6 mL/min (A) (by C-G formula based on SCr of 1.05 mg/dL (H)). Liver Function Tests: No results for input(s): AST, ALT, ALKPHOS, BILITOT, PROT, ALBUMIN in the last 168 hours.   No results found for this or any previous visit (from the past 240 hour(s)).    Radiology Studies: Dg Chest 2 View  Result Date: 10/26/2018 CLINICAL DATA:  73 y/o  F; chest pain. EXAM: CHEST - 2 VIEW COMPARISON:  09/16/2018 chest radiograph FINDINGS: Normal cardiac silhouette. Aortic atherosclerosis with calcification. Stable small linear opacity at left lung base compatible with scarring or atelectasis. No consolidation. No pleural effusion or pneumothorax. No acute osseous abnormality is evident. Right upper quadrant surgical clips, presumably cholecystectomy. IMPRESSION: No acute pulmonary process identified. Electronically Signed   By: Kristine Garbe M.D.   On: 10/26/2018 20:56   Ct Abdomen Pelvis W Contrast  Result Date: 10/27/2018 CLINICAL DATA:  73 year old female with abdominal pain, weight loss. Colitis in January. EXAM: CT ABDOMEN AND PELVIS WITH CONTRAST TECHNIQUE: Multidetector CT imaging of the abdomen and pelvis was performed using the standard protocol following bolus administration of intravenous contrast. CONTRAST:  159mL OMNIPAQUE IOHEXOL 300 MG/ML  SOLN COMPARISON:  CTA abdomen and pelvis 09/16/2018, CT Abdomen and Pelvis 05/05/2018. FINDINGS: Lower chest: Linear scarring or atelectasis in the lower lobes is stable. No pericardial or pleural effusion. Hepatobiliary: Surgically absent gallbladder as before. Negative liver. Pancreas: Negative. Spleen: Negative. Adrenals/Urinary Tract: Normal adrenal glands. Stable and negative kidneys. Symmetric renal enhancement and contrast excretion. Normal proximal ureters. Unremarkable urinary bladder. Stomach/Bowel: Redundant distal sigmoid colon with diverticulosis. No definite sigmoid or rectal active inflammation. Mild retained stool upstream.  But fairly decompressed large bowel overall. Oral contrast has reached the ascending colon. Negative terminal ileum. Diminutive  or absent appendix. The right colon appears mildly indistinct (coronal image 66), but there is no convincing mesenteric stranding. No dilated large bowel. Negative stomach and duodenum. No free air, free fluid. Vascular/Lymphatic: Aortoiliac calcified atherosclerosis. Major arterial structures in the abdomen and pelvis remain patent. Portal venous system appears patent. No lymphadenopathy. Reproductive: Surgically absent uterus. Diminutive or absent ovaries. Other: No pelvic free fluid. Musculoskeletal: No acute osseous abnormality identified. IMPRESSION: No definite bowel inflammation. No acute or inflammatory process identified. Electronically Signed   By: Odessa FlemingH  Hall M.D.   On: 10/27/2018 22:24        Edman Circleaj Ulus Hazen, MD 11/14/2018, 4:41 PM  Cc: Leane CallNodal, Jr Reinaldo, PA-C

## 2018-11-14 NOTE — Telephone Encounter (Signed)
Covid-19 screening questions   Do you now or have you had a fever in the last 14 days? No  Do you have any respiratory symptoms of shortness of breath or cough now or in the last 14 days? No  Do you have any family members or close contacts with diagnosed or suspected Covid-19 in the past 14 days? No  Have you been tested for Covid-19 and found to be positive? No        

## 2018-11-29 ENCOUNTER — Telehealth: Payer: Self-pay | Admitting: Gastroenterology

## 2018-11-29 NOTE — Telephone Encounter (Signed)
Please advise 

## 2018-11-29 NOTE — Telephone Encounter (Signed)
Pt stated that Dr. Carlean Purl had prescribed Remeron when pt was hospitalised.  Pt reported that it is causing insomnia and raising her BP.

## 2018-11-29 NOTE — Telephone Encounter (Signed)
Lets cut dose in half and see how she does. Take it in the morning Record blood pressure in the morning for 2-3 days and then in the evening for 2 to 3 days. 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  RG

## 2018-11-30 NOTE — Telephone Encounter (Signed)
I have called patient and left message for her to return my call.  

## 2018-12-02 NOTE — Telephone Encounter (Signed)
I have called patient and left a message for her to return my call.   

## 2018-12-07 NOTE — Telephone Encounter (Signed)
I have called patient and left message for patient to return call.

## 2019-02-27 DIAGNOSIS — S60459A Superficial foreign body of unspecified finger, initial encounter: Secondary | ICD-10-CM

## 2019-02-27 HISTORY — DX: Superficial foreign body of unspecified finger, initial encounter: S60.459A

## 2019-03-23 DIAGNOSIS — Z0000 Encounter for general adult medical examination without abnormal findings: Secondary | ICD-10-CM

## 2019-03-23 HISTORY — DX: Encounter for general adult medical examination without abnormal findings: Z00.00

## 2019-06-29 ENCOUNTER — Other Ambulatory Visit: Payer: Self-pay

## 2019-06-29 ENCOUNTER — Encounter: Payer: Self-pay | Admitting: Gastroenterology

## 2019-06-29 ENCOUNTER — Ambulatory Visit (INDEPENDENT_AMBULATORY_CARE_PROVIDER_SITE_OTHER): Payer: Medicare Other | Admitting: Gastroenterology

## 2019-06-29 VITALS — BP 140/90 | HR 94 | Temp 97.1°F | Ht 65.0 in | Wt 136.0 lb

## 2019-06-29 DIAGNOSIS — K581 Irritable bowel syndrome with constipation: Secondary | ICD-10-CM

## 2019-06-29 DIAGNOSIS — R1013 Epigastric pain: Secondary | ICD-10-CM

## 2019-06-29 DIAGNOSIS — K449 Diaphragmatic hernia without obstruction or gangrene: Secondary | ICD-10-CM | POA: Diagnosis not present

## 2019-06-29 NOTE — Patient Instructions (Signed)
If you are age 75 or older, your body mass index should be between 23-30. Your Body mass index is 22.63 kg/m. If this is out of the aforementioned range listed, please consider follow up with your Primary Care Provider.  If you are age 19 or younger, your body mass index should be between 19-25. Your Body mass index is 22.63 kg/m. If this is out of the aformentioned range listed, please consider follow up with your Primary Care Provider.   Decrease salt tablet to once daily.   Have your BMP rechecked in 2 weeks at Dr. Markus Daft office.   Follow up in 3 months.   Thank you,  Dr. Lynann Bologna

## 2019-06-29 NOTE — Progress Notes (Signed)
Chief Complaint:   Referring Provider:  Leane Call, PA-C      ASSESSMENT AND PLAN;   #1. 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 Neg colon 10/2018. Neg CTA 09/16/2018, CT AP 10/2018.   #2. GERD with HH. EGD 01/2018 superficial GU, Bx neg for HP/celiac.   #3. Postprandial Epigastric pain (resolved) with nausea (attributed to sodium tablets). (s/p chole for biliary dyskinesia). 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 Not a cause for abdominal pain per radiology and surgery.  #4. IBS with constipation (resolved) - neg colon 10/29/2018  #5. Recent adm to [facility] d/t hypovolemic hyponatremia.  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   Plan: - Continue Protonix 40 mg p.o. QD  - Can decrease salt tabs to 1/day. Recheck BMP in 2 weeks at Dr Samuel Germany office. - Continue stool softner. - FU in 3 months. 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 Nephew died with sepsis, GD - misscarried, sister fell-has been under considerable stress.  Having problems with sodium tablets - makes her very sick to stomach. Last Na was 136. Wants to cut down to once a day.   Eating much better. Has gained weight as below.   Has longstanding history of constipation. Much better with stool softner and MiraLAX as needed.  Had 2 bowel movements today.  Stopped Linzess.   Being followed by Dr. Allena Katz for hyponatremia.  She is currently on salt tablets and fluid restricted diet. Also, started on metformin.  Had negative colonoscopy 10/29/2018.  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  Patent SMA, IMA.  [facility] adm 6/5- 09/20/2018 with Na 121.  Hypovolemic hyponatremia, acute kidney injury due to dehydration, type 2 diabetes   ED visit - 05/05/2018 with acute abdominal pain with abdominal bloating and bloody diarrhea.  CT scan showed colitis involving transverse colon, splenic flexure and proximal sigmoid colon. ?  Etiology.  Mesenteric blood vessels were patent.  stool negative for C. Difficile.  Treated with Cipro and Flagyl.   Husband recently had a car wreck-hence patient has not been exercising as she is taking care of him.  Wt Readings from Last 3 Encounters:  06/29/19 136 lb (61.7 kg)  11/14/18 130 lb 4 oz (59.1 kg)  10/30/18 124 lb 9.6 oz (56.5 kg)   CT AP 10/2018: -No acute abnormalities.  Past Medical History:  Diagnosis Date  . Anemia   . Anginal pain (HCC)   . Arthritis   . Bladder infection   . Colitis   . Complication of anesthesia    narrow airway per pt can have a bp drop and hard to wake up  . Diabetes mellitus without complication (HCC)   . Diffuse cystic mastopathy   . Diverticulosis   . Family history of adverse reaction to anesthesia    brother and sister hard to wake up  . GERD (gastroesophageal reflux disease)   . Headache   . Heart murmur   . History of kidney stones   . Hyperlipidemia   . Mitral valve disorder    MVP  . Mitral valve prolapse   . Paroxysmal atrial tachycardia (HCC)   . PONV (postoperative nausea and vomiting)   . PSVT (paroxysmal supraventricular tachycardia) (HCC)   . PVC's (premature ventricular contractions)   .  SVT (supraventricular tachycardia) (HCC)   . Varicose veins     Past Surgical History:  Procedure Laterality Date  . ABDOMINAL HYSTERECTOMY    . ANTERIOR AND POSTERIOR VAGINAL REPAIR    . ANTERIOR CERVICAL DECOMP/DISCECTOMY FUSION N/A 08/19/2016   Procedure: ANTERIOR CERVICAL DECOMPRESSION/DISCECTOMY FUSION CERVICAL FOUR- CERVICAL FIVE, CERVICAL FIVE- CERVICAL SIX;  Surgeon: Tressie Stalker, MD;  Location: [facility] OR;  Service: Neurosurgery;  Laterality: N/A;  ANTERIOR CERVICAL DECOMPRESSION/DISCECTOMY FUSION CERVICAL 4- CERVICAL 5, CERVICAL 5- CERVICAL6  . APPENDECTOMY    . BREAST LUMPECTOMY    . CHOLECYSTECTOMY    . COLONOSCOPY WITH  PROPOFOL N/A 10/29/2018   Procedure: COLONOSCOPY WITH PROPOFOL;  Surgeon: Iva Boop, MD;  Location: [facility] ENDOSCOPY;  Service: Endoscopy;  Laterality: N/A;  . ELBOW SURGERY Right   . HERNIA REPAIR    . IR RADIOLOGIST EVAL & MGMT  10/04/2018    Family History  Problem Relation Age of Onset  . Esophageal cancer Father   . Diverticulitis Sister   . Colon cancer Neg Hx     Social History   Tobacco Use  . Smoking status: Never Smoker  . Smokeless tobacco: Never Used  Substance Use Topics  . Alcohol use: No  . Drug use: No    Current Outpatient Medications  Medication Sig Dispense Refill  . CALCIUM-MAGNESIUM-ZINC PO Take 1 tablet by mouth daily.    . Cyanocobalamin (B-12) 2500 MCG TABS Take 2,500 mcg by mouth daily as needed (to boost the immune system). Sublingual    . Docusate Calcium (STOOL SOFTENER PO) Take 1 tablet by mouth as needed.    . Famotidine (PEPCID AC PO) Take 1 tablet by mouth at bedtime.    Marland Kitchen loratadine (CLARITIN) 10 MG tablet Take 10 mg by mouth daily as needed for allergies or rhinitis.     . Misc Natural Products (OSTEO BI-FLEX ADV TRIPLE ST PO) Take 1 capsule by mouth daily.     . ondansetron (ZOFRAN) 8 MG tablet Take 1 tablet (8 mg total) by mouth every 8 (eight) hours as needed for nausea or vomiting. 20 tablet 0  . pantoprazole (PROTONIX) 40 MG tablet Take 1 tablet (40 mg total) by mouth daily. 30 tablet 11  . Polyethyl Glycol-Propyl Glycol (SYSTANE ULTRA OP) Place 1 drop into both eyes 2 (two) times daily.    . Polyethylene Glycol 3350 (MIRALAX PO) Take by mouth as needed.    . propranolol ER (INDERAL LA) 80 MG 24 hr capsule Take 80 mg by mouth daily.    . sodium chloride 1 g tablet Take 1 tablet (1 g total) by mouth 2 (two) times daily with a meal. 60 tablet 3  . vitamin C (ASCORBIC ACID) 500 MG tablet Take 500 mg by mouth daily.     No current facility-administered medications for this visit.    Allergies  Allergen Reactions  . Dilaudid  [Hydromorphone] Shortness Of Breath, Nausea And Vomiting and Other (See Comments)    "Depressed my respiratory drive and made me hypotensive"  . Fentanyl Shortness Of Breath and Nausea And Vomiting    "Depressed my respiratory drive and made me hypotensive"  . Latex Anaphylaxis, Rash and Other (See Comments)    Caused redness also; BLISTERS THE SKIN (a bite block did this)  . Morphine And Related Shortness Of Breath, Nausea And Vomiting and Other (See Comments)    "Depressed my respiratory drive and made me hypotensive"  . Aspirin Nausea Only    Irritates the stomach  .  Codeine Nausea And Vomiting  . Nsaids Nausea Only and Other (See Comments)    These irritate the stomach  . Other Other (See Comments)    Patient is sensitive to "binders" or "fillers" in meds  . Oxycodone Nausea Only and Other (See Comments)    Requires anti-nausea medication to tolerate  . Statins Other (See Comments)    "Made my joints hurt"  . Verapamil Other (See Comments)    "Threw me into V-Tach"  . Adhesive [Tape] Rash and Other (See Comments)    Leaves an "imprint" on the skin    Review of Systems:  neg     Physical Exam:    BP 140/90   Pulse 94   Temp (!) 97.1 F (36.2 C)   Ht 5\' 5"  (1.651 m)   Wt 136 lb (61.7 kg)   BMI 22.63 kg/m  Filed Weights   06/29/19 0829  Weight: 136 lb (61.7 kg)   televisit .  Data Reviewed: I have personally reviewed following labs and imaging studies  CBC: CBC Latest Ref Rng & Units 10/28/2018 10/27/2018 10/26/2018  WBC 4.0 - 10.5 K/uL 6.2 6.0 7.2  Hemoglobin 12.0 - 15.0 g/dL 11.4(L) 11.3(L) 11.9(L)  Hematocrit 36.0 - 46.0 % 33.6(L) 33.8(L) 35.1(L)  Platelets 150 - 400 K/uL 294 295 333    CMP: CMP Latest Ref Rng & Units 10/30/2018 10/29/2018 10/28/2018  Glucose 70 - 99 mg/dL 108(H) 118(H) 116(H)  BUN 8 - 23 mg/dL 11 11 12   Creatinine 0.44 - 1.00 mg/dL 1.05(H) 0.99 1.11(H)  Sodium 135 - 145 mmol/L 138 134(L) 129(L)  Potassium 3.5 - 5.1 mmol/L 4.5 4.2 4.1    Chloride 98 - 111 mmol/L 106 103 101  CO2 22 - 32 mmol/L 23 23 21(L)  Calcium 8.9 - 10.3 mg/dL 9.4 9.3 8.9  Total Protein 6.5 - 8.1 g/dL - - -  Total Bilirubin 0.3 - 1.2 mg/dL - - -  Alkaline Phos 38 - 126 U/L - - -  AST 15 - 41 U/L - - -  ALT 0 - 44 U/L - - -       Carmell Austria, MD 06/29/2019, 8:40 AM  Cc: Maggie Schwalbe, PA-C

## 2019-07-07 ENCOUNTER — Other Ambulatory Visit: Payer: Self-pay | Admitting: *Deleted

## 2019-07-07 DIAGNOSIS — M79604 Pain in right leg: Secondary | ICD-10-CM

## 2019-07-10 ENCOUNTER — Ambulatory Visit (INDEPENDENT_AMBULATORY_CARE_PROVIDER_SITE_OTHER): Payer: Medicare Other | Admitting: Physician Assistant

## 2019-07-10 ENCOUNTER — Ambulatory Visit (HOSPITAL_COMMUNITY)
Admission: RE | Admit: 2019-07-10 | Discharge: 2019-07-10 | Disposition: A | Discharge status: Home / Self Care | Payer: Medicare Other | Source: Ambulatory Visit | Attending: Surgery | Admitting: Surgery

## 2019-07-10 ENCOUNTER — Other Ambulatory Visit: Payer: Self-pay

## 2019-07-10 VITALS — BP 136/87 | HR 74 | Temp 97.3°F | Resp 20 | Ht 65.0 in | Wt 134.0 lb

## 2019-07-10 DIAGNOSIS — M79604 Pain in right leg: Secondary | ICD-10-CM | POA: Insufficient documentation

## 2019-07-10 DIAGNOSIS — I83893 Varicose veins of bilateral lower extremities with other complications: Secondary | ICD-10-CM | POA: Diagnosis not present

## 2019-07-10 DIAGNOSIS — M79605 Pain in left leg: Secondary | ICD-10-CM | POA: Insufficient documentation

## 2019-07-10 DIAGNOSIS — I781 Nevus, non-neoplastic: Secondary | ICD-10-CM | POA: Diagnosis not present

## 2019-07-10 NOTE — Progress Notes (Signed)
Requested by:  Maggie Schwalbe, PA-C [place],  [place] [id number]  Reason for consultation: spider veins    History of Present Illness   Jasmine Farrell is a 74 y.o. (March 11, 1946) female who presents for evaluation of spider veins.  Venous symptoms include: aching, heavy, tired legs BLE Onset/duration:  Spider veins since 2014  Occupation:  Retired Therapist, sports Aggravating factors: standing for extended periods of time Alleviating factors: elevation of BLE Compression: worn only when exercising  Previous vein procedures:  Sclerotherapy of BLE for spider veins History of DVT:  None  Past Medical History:  Diagnosis Date  . Anemia   . Anginal pain ([facility])   . Arthritis   . Bladder infection   . Colitis   . Complication of anesthesia    narrow airway per pt can have a bp drop and hard to wake up  . Diabetes mellitus without complication ([facility])   . Diffuse cystic mastopathy   . Diverticulosis   . Family history of adverse reaction to anesthesia    brother and sister hard to wake up  . GERD (gastroesophageal reflux disease)   . Headache   . Heart murmur   . History of kidney stones   . Hyperlipidemia   . Mitral valve disorder    MVP  . Mitral valve prolapse   . Paroxysmal atrial tachycardia ([facility])   . PONV (postoperative nausea and vomiting)   . PSVT (paroxysmal supraventricular tachycardia) ([facility])   . PVC's (premature ventricular contractions)   . SVT (supraventricular tachycardia) ([facility])   . Varicose veins     Past Surgical History:  Procedure Laterality Date  . ABDOMINAL HYSTERECTOMY    . ANTERIOR AND POSTERIOR VAGINAL REPAIR    . ANTERIOR CERVICAL DECOMP/DISCECTOMY FUSION N/A 08/19/2016   Procedure: ANTERIOR CERVICAL DECOMPRESSION/DISCECTOMY FUSION CERVICAL FOUR- CERVICAL FIVE, CERVICAL FIVE- CERVICAL SIX;  Surgeon: Newman Pies, MD;  Location: [facility];  Service: Neurosurgery;  Laterality: N/A;  ANTERIOR CERVICAL DECOMPRESSION/DISCECTOMY FUSION  CERVICAL 4- CERVICAL 5, CERVICAL 5- CERVICAL6  . APPENDECTOMY    . BREAST LUMPECTOMY    . CHOLECYSTECTOMY    . COLONOSCOPY WITH PROPOFOL N/A 10/29/2018   Procedure: COLONOSCOPY WITH PROPOFOL;  Surgeon: Gatha Mayer, MD;  Location: [facility] ENDOSCOPY;  Service: Endoscopy;  Laterality: N/A;  . ELBOW SURGERY Right   . HERNIA REPAIR    . IR RADIOLOGIST EVAL & MGMT  10/04/2018    Social History   Socioeconomic History  . Marital status: Married    Spouse name: Not on file  . Number of children: 2  . Years of education: Not on file  . Highest education level: Not on file  Occupational History  . Occupation: retired    Comment: Therapist, sports  Tobacco Use  . Smoking status: Never Smoker  . Smokeless tobacco: Never Used  Substance and Sexual Activity  . Alcohol use: No  . Drug use: No  . Sexual activity: Not on file  Other Topics Concern  . Not on file  Social History Narrative   Retired Therapist, sports   Married 2 grown children   No EtOH/tobacco/drugs   Social Determinants of Radio broadcast assistant Strain:   . Difficulty of Paying Living Expenses:   Food Insecurity:   . Worried About Charity fundraiser in the Last Year:   . Arboriculturist in the Last Year:   Transportation Needs:   . Film/video editor (Medical):   Marland Kitchen  Lack of Transportation (Non-Medical):   Physical Activity:   . Days of Exercise per Week:   . Minutes of Exercise per Session:   Stress:   . Feeling of Stress :   Social Connections:   . Frequency of Communication with Friends and Family:   . Frequency of Social Gatherings with Friends and Family:   . Attends Religious Services:   . Active Member of Clubs or Organizations:   . Attends Banker Meetings:   Marland Kitchen Marital Status:   Intimate Partner Violence:   . Fear of Current or Ex-Partner:   . Emotionally Abused:   Marland Kitchen Physically Abused:   . Sexually Abused:     Family History  Problem Relation Age of Onset  . Esophageal cancer Father   . Diverticulitis  Sister   . Colon cancer Neg Hx     Current Outpatient Medications  Medication Sig Dispense Refill  . CALCIUM-MAGNESIUM-ZINC PO Take 1 tablet by mouth daily.    . Cyanocobalamin (B-12) 2500 MCG TABS Take 2,500 mcg by mouth daily as needed (to boost the immune system). Sublingual    . Docusate Calcium (STOOL SOFTENER PO) Take 1 tablet by mouth as needed.    . Famotidine (PEPCID AC PO) Take 1 tablet by mouth at bedtime.    Marland Kitchen loratadine (CLARITIN) 10 MG tablet Take 10 mg by mouth daily as needed for allergies or rhinitis.     . metFORMIN (GLUCOPHAGE) 500 MG tablet Take 500 mg by mouth 2 (two) times daily.    . Misc Natural Products (OSTEO BI-FLEX ADV TRIPLE ST PO) Take 1 capsule by mouth daily.     . ondansetron (ZOFRAN) 8 MG tablet Take 1 tablet (8 mg total) by mouth every 8 (eight) hours as needed for nausea or vomiting. 20 tablet 0  . pantoprazole (PROTONIX) 40 MG tablet Take 1 tablet (40 mg total) by mouth daily. 30 tablet 11  . Polyethyl Glycol-Propyl Glycol (SYSTANE ULTRA OP) Place 1 drop into both eyes 2 (two) times daily.    . Polyethylene Glycol 3350 (MIRALAX PO) Take by mouth as needed.    . propranolol ER (INDERAL LA) 80 MG 24 hr capsule Take 80 mg by mouth daily.    . sodium chloride 1 g tablet Take 1 tablet (1 g total) by mouth 2 (two) times daily with a meal. 60 tablet 3  . vitamin C (ASCORBIC ACID) 500 MG tablet Take 500 mg by mouth daily.     No current facility-administered medications for this visit.    Allergies  Allergen Reactions  . Dilaudid [Hydromorphone] Shortness Of Breath, Nausea And Vomiting and Other (See Comments)    "Depressed my respiratory drive and made me hypotensive"  . Fentanyl Shortness Of Breath and Nausea And Vomiting    "Depressed my respiratory drive and made me hypotensive"  . Latex Anaphylaxis, Rash and Other (See Comments)    Caused redness also; BLISTERS THE SKIN (a bite block did this)  . Morphine And Related Shortness Of Breath, Nausea And  Vomiting and Other (See Comments)    "Depressed my respiratory drive and made me hypotensive"  . Aspirin Nausea Only    Irritates the stomach  . Codeine Nausea And Vomiting  . Nsaids Nausea Only and Other (See Comments)    These irritate the stomach  . Other Other (See Comments)    Patient is sensitive to "binders" or "fillers" in meds  . Oxycodone Nausea Only and Other (See Comments)  Requires anti-nausea medication to tolerate  . Statins Other (See Comments)    "Made my joints hurt"  . Verapamil Other (See Comments)    "Threw me into V-Tach"  . Adhesive [Tape] Rash and Other (See Comments)    Leaves an "imprint" on the skin    REVIEW OF SYSTEMS (negative unless checked):   Cardiac:  []  Chest pain or chest pressure? []  Shortness of breath upon activity? []  Shortness of breath when lying flat? []  Irregular heart rhythm?  Vascular:  []  Pain in calf, thigh, or hip brought on by walking? []  Pain in feet at night that wakes you up from your sleep? []  Blood clot in your veins? []  Leg swelling?  Pulmonary:  []  Oxygen at home? []  Productive cough? []  Wheezing?  Neurologic:  []  Sudden weakness in arms or legs? []  Sudden numbness in arms or legs? []  Sudden onset of difficult speaking or slurred speech? []  Temporary loss of vision in one eye? []  Problems with dizziness?  Gastrointestinal:  []  Blood in stool? []  Vomited blood?  Genitourinary:  []  Burning when urinating? []  Blood in urine?  Psychiatric:  []  Major depression  Hematologic:  []  Bleeding problems? []  Problems with blood clotting?  Dermatologic:  []  Rashes or ulcers?  Constitutional:  []  Fever or chills?  Ear/Nose/Throat:  []  Change in hearing? []  Nose bleeds? []  Sore throat?  Musculoskeletal:  []  Back pain? []  Joint pain? []  Muscle pain?   Physical Examination     Vitals:   07/10/19 1343  BP: 136/87  Pulse: 74  Resp: 20  Temp: (!) 97.3 F (36.3 C)  SpO2: 98%  Weight: 134 lb  (60.8 kg)  Height: 5\' 5"  (1.651 m)   Body mass index is 22.3 kg/m.  General:  WDWN in NAD; vital signs documented above Gait: Not observed HENT: WNL, normocephalic Pulmonary: normal non-labored breathing , without Rales, rhonchi,  wheezing Cardiac: regular HR  Abdomen: soft, NT, no masses Skin: without rashes Vascular Exam/Pulses:  Right Left  Radial 2+ (normal) 2+ (normal)  DP 2+ (normal) 2+ (normal)   Extremities: spider veins mainly R lateral mid thigh and L lateral ankle Musculoskeletal: no muscle wasting or atrophy  Neurologic: A&O X 3;  No focal weakness or paresthesias are detected Psychiatric:  The pt has Normal affect.  Non-invasive Vascular Imaging   BLE Venous Insufficiency Duplex :   RLE:   Negative for DVT and SVT,   GSV reflux at mid thigh and proximal calf only,  Negative for SSV reflux,  Negative for deep venous reflux   LLE:  Negative for DVT and SVT,   Negative for GSV reflux,   Negative for SSV reflux,  Negative for deep venous reflux   Medical Decision Making   MARGARETT VITI is a 74 y.o. female who presents with spider veins of BLE   Based on the reflux study, she is negative for deep and superficial venous insufficiency  Duplex also negative for DVT  Recommended continued compression and elevation BLE  Patient was also evaluated for sclerotherapy by ; patient will notify the office of her decision at a later date  Follow up prn   , PA-C [facility] Office: [phone]  07/10/2019, 2:12 PM  Clinic MD: 

## 2019-08-02 ENCOUNTER — Telehealth: Payer: Self-pay | Admitting: Gastroenterology

## 2019-08-02 DIAGNOSIS — E871 Hypo-osmolality and hyponatremia: Secondary | ICD-10-CM

## 2019-08-02 NOTE — Telephone Encounter (Signed)
Pt stated that PCP Reinaldo Nodal requires orders for pt to have labs q 3 months.  Please fax to [phone], attention April, CMA.

## 2019-08-07 NOTE — Telephone Encounter (Signed)
I have called patient and left message for her to return my call.  

## 2019-08-09 NOTE — Telephone Encounter (Signed)
Patient said that you had told her that you wanted her BMP checked every 3 months at her PCPs office. I do not see this in your note. Would you like me to send an order ffor this to be done?

## 2019-08-12 NOTE — Telephone Encounter (Signed)
Lets get it checked once RG

## 2019-08-14 ENCOUNTER — Other Ambulatory Visit: Payer: Self-pay

## 2019-08-14 NOTE — Telephone Encounter (Signed)
I have faxed an order to provided fax number to have this lab drawn.

## 2019-08-17 IMAGING — US US ABDOMEN COMPLETE
1 series · 14 of 25 positions shown · non-contrast
Comparison: CT abdomen pelvis of 04/03/2011

CLINICAL DATA: Epigastric pain and bloating

EXAM:
ABDOMEN ULTRASOUND COMPLETE

[Series 1: us abdomen complete · 0.20mm/px · 14 of 106 slices shown]
[im 1/106]
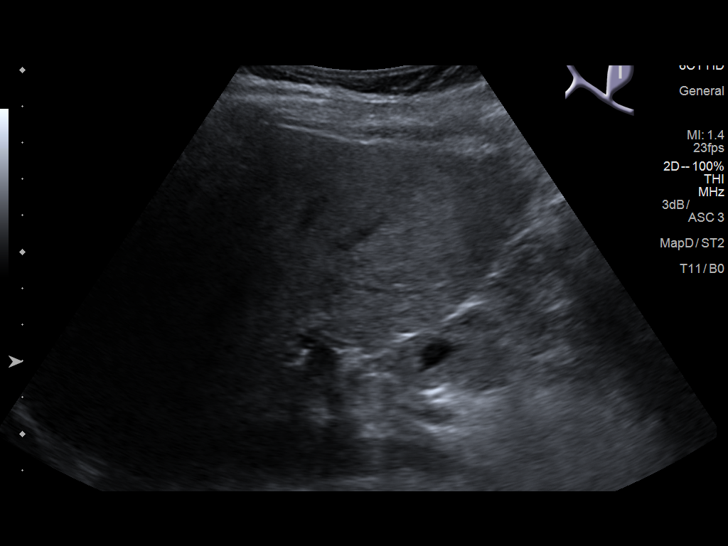
[im 9/106]
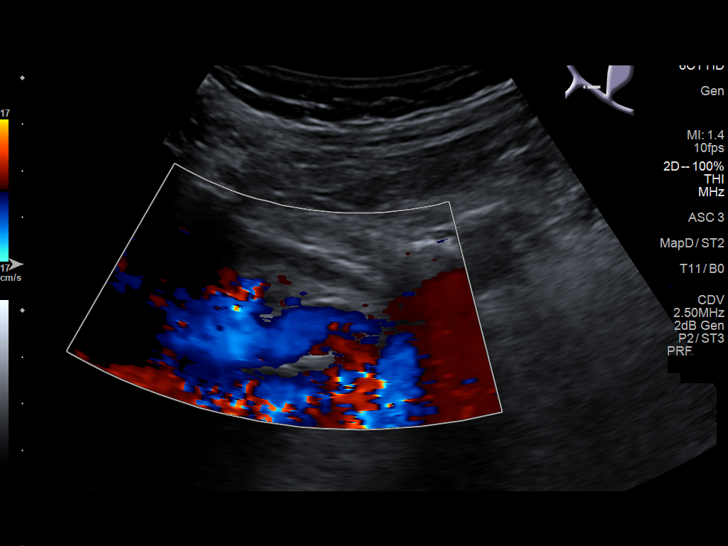
[im 18/106]
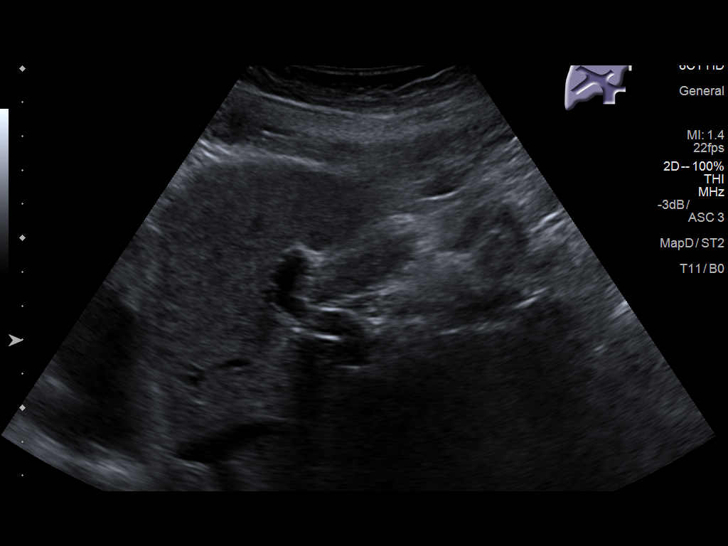
[im 27/106]
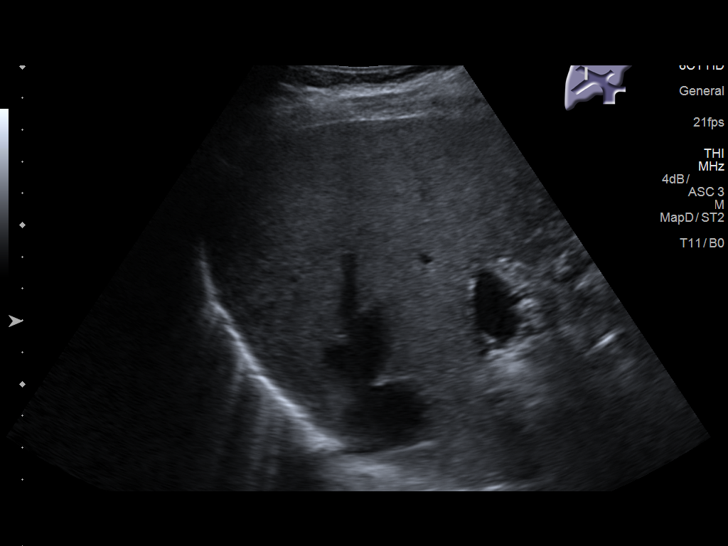
[im 36/106]
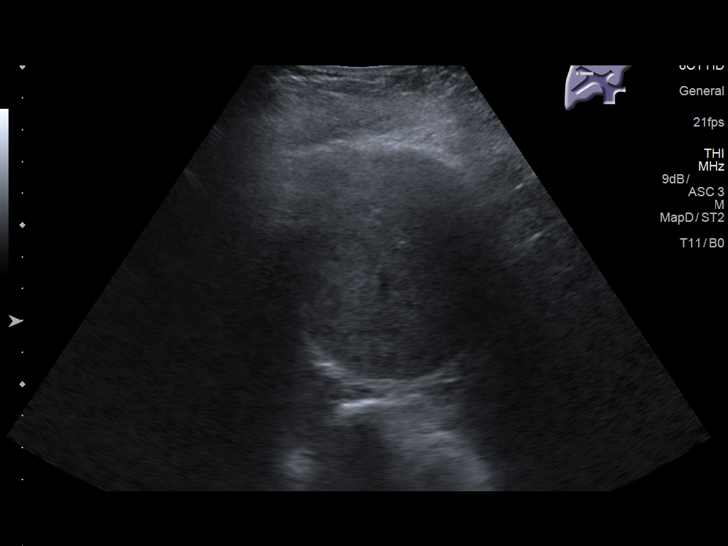
[im 40/106]
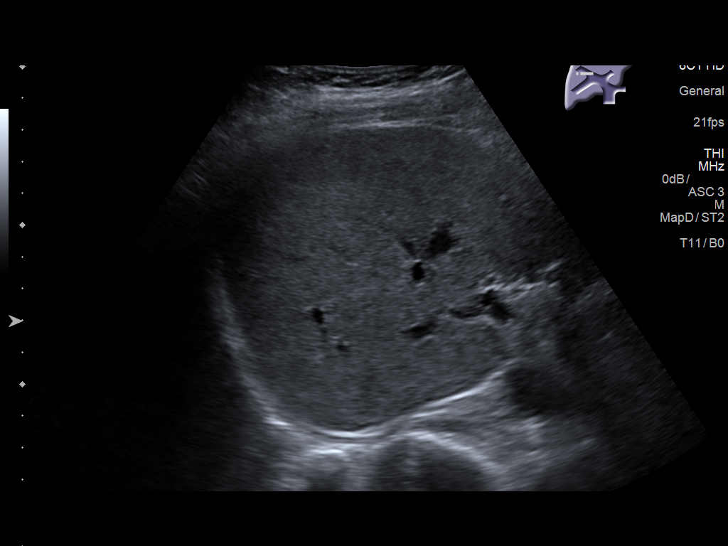
[im 49/106]
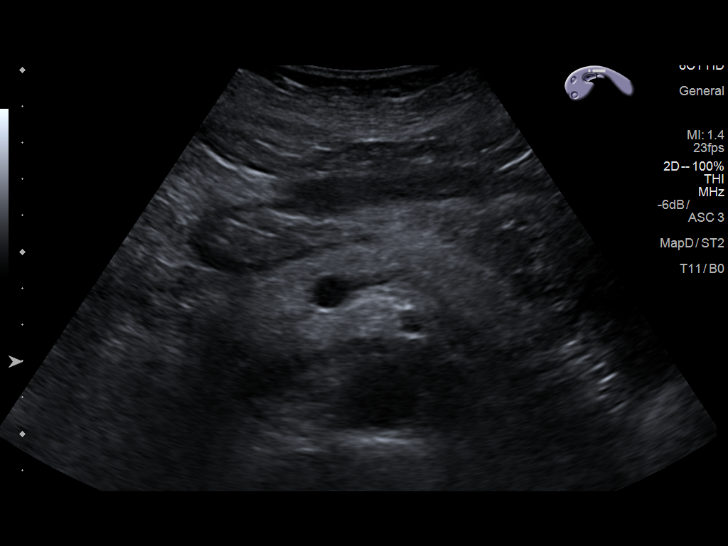
[im 57/106]
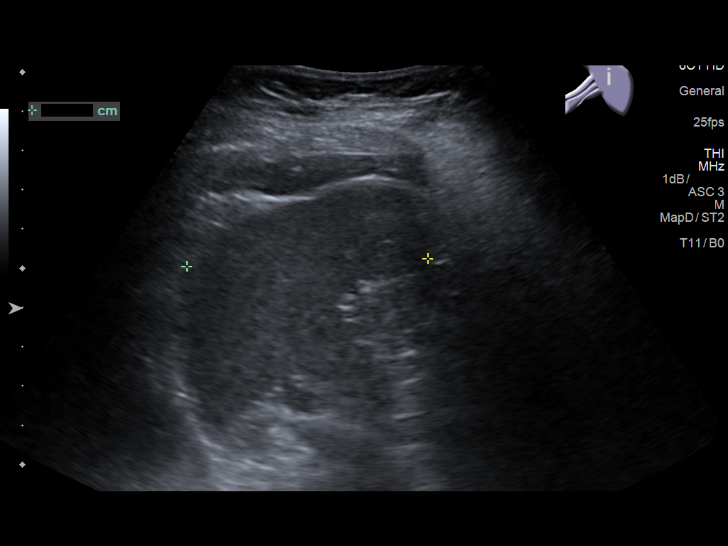
[im 66/106]
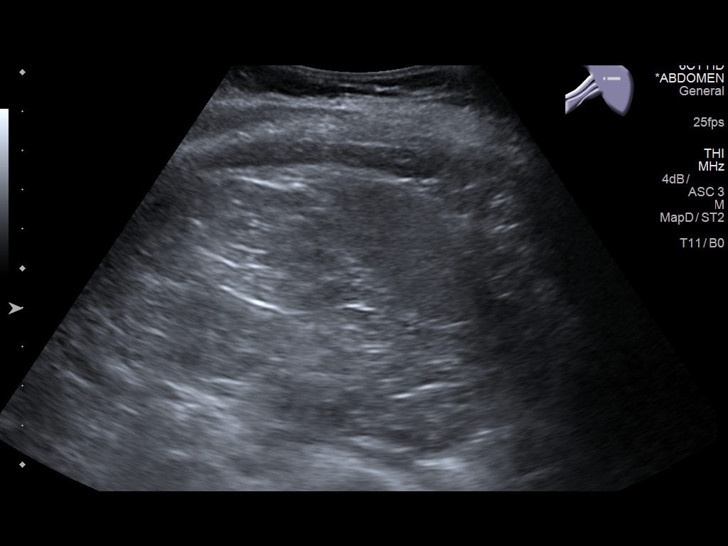
[im 71/106]
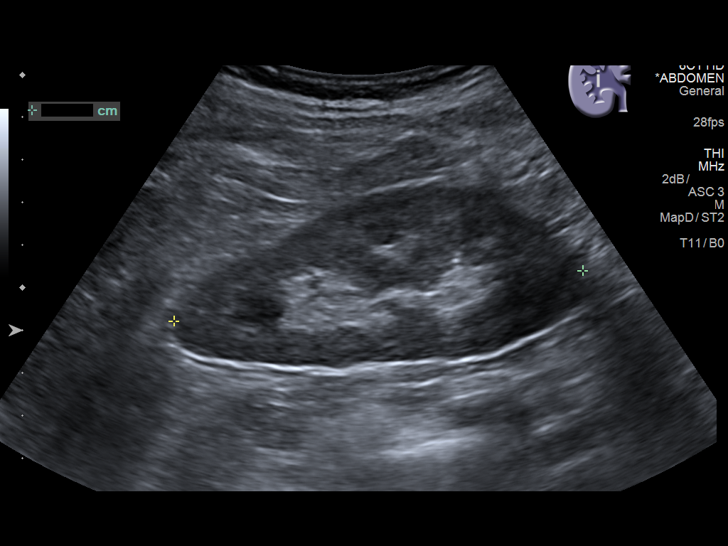
[im 79/106]
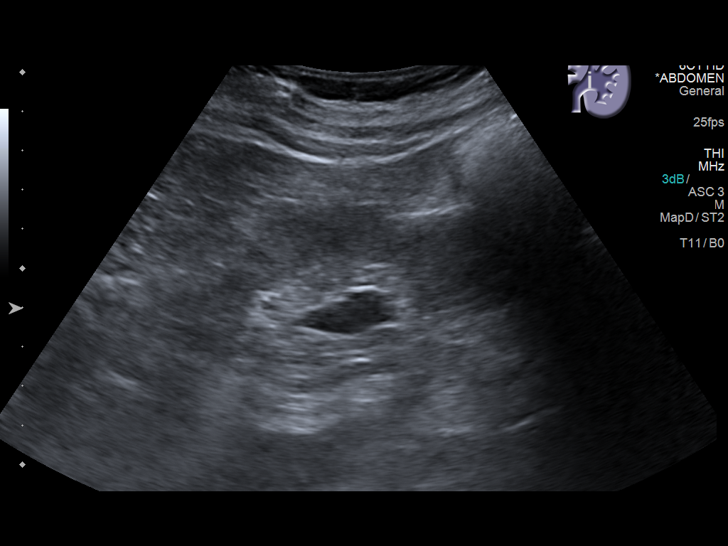
[im 88/106]
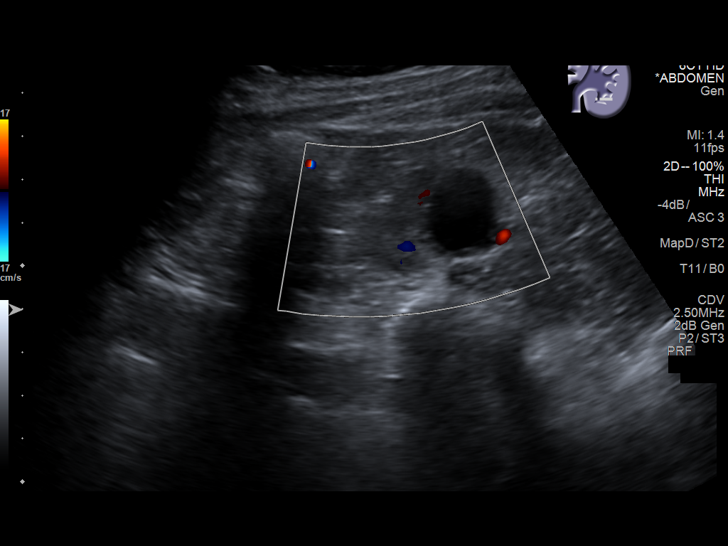
[im 97/106]
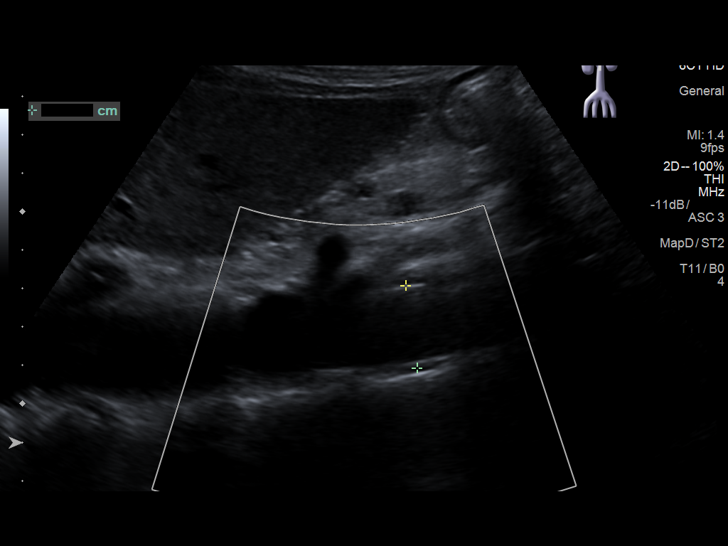
[im 106/106]
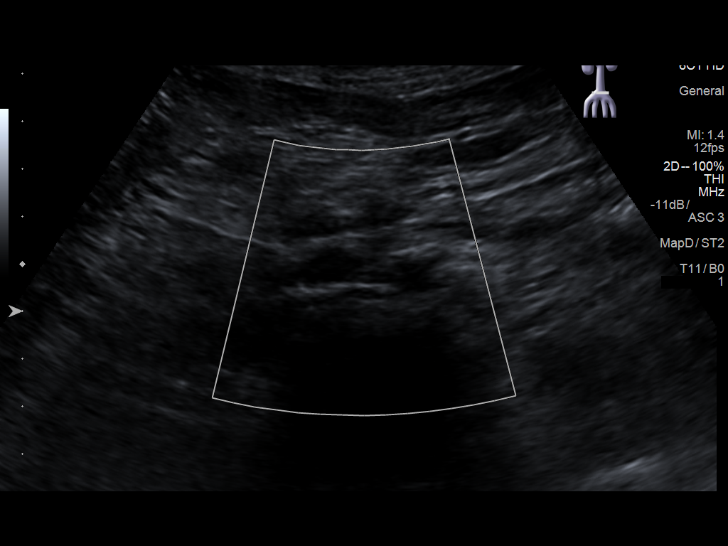

[14 of 25 positions shown; findings below may reference images not displayed]

FINDINGS: Gallbladder: Gallbladder has previously been resected.

Common bile duct: Diameter: The common bile duct is normal measuring
4.3 mm in diameter.

Liver: The parenchyma of the liver is echogenic and inhomogeneous,
consistent with hepatic steatosis. No focal hepatic abnormality is
seen. Portal vein is patent on color Doppler imaging with normal
direction of blood flow towards the liver.

IVC: No abnormality visualized.

Pancreas: The pancreas is moderately well seen with no abnormality
noted.

Spleen: The spleen measures 6.1 cm.

Right Kidney: Length: 9.7 cm..  No hydronephrosis is seen.

Left Kidney: Length: 10.2 cm.. There is a small cyst of 2.0 cm
present. No hydronephrosis is seen.

Abdominal aorta: The abdominal aorta is normal in caliber with mild
atherosclerotic change.

Other findings: None.
IMPRESSION: 1. Somewhat echogenic hepatic parenchyma consistent with hepatic
steatosis. No focal hepatic abnormality.
2. The pancreas is unremarkable.
3. No hydronephrosis is seen.

## 2019-10-23 ENCOUNTER — Encounter: Payer: Self-pay | Admitting: Gastroenterology

## 2019-10-23 ENCOUNTER — Ambulatory Visit (INDEPENDENT_AMBULATORY_CARE_PROVIDER_SITE_OTHER): Payer: Medicare Other | Admitting: Gastroenterology

## 2019-10-23 VITALS — BP 144/80 | HR 68 | Ht 65.0 in | Wt 132.4 lb

## 2019-10-23 DIAGNOSIS — K219 Gastro-esophageal reflux disease without esophagitis: Secondary | ICD-10-CM | POA: Diagnosis not present

## 2019-10-23 DIAGNOSIS — K581 Irritable bowel syndrome with constipation: Secondary | ICD-10-CM

## 2019-10-23 DIAGNOSIS — R1013 Epigastric pain: Secondary | ICD-10-CM | POA: Diagnosis not present

## 2019-10-23 MED ORDER — PANTOPRAZOLE SODIUM 40 MG PO TBEC: 40.0000 mg | DELAYED_RELEASE_TABLET | Freq: Every day | ORAL | 11 refills | Status: DC

## 2019-10-23 MED ORDER — SODIUM CHLORIDE 1 G PO TABS: 1.0000 g | ORAL_TABLET | Freq: Every day | ORAL | 6 refills | Status: AC

## 2019-10-23 NOTE — Patient Instructions (Addendum)
If you are age 74 or older, your body mass index should be between 23-30. Your Body mass index is 22.03 kg/m. If this is out of the aforementioned range listed, please consider follow up with your Primary Care Provider.  If you are age 71 or younger, your body mass index should be between 19-25. Your Body mass index is 22.03 kg/m. If this is out of the aformentioned range listed, please consider follow up with your Primary Care Provider.   Continue Protonix 40 mg daily.  Can decrease salt tabs to 1 daily.  Your provider has requested that you go to the basement level for lab work at [address] [place], [place]. Press "B" on the elevator. The lab is located at the first door on the left as you exit the elevator.  Continue stool softener.  Follow up with me in 6 months.  Please call the office for an appointment as the schedule is not available at this time.  Thank you for choosing me and [facility].   Lynann Bologna, MD

## 2019-10-23 NOTE — Progress Notes (Signed)
Chief Complaint:   Referring Provider:  Leane Call, PA-C      ASSESSMENT AND PLAN;   #1. IBS-C with chronic LUQ pain. Neg colon 10/2018. Neg CTA 09/16/2018, CT AP 10/2018.   #2. GERD with HH. EGD 01/2018 superficial GU, Bx neg for HP/celiac.   #3. Postprandial Epigastric pain (resolved) with nausea (attributed to sodium tablets). (s/p chole for biliary dyskinesia). 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 Not a cause for abdominal pain per radiology and surgery.  #4. 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  #5. Fatigue   Plan: - Continue Protonix 40 mg p.o. QD  - Can decrease salt tabs to 1/day.  - CMP, CBC, B12/folate, TSH - Continue stool softner. - FU in 6 months. 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 Nephew died with sepsis, GD - misscarried before, now pregnant (10lb), getting LSCS, sister fell-has been under considerable stress.  Eating much better.  Has gained weight as below.   Has longstanding history of constipation. Much better with stool softner and MiraLAX as needed.  Had 2 bowel movements today.  Stopped Linzess.   Being followed by Dr. Allena Katz for hyponatremia.  She is currently on salt tablets and fluid restricted diet. Also, started on metformin.  Had neg colonoscopy 10/29/2018.  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  Patent SMA, IMA.  MC adm 6/5- 09/20/2018 with Na 121.  Hypovolemic hyponatremia, acute kidney injury due to dehydration, type 2 diabetes   ED visit - 05/05/2018 with acute abdominal pain with abdominal bloating and bloody diarrhea. CT scan showed colitis involving transverse colon, splenic flexure and proximal sigmoid colon. ?  Etiology.  Mesenteric blood vessels were patent.  stool negative for C. Difficile.  Treated with Cipro and Flagyl.     Husband recently had a car wreck-hence patient has not been exercising as she is taking care of him.  Wt Readings from Last 3 Encounters:  10/23/19 132 lb 6 oz (60 kg)  07/10/19 134 lb (60.8 kg)  06/29/19 136 lb (61.7 kg)   CT AP 10/2018: -No acute abnormalities.  Past Medical History:  Diagnosis Date  . Anemia   . Anginal pain ([facility])   . Arthritis   . Bladder infection   . Colitis   . Complication of anesthesia    narrow airway per pt can have a bp drop and hard to wake up  . Diabetes mellitus without complication ([facility])   . Diffuse cystic mastopathy   . Diverticulosis   . Family history of adverse reaction to anesthesia    brother and sister hard to wake up  . GERD (gastroesophageal reflux disease)   . Headache   . Heart murmur   . History of kidney stones   . Hyperlipidemia   . Mitral valve disorder    MVP  . Mitral valve prolapse   . Paroxysmal atrial tachycardia ([facility])   . PONV (postoperative nausea and vomiting)   . PSVT (paroxysmal supraventricular tachycardia) ([facility])   . PVC's (premature ventricular contractions)   . SVT (supraventricular tachycardia) ([facility])   . Varicose veins     Past Surgical History:  Procedure Laterality Date  . ABDOMINAL HYSTERECTOMY    . ANTERIOR AND POSTERIOR VAGINAL REPAIR    . ANTERIOR  CERVICAL DECOMP/DISCECTOMY FUSION N/A 08/19/2016   Procedure: ANTERIOR CERVICAL DECOMPRESSION/DISCECTOMY FUSION CERVICAL FOUR- CERVICAL FIVE, CERVICAL FIVE- CERVICAL SIX;  Surgeon: Tressie Stalker, MD;  Location: [facility] OR;  Service: Neurosurgery;  Laterality: N/A;  ANTERIOR CERVICAL DECOMPRESSION/DISCECTOMY FUSION CERVICAL 4- CERVICAL 5, CERVICAL 5- CERVICAL6  . APPENDECTOMY    . BREAST LUMPECTOMY    . CHOLECYSTECTOMY    . COLONOSCOPY WITH PROPOFOL N/A 10/29/2018   Procedure: COLONOSCOPY WITH PROPOFOL;  Surgeon: Iva Boop, MD;  Location: [facility] ENDOSCOPY;  Service: Endoscopy;  Laterality: N/A;  . ELBOW SURGERY Right   . HERNIA REPAIR    . IR RADIOLOGIST  EVAL & MGMT  10/04/2018    Family History  Problem Relation Age of Onset  . Esophageal cancer Father   . Diverticulitis Sister   . Colon cancer Neg Hx     Social History   Tobacco Use  . Smoking status: Never Smoker  . Smokeless tobacco: Never Used  Vaping Use  . Vaping Use: Never used  Substance Use Topics  . Alcohol use: No  . Drug use: No    Current Outpatient Medications  Medication Sig Dispense Refill  . CALCIUM-MAGNESIUM-ZINC PO Take 1 tablet by mouth daily.    . Cyanocobalamin (B-12) 2500 MCG TABS Take 2,500 mcg by mouth daily as needed (to boost the immune system). Sublingual    . Docusate Calcium (STOOL SOFTENER PO) Take 1 tablet by mouth as needed.    . Famotidine (PEPCID AC PO) Take 1 tablet by mouth at bedtime.    Marland Kitchen loratadine (CLARITIN) 10 MG tablet Take 10 mg by mouth daily as needed for allergies or rhinitis.     . metFORMIN (GLUCOPHAGE) 500 MG tablet Take 500 mg by mouth 2 (two) times daily.    . Misc Natural Products (OSTEO BI-FLEX ADV TRIPLE ST PO) Take 1 capsule by mouth daily.     . ondansetron (ZOFRAN) 8 MG tablet Take 1 tablet (8 mg total) by mouth every 8 (eight) hours as needed for nausea or vomiting. 20 tablet 0  . pantoprazole (PROTONIX) 40 MG tablet Take 1 tablet (40 mg total) by mouth daily. 30 tablet 11  . Polyethyl Glycol-Propyl Glycol (SYSTANE ULTRA OP) Place 1 drop into both eyes 2 (two) times daily.    . Polyethylene Glycol 3350 (MIRALAX PO) Take by mouth as needed.    . propranolol ER (INDERAL LA) 80 MG 24 hr capsule Take 80 mg by mouth daily.    . sodium chloride 1 g tablet Take 1 tablet (1 g total) by mouth 2 (two) times daily with a meal. (Patient taking differently: Take 1 g by mouth daily. ) 60 tablet 3  . vitamin C (ASCORBIC ACID) 500 MG tablet Take 500 mg by mouth daily.     No current facility-administered medications for this visit.    Allergies  Allergen Reactions  . Dilaudid [Hydromorphone] Shortness Of Breath, Nausea And  Vomiting and Other (See Comments)    "Depressed my respiratory drive and made me hypotensive"  . Fentanyl Shortness Of Breath and Nausea And Vomiting    "Depressed my respiratory drive and made me hypotensive"  . Latex Anaphylaxis, Rash and Other (See Comments)    Caused redness also; BLISTERS THE SKIN (a bite block did this)  . Morphine And Related Shortness Of Breath, Nausea And Vomiting and Other (See Comments)    "Depressed my respiratory drive and made me hypotensive"  . Aspirin Nausea Only    Irritates the stomach  .  Codeine Nausea And Vomiting  . Nsaids Nausea Only and Other (See Comments)    These irritate the stomach  . Other Other (See Comments)    Patient is sensitive to "binders" or "fillers" in meds  . Oxycodone Nausea Only and Other (See Comments)    Requires anti-nausea medication to tolerate  . Statins Other (See Comments)    "Made my joints hurt"  . Verapamil Other (See Comments)    "Threw me into V-Tach"  . Adhesive [Tape] Rash and Other (See Comments)    Leaves an "imprint" on the skin    Review of Systems:  neg     Physical Exam:    BP (!) 144/80   Pulse 68   Ht 5\' 5"  (1.651 m)   Wt 132 lb 6 oz (60 kg)   BMI 22.03 kg/m  Filed Weights   10/23/19 1407  Weight: 132 lb 6 oz (60 kg)  Gen: awake, alert, NAD HEENT: anicteric, no pallor CV: RRR, no mrg Pulm: CTA b/l Abd: soft, NT/ND, +BS throughout Ext: no c/c/e Neuro: nonfocal  .  Data Reviewed: I have personally reviewed following labs and imaging studies  CBC: CBC Latest Ref Rng & Units 10/28/2018 10/27/2018 10/26/2018  WBC 4.0 - 10.5 K/uL 6.2 6.0 7.2  Hemoglobin 12.0 - 15.0 g/dL 11.4(L) 11.3(L) 11.9(L)  Hematocrit 36 - 46 % 33.6(L) 33.8(L) 35.1(L)  Platelets 150 - 400 K/uL 294 295 333    CMP: CMP Latest Ref Rng & Units 10/30/2018 10/29/2018 10/28/2018  Glucose 70 - 99 mg/dL 10/30/2018) 093(O) 671(I)  BUN 8 - 23 mg/dL 11 11 12   Creatinine 0.44 - 1.00 mg/dL 458(K) 9.98(P)  Sodium 135 - 145  mmol/L 138 134(L) 129(L)  Potassium 3.5 - 5.1 mmol/L 4.5 4.2 4.1  Chloride 98 - 111 mmol/L 106 103 101  CO2 22 - 32 mmol/L 23 23 21(L)  Calcium 8.9 - 10.3 mg/dL 9.4 9.3 8.9  Total Protein 6.5 - 8.1 g/dL - - -  Total Bilirubin 0.3 - 1.2 mg/dL - - -  Alkaline Phos 38 - 126 U/L - - -  AST 15 - 41 U/L - - -  ALT 0 - 44 U/L - - -       3.82, MD 10/23/2019, 2:24 PM  Cc: Edman Circle, PA-C

## 2019-10-24 ENCOUNTER — Other Ambulatory Visit (INDEPENDENT_AMBULATORY_CARE_PROVIDER_SITE_OTHER): Payer: Medicare Other

## 2019-10-24 DIAGNOSIS — R1013 Epigastric pain: Secondary | ICD-10-CM | POA: Diagnosis not present

## 2019-10-24 DIAGNOSIS — K219 Gastro-esophageal reflux disease without esophagitis: Secondary | ICD-10-CM

## 2019-10-24 DIAGNOSIS — K581 Irritable bowel syndrome with constipation: Secondary | ICD-10-CM | POA: Diagnosis not present

## 2019-10-24 LAB — CBC WITH DIFFERENTIAL/PLATELET
Basophils Absolute: 0.1 10*3/uL (ref 0.0–0.1)
Basophils Relative: 1.8 % (ref 0.0–3.0)
Eosinophils Absolute: 0.5 10*3/uL (ref 0.0–0.7)
Eosinophils Relative: 8.8 % — ABNORMAL HIGH (ref 0.0–5.0)
HCT: 37.4 % (ref 36.0–46.0)
Hemoglobin: 12.8 g/dL (ref 12.0–15.0)
Lymphocytes Relative: 28.5 % (ref 12.0–46.0)
Lymphs Abs: 1.7 10*3/uL (ref 0.7–4.0)
MCHC: 34.1 g/dL (ref 30.0–36.0)
MCV: 90.2 fl (ref 78.0–100.0)
Monocytes Absolute: 0.7 10*3/uL (ref 0.1–1.0)
Monocytes Relative: 11.7 % (ref 3.0–12.0)
Neutro Abs: 2.9 10*3/uL (ref 1.4–7.7)
Neutrophils Relative %: 49.2 % (ref 43.0–77.0)
Platelets: 229 10*3/uL (ref 150.0–400.0)
RBC: 4.14 Mil/uL (ref 3.87–5.11)
RDW: 13.3 % (ref 11.5–15.5)
WBC: 6 10*3/uL (ref 4.0–10.5)

## 2019-10-24 LAB — VITAMIN B12: Vitamin B-12: 554 pg/mL (ref 211–911)

## 2019-10-24 LAB — COMPREHENSIVE METABOLIC PANEL
ALT: 13 U/L (ref 0–35)
AST: 16 U/L (ref 0–37)
Albumin: 4.4 g/dL (ref 3.5–5.2)
Alkaline Phosphatase: 74 U/L (ref 39–117)
BUN: 17 mg/dL (ref 6–23)
CO2: 27 mEq/L (ref 19–32)
Calcium: 9.8 mg/dL (ref 8.4–10.5)
Chloride: 102 mEq/L (ref 96–112)
Creatinine, Ser: 1.03 mg/dL (ref 0.40–1.20)
GFR: 52.23 mL/min — ABNORMAL LOW (ref >60.00)
Glucose, Bld: 151 mg/dL — ABNORMAL HIGH (ref 70–99)
Potassium: 4.5 mEq/L (ref 3.5–5.1)
Sodium: 137 mEq/L (ref 135–145)
Total Bilirubin: 0.6 mg/dL (ref 0.2–1.2)
Total Protein: 7.7 g/dL (ref 6.0–8.3)

## 2019-10-24 LAB — TSH: TSH: 4.81 u[IU]/mL — ABNORMAL HIGH (ref 0.35–4.50)

## 2019-10-24 LAB — FOLATE: Folate: 16.8 ng/mL (ref >5.9)

## 2019-11-29 DIAGNOSIS — R7989 Other specified abnormal findings of blood chemistry: Secondary | ICD-10-CM | POA: Insufficient documentation

## 2019-11-29 HISTORY — DX: Other specified abnormal findings of blood chemistry: R79.89

## 2020-03-05 ENCOUNTER — Telehealth: Payer: Self-pay | Admitting: Gastroenterology

## 2020-03-05 MED ORDER — PANTOPRAZOLE SODIUM 40 MG PO TBEC: 40.0000 mg | DELAYED_RELEASE_TABLET | Freq: Every day | ORAL | 11 refills | Status: DC

## 2020-03-05 NOTE — Telephone Encounter (Signed)
Pt is requesting a refill on her pantoprazole.  [facility]

## 2020-03-05 NOTE — Telephone Encounter (Signed)
I have sent refills to patients pharmacy.  

## 2020-10-30 DIAGNOSIS — R300 Dysuria: Secondary | ICD-10-CM | POA: Insufficient documentation

## 2020-10-30 DIAGNOSIS — R1031 Right lower quadrant pain: Secondary | ICD-10-CM | POA: Insufficient documentation

## 2020-10-30 HISTORY — DX: Dysuria: R30.0

## 2020-10-30 HISTORY — DX: Right lower quadrant pain: R10.31

## 2020-11-12 IMAGING — CT CT ABDOMEN AND PELVIS WITH CONTRAST
2 of 6 series · 16 of 46 positions shown, 18 images · IV contrast (Omni 300)
Comparison: CTA abdomen and pelvis 09/16/2018, CT Abdomen and
Pelvis 05/05/2018.

CLINICAL DATA: 73-year-old female with abdominal pain, weight loss.
Colitis in [REDACTED].

EXAM:
CT ABDOMEN AND PELVIS WITH CONTRAST
TECHNIQUE: Multidetector CT imaging of the abdomen and pelvis was performed
using the standard protocol following bolus administration of
intravenous contrast.
CONTRAST:  100mL OMNIPAQUE IOHEXOL 300 MG/ML  SOLN

[Series 4: thins · axial · 0.79mm/px · z∈[+807,+1242]mm · 13 of 980 slices shown, 15 images]
[im 73/980  soft-tissue]
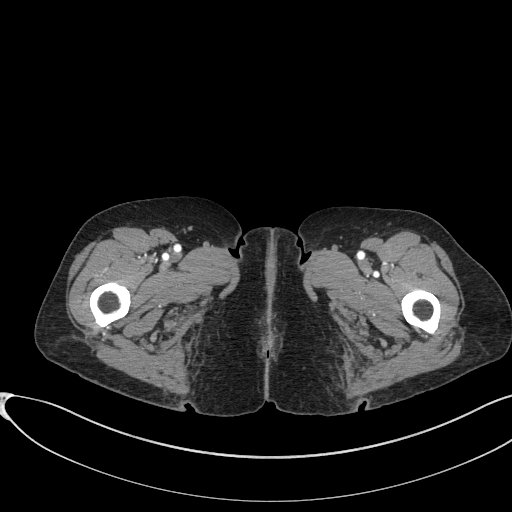
[im 73/980  bone]
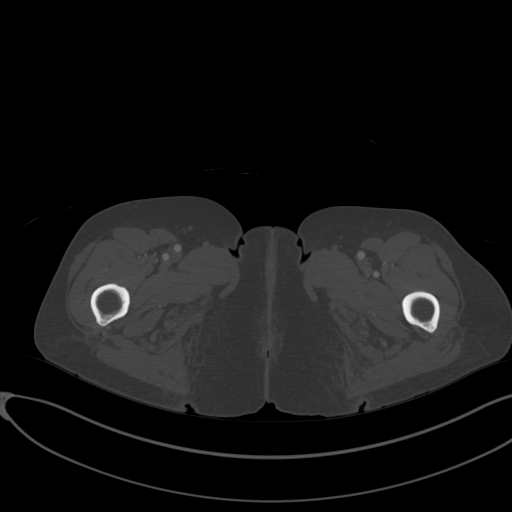
[im 146/980  soft-tissue]
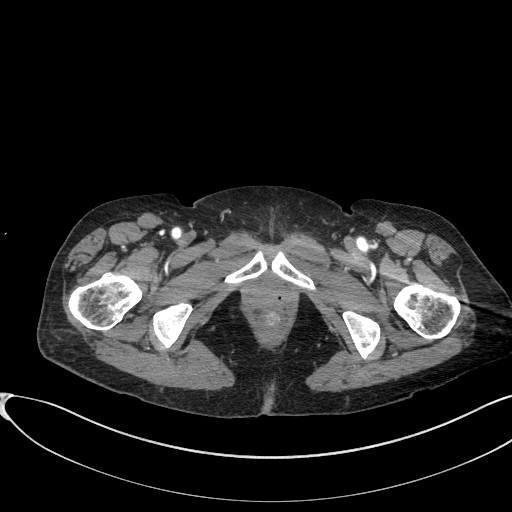
[im 218/980  soft-tissue]
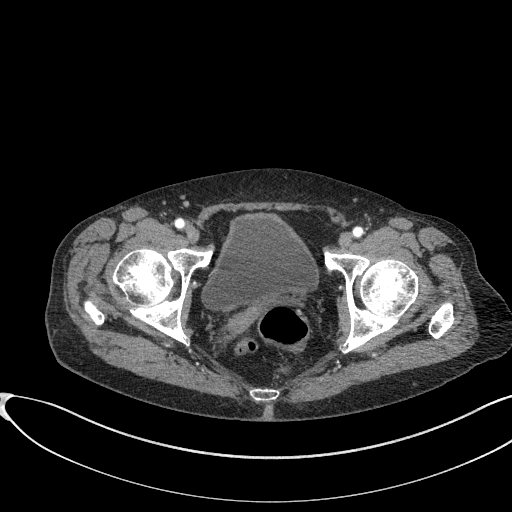
[im 291/980  soft-tissue]
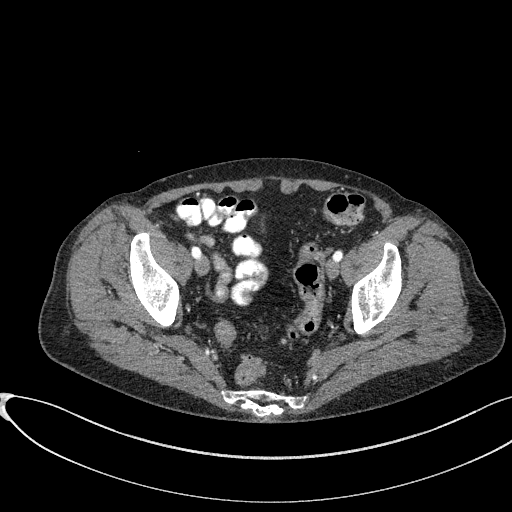
[im 363/980  soft-tissue]
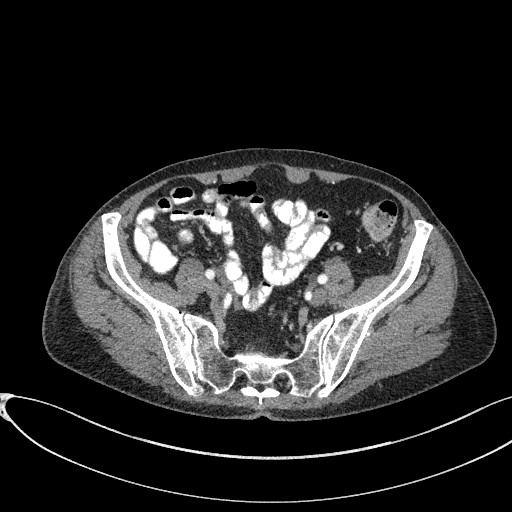
[im 436/980  soft-tissue]
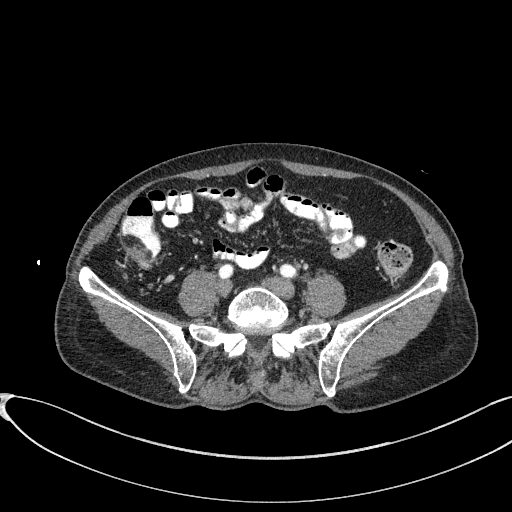
[im 508/980  soft-tissue]
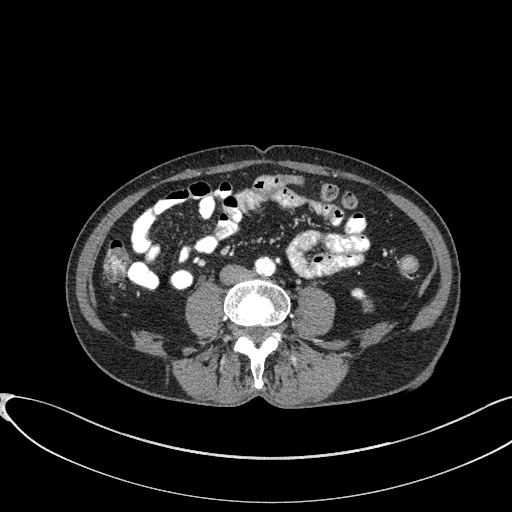
[im 581/980  soft-tissue]
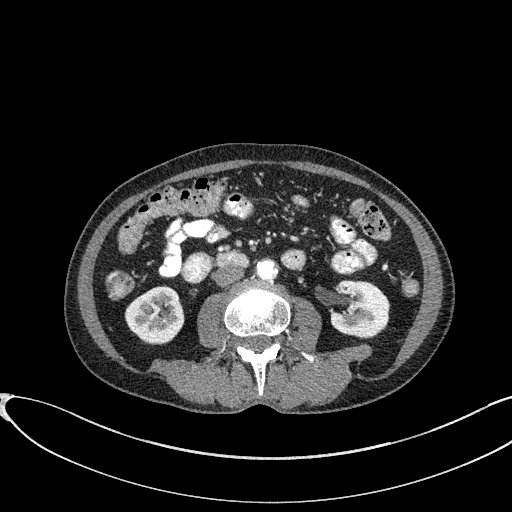
[im 653/980  soft-tissue]
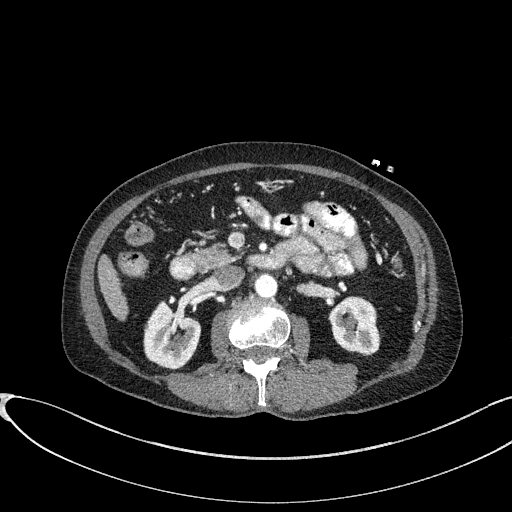
[im 653/980  bone]
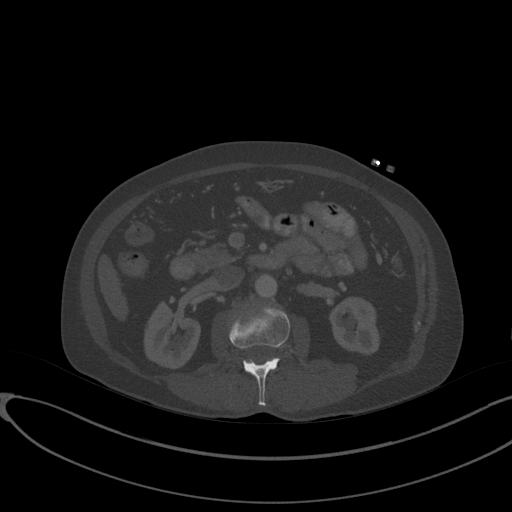
[im 726/980  soft-tissue]
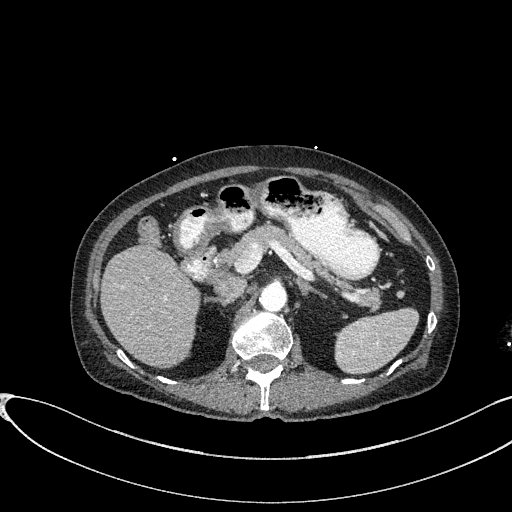
[im 798/980  soft-tissue]
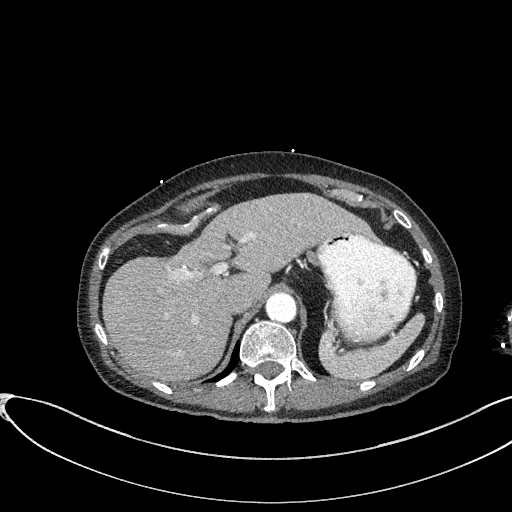
[im 871/980  soft-tissue]
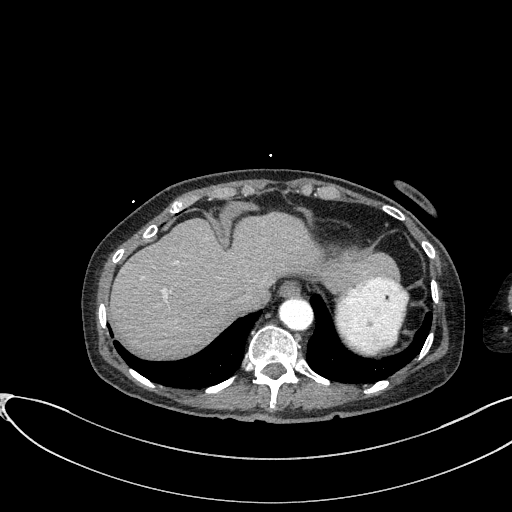
[im 943/980  soft-tissue]
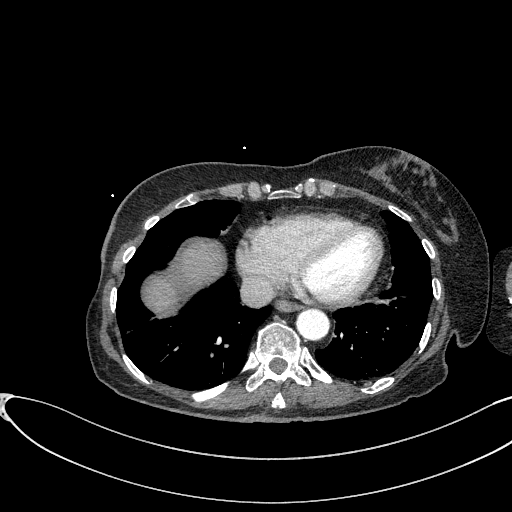

[Series 6: a/p w/ cor · coronal · 0.77mm/px · 3 of 129 slices shown]
[im 43/129  soft-tissue]
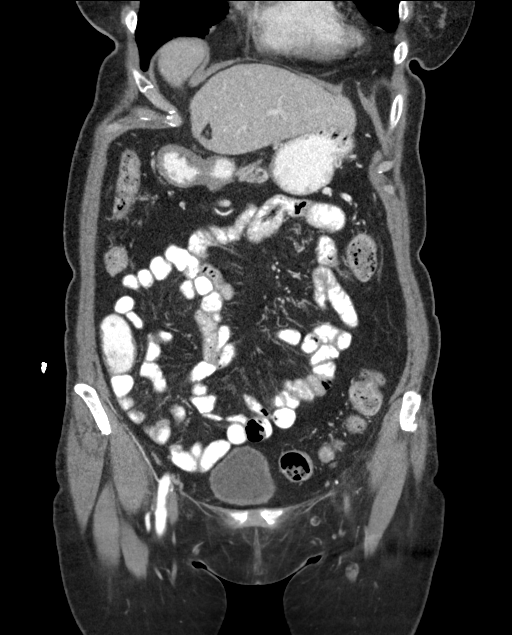
[im 57/129  soft-tissue]
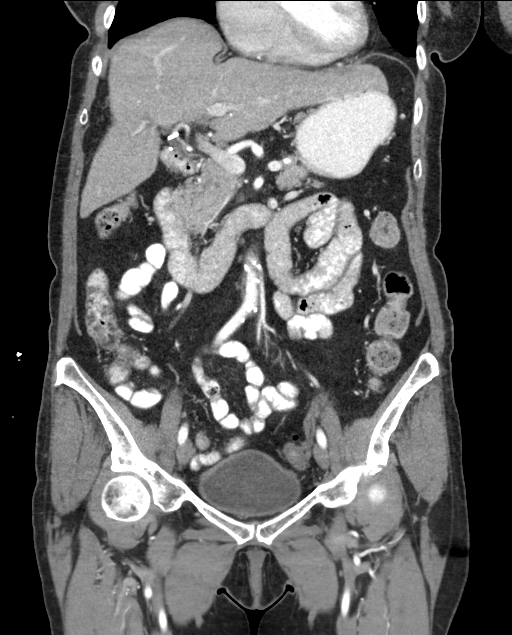
[im 72/129  soft-tissue]
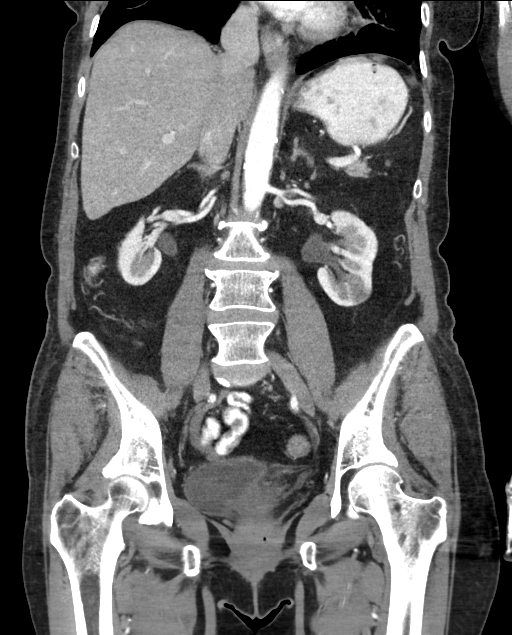

[16 of 46 positions shown; findings below may reference images not displayed]

FINDINGS: Lower chest: Linear scarring or atelectasis in the lower lobes is
stable. No pericardial or pleural effusion.

Hepatobiliary: Surgically absent gallbladder as before. Negative
liver.

Pancreas: Negative.

Spleen: Negative.

Adrenals/Urinary Tract: Normal adrenal glands.

Stable and negative kidneys. Symmetric renal enhancement and
contrast excretion. Normal proximal ureters. Unremarkable urinary
bladder.

Stomach/Bowel: Redundant distal sigmoid colon with diverticulosis.
No definite sigmoid or rectal active inflammation. Mild retained
stool upstream. But fairly decompressed large bowel overall. Oral
contrast has reached the ascending colon. Negative terminal ileum.
Diminutive or absent appendix. The right colon appears mildly
indistinct (coronal image 66), but there is no convincing mesenteric
stranding.

No dilated large bowel. Negative stomach and duodenum. No free air,
free fluid.

Vascular/Lymphatic: Aortoiliac calcified atherosclerosis. Major
arterial structures in the abdomen and pelvis remain patent. Portal
venous system appears patent.

No lymphadenopathy.

Reproductive: Surgically absent uterus. Diminutive or absent
ovaries.

Other: No pelvic free fluid.

Musculoskeletal: No acute osseous abnormality identified.
IMPRESSION: No definite bowel inflammation. No acute or inflammatory process
identified.

## 2021-03-19 DIAGNOSIS — M792 Neuralgia and neuritis, unspecified: Secondary | ICD-10-CM

## 2021-03-19 HISTORY — DX: Neuralgia and neuritis, unspecified: M79.2

## 2021-08-22 DIAGNOSIS — Z789 Other specified health status: Secondary | ICD-10-CM

## 2021-08-22 HISTORY — DX: Other specified health status: Z78.9

## 2021-10-30 ENCOUNTER — Other Ambulatory Visit: Payer: Self-pay

## 2021-10-30 DIAGNOSIS — J22 Unspecified acute lower respiratory infection: Secondary | ICD-10-CM | POA: Insufficient documentation

## 2021-10-30 DIAGNOSIS — B370 Candidal stomatitis: Secondary | ICD-10-CM | POA: Insufficient documentation

## 2021-10-30 DIAGNOSIS — Z87442 Personal history of urinary calculi: Secondary | ICD-10-CM | POA: Insufficient documentation

## 2021-10-30 DIAGNOSIS — R011 Cardiac murmur, unspecified: Secondary | ICD-10-CM | POA: Insufficient documentation

## 2021-10-30 DIAGNOSIS — K529 Noninfective gastroenteritis and colitis, unspecified: Secondary | ICD-10-CM | POA: Insufficient documentation

## 2021-10-30 DIAGNOSIS — I341 Nonrheumatic mitral (valve) prolapse: Secondary | ICD-10-CM | POA: Insufficient documentation

## 2021-10-30 DIAGNOSIS — I471 Supraventricular tachycardia, unspecified: Secondary | ICD-10-CM | POA: Insufficient documentation

## 2021-10-30 DIAGNOSIS — I209 Angina pectoris, unspecified: Secondary | ICD-10-CM | POA: Insufficient documentation

## 2021-10-30 DIAGNOSIS — G542 Cervical root disorders, not elsewhere classified: Secondary | ICD-10-CM | POA: Insufficient documentation

## 2021-10-30 DIAGNOSIS — N3090 Cystitis, unspecified without hematuria: Secondary | ICD-10-CM | POA: Insufficient documentation

## 2021-10-30 DIAGNOSIS — M1990 Unspecified osteoarthritis, unspecified site: Secondary | ICD-10-CM | POA: Insufficient documentation

## 2021-10-30 DIAGNOSIS — E785 Hyperlipidemia, unspecified: Secondary | ICD-10-CM | POA: Insufficient documentation

## 2021-10-30 DIAGNOSIS — K279 Peptic ulcer, site unspecified, unspecified as acute or chronic, without hemorrhage or perforation: Secondary | ICD-10-CM | POA: Insufficient documentation

## 2021-10-30 DIAGNOSIS — E871 Hypo-osmolality and hyponatremia: Secondary | ICD-10-CM | POA: Insufficient documentation

## 2021-10-30 DIAGNOSIS — K5790 Diverticulosis of intestine, part unspecified, without perforation or abscess without bleeding: Secondary | ICD-10-CM | POA: Insufficient documentation

## 2021-10-30 DIAGNOSIS — G8929 Other chronic pain: Secondary | ICD-10-CM | POA: Insufficient documentation

## 2021-10-30 DIAGNOSIS — R519 Headache, unspecified: Secondary | ICD-10-CM | POA: Insufficient documentation

## 2021-10-30 DIAGNOSIS — T8859XA Other complications of anesthesia, initial encounter: Secondary | ICD-10-CM | POA: Insufficient documentation

## 2021-10-30 DIAGNOSIS — K219 Gastro-esophageal reflux disease without esophagitis: Secondary | ICD-10-CM | POA: Insufficient documentation

## 2021-10-30 DIAGNOSIS — N6019 Diffuse cystic mastopathy of unspecified breast: Secondary | ICD-10-CM | POA: Insufficient documentation

## 2021-10-30 DIAGNOSIS — I493 Ventricular premature depolarization: Secondary | ICD-10-CM | POA: Insufficient documentation

## 2021-10-30 DIAGNOSIS — K559 Vascular disorder of intestine, unspecified: Secondary | ICD-10-CM | POA: Insufficient documentation

## 2021-10-30 DIAGNOSIS — I10 Essential (primary) hypertension: Secondary | ICD-10-CM | POA: Insufficient documentation

## 2021-10-30 DIAGNOSIS — D649 Anemia, unspecified: Secondary | ICD-10-CM | POA: Insufficient documentation

## 2021-10-30 DIAGNOSIS — I059 Rheumatic mitral valve disease, unspecified: Secondary | ICD-10-CM | POA: Insufficient documentation

## 2021-10-30 DIAGNOSIS — Z9889 Other specified postprocedural states: Secondary | ICD-10-CM | POA: Insufficient documentation

## 2021-10-30 DIAGNOSIS — E119 Type 2 diabetes mellitus without complications: Secondary | ICD-10-CM | POA: Insufficient documentation

## 2021-10-30 DIAGNOSIS — Z8489 Family history of other specified conditions: Secondary | ICD-10-CM | POA: Insufficient documentation

## 2021-11-27 ENCOUNTER — Encounter: Payer: Self-pay | Admitting: Cardiology

## 2021-11-27 ENCOUNTER — Ambulatory Visit (INDEPENDENT_AMBULATORY_CARE_PROVIDER_SITE_OTHER): Payer: Medicare Other | Admitting: Cardiology

## 2021-11-27 VITALS — BP 144/80 | HR 75 | Ht 65.0 in | Wt 119.6 lb

## 2021-11-27 DIAGNOSIS — Z789 Other specified health status: Secondary | ICD-10-CM | POA: Diagnosis not present

## 2021-11-27 DIAGNOSIS — I10 Essential (primary) hypertension: Secondary | ICD-10-CM

## 2021-11-27 DIAGNOSIS — R011 Cardiac murmur, unspecified: Secondary | ICD-10-CM

## 2021-11-27 DIAGNOSIS — I209 Angina pectoris, unspecified: Secondary | ICD-10-CM

## 2021-11-27 DIAGNOSIS — I471 Supraventricular tachycardia: Secondary | ICD-10-CM

## 2021-11-27 HISTORY — DX: Cardiac murmur, unspecified: R01.1

## 2021-11-27 MED ORDER — NITROGLYCERIN 0.4 MG SL SUBL: 0.4000 mg | SUBLINGUAL_TABLET | SUBLINGUAL | 6 refills | Status: DC | PRN

## 2021-11-27 NOTE — Patient Instructions (Signed)
Medication Instructions:  Your physician has recommended you make the following change in your medication:   Take 81 mg coated aspirin daily. Use nitroglycerin as needed for chest pain.  *If you need a refill on your cardiac medications before your next appointment, please call your pharmacy*   Lab Work: Your physician recommends that you have a BMET and CBC today in the office for your upcoming procedure.  If you have labs (blood work) drawn today and your tests are completely normal, you will receive your results only by: MyChart Message (if you have MyChart) OR A paper copy in the mail If you have any lab test that is abnormal or we need to change your treatment, we will call you to review the results.   Testing/Procedures:  [facility] [place] [address] [place] [address] Dept: [phone] Loc: [phone]  LOURA PITT  11/27/2021  You are scheduled for a Cardiac Catheterization on Thursday, August 24 with Dr. Nicki Guadalajara.  1. Please arrive at the Main Entrance A at [facility]: [address] [place], [place] [address] at 7:00 AM (This time is two hours before your procedure to ensure your preparation). Free valet parking service is available.   Special note: Every effort is made to have your procedure done on time. Please understand that emergencies sometimes delay scheduled procedures.  2. Diet: Do not eat solid foods after midnight.  You may have clear liquids until 5 AM upon the day of the procedure.  3. Labs: You had your labs done in the office.  4. Medication instructions in preparation for your procedure:   Contrast Allergy: No  On the morning of your procedure, take Aspirin and any morning medicines NOT listed above.  You may use sips of water.  5. Plan to go home the same day, you will only stay overnight if medically necessary. 6. You MUST have a responsible  adult to drive you home. 7. An adult MUST be with you the first 24 hours after you arrive home. 8. Bring a current list of your medications, and the last time and date medication taken. 9. Bring ID and current insurance cards. 10.Please wear clothes that are easy to get on and off and wear slip-on shoes.  Thank you for allowing [place] to care for you!   -- [facility]    Follow-Up: At [facility], you and your health needs are our priority.  As part of our continuing mission to provide you with exceptional heart care, we have created designated Provider Care Teams.  These Care Teams include your primary Cardiologist (physician) and Advanced Practice Providers (APPs -  Physician Assistants and Nurse Practitioners) who all work together to provide you with the care you need, when you need it.  We recommend signing up for the patient portal called "MyChart".  Sign up information is provided on this After Visit Summary.  MyChart is used to connect with patients for Virtual Visits (Telemedicine).  Patients are able to view lab/test results, encounter notes, upcoming appointments, etc.  Non-urgent messages can be sent to your provider as well.   To learn more about what you can do with MyChart, go to [email or web address].    Your next appointment:   6 month(s)  The format for your next appointment:   In Person  Provider:   Belva Crome, MD   Other Instructions  Coronary Angiogram With Stent Coronary angiogram with  stent placement is a procedure to widen or open a narrow blood vessel of the heart (coronary artery). Arteries may become blocked by cholesterol buildup (plaques) in the lining of the artery wall. When a coronary artery becomes partially blocked, blood flow to that area decreases. This may lead to chest pain or a heart attack (myocardial infarction). A stent is a small piece of metal that looks like mesh or spring. Stent placement may be done as  treatment after a heart attack, or to prevent a heart attack if a blocked artery is found by a coronary angiogram. Let your health care provider know about: Any allergies you have, including allergies to medicines or contrast dye. All medicines you are taking, including vitamins, herbs, eye drops, creams, and over-the-counter medicines. Any problems you or family members have had with anesthetic medicines. Any blood disorders you have. Any surgeries you have had. Any medical conditions you have, including kidney problems or kidney failure. Whether you are pregnant or may be pregnant. Whether you are breastfeeding. What are the risks? Generally, this is a safe procedure. However, serious problems may occur, including: Damage to nearby structures or organs, such as the heart, blood vessels, or kidneys. A return of blockage. Bleeding, infection, or bruising at the insertion site. A collection of blood under the skin (hematoma) at the insertion site. A blood clot in another part of the body. Allergic reaction to medicines or dyes. Bleeding into the abdomen (retroperitoneal bleeding). Stroke (rare). Heart attack (rare). What happens before the procedure? Staying hydrated Follow instructions from your health care provider about hydration, which may include: Up to 2 hours before the procedure - you may continue to drink clear liquids, such as water, clear fruit juice, black coffee, and plain tea.    Eating and drinking restrictions Follow instructions from your health care provider about eating and drinking, which may include: 8 hours before the procedure - stop eating heavy meals or foods, such as meat, fried foods, or fatty foods. 6 hours before the procedure - stop eating light meals or foods, such as toast or cereal. 2 hours before the procedure - stop drinking clear liquids. Medicines Ask your health care provider about: Changing or stopping your regular medicines. This is especially  important if you are taking diabetes medicines or blood thinners. Taking medicines such as aspirin and ibuprofen. These medicines can thin your blood. Do not take these medicines unless your health care provider tells you to take them. Generally, aspirin is recommended before a thin tube, called a catheter, is passed through a blood vessel and inserted into the heart (cardiac catheterization). Taking over-the-counter medicines, vitamins, herbs, and supplements. General instructions Do not use any products that contain nicotine or tobacco for at least 4 weeks before the procedure. These products include cigarettes, e-cigarettes, and chewing tobacco. If you need help quitting, ask your health care provider. Plan to have someone take you home from the hospital or clinic. If you will be going home right after the procedure, plan to have someone with you for 24 hours. You may have tests and imaging procedures. Ask your health care provider: How your insertion site will be marked. Ask which artery will be used for the procedure. What steps will be taken to help prevent infection. These may include: Removing hair at the insertion site. Washing skin with a germ-killing soap. Taking antibiotic medicine. What happens during the procedure? An IV will be inserted into one of your veins. Electrodes may be placed on your  chest to monitor your heart rate during the procedure. You will be given one or more of the following: A medicine to help you relax (sedative). A medicine to numb the area (local anesthetic) for catheter insertion. A small incision will be made for catheter insertion. The catheter will be inserted into an artery using a guide wire. The location may be in your groin, your wrist, or the fold of your arm (near your elbow). An X-ray procedure (fluoroscopy) will be used to help guide the catheter to the opening of the heart arteries. A dye will be injected into the catheter. X-rays will be  taken. The dye helps to show where any narrowing or blockages are located in the arteries. Tell your health care provider if you have chest pain or trouble breathing. A tiny wire will be guided to the blocked spot, and a balloon will be inflated to make the artery wider. The stent will be expanded to crush the plaques into the wall of the vessel. The stent will hold the area open and improve the blood flow. Most stents have a drug coating to reduce the risk of the stent narrowing over time. The artery may be made wider using a drill, laser, or other tools that remove plaques. The catheter will be removed when the blood flow improves. The stent will stay where it was placed, and the lining of the artery will grow over it. A bandage (dressing) will be placed on the insertion site. Pressure will be applied to stop bleeding. The IV will be removed. This procedure may vary among health care providers and hospitals.    What happens after the procedure? Your blood pressure, heart rate, breathing rate, and blood oxygen level will be monitored until you leave the hospital or clinic. If the procedure is done through the leg, you will lie flat in bed for a few hours or for as long as told by your health care provider. You will be instructed not to bend or cross your legs. The insertion site and the pulse in your foot or wrist will be checked often. You may have more blood tests, X-rays, and a test that records the electrical activity of your heart (electrocardiogram, or ECG). Do not drive for 24 hours if you were given a sedative during your procedure. Summary Coronary angiogram with stent placement is a procedure to widen or open a narrowed coronary artery. This is done to treat heart problems. Before the procedure, let your health care provider know about all the medical conditions and surgeries you have or have had. This is a safe procedure. However, some problems may occur, including damage to nearby  structures or organs, bleeding, blood clots, or allergies. Follow your health care provider's instructions about eating, drinking, medicines, and other lifestyle changes, such as quitting tobacco use before the procedure. This information is not intended to replace advice given to you by your health care provider. Make sure you discuss any questions you have with your health care provider. Document Revised: 10/19/2018 Document Reviewed: 10/19/2018 Elsevier Patient Education  2021 Elsevier Inc.  Aspirin and Your Heart Aspirin is a medicine that prevents the platelets in your blood from sticking together. Platelets are the cells that your blood uses for clotting. Aspirin can be used to help reduce the risk of blood clots, heart attacks, and other heart-related problems. What are the risks? Daily use of aspirin can cause side effects. Some of these include: Bleeding. Bleeding can be minor or serious. An example  of minor bleeding is bleeding from a cut, and the bleeding does not stop. An example of more serious bleeding is stomach bleeding or, rarely, bleeding into the brain. Your risk of bleeding increases if you are also taking NSAIDs, such as ibuprofen. Increased bruising. Upset stomach. An allergic reaction. People who have growths inside the nose (nasal polyps) have an increased risk of developing an aspirin allergy. How to use aspirin to care for your heart Take aspirin only as told by your health care provider. Make sure that you understand how much to take and what form to take. The two forms of aspirin are: Non-enteric-coated.This type of aspirin does not have a coating and is absorbed quickly. This type of aspirin also comes in a chewable form. Enteric-coated. This type of aspirin has a coating that releases the medicine very slowly. Enteric-coated aspirin might cause less stomach upset than non-enteric-coated aspirin. This type of aspirin should not be chewed or crushed. Work with your  health care provider to find out whether it is safe and beneficial for you to take aspirin daily. Taking aspirin daily may be helpful if: You have had a heart attack or chest pain, or you are at risk for a heart attack. You have a condition in which certain heart vessels are blocked (coronary artery disease), and you have had a procedure to treat it. Examples are: Open-heart surgery, such as coronary artery bypass surgery (CABG). Coronary angioplasty,which is done to widen a blood vessel of your heart. Having a small mesh tube, or stent, placed in your coronary artery. You have had certain types of stroke or a mini-stroke known as a transient ischemic attack (TIA). You have a narrowing of the arteries that supply the limbs (peripheral artery disease, or PAD). You have long-term (chronic) heart rhythm problems, such as atrial fibrillation, and your health care provider thinks aspirin may help. You have valve disease or have had surgery on a valve. You are considered at increased risk of developing coronary artery disease or PAD.    Follow these instructions at home Medicines Take over-the-counter and prescription medicines only as told by your health care provider. If you are taking blood thinners: Talk with your health care provider before you take any medicines that contain aspirin or NSAIDs, such as ibuprofen. These medicines increase your risk for dangerous bleeding. Take your medicine exactly as told, at the same time every day. Avoid activities that could cause injury or bruising, and follow instructions about how to prevent falls. Wear a medical alert bracelet or carry a card that lists what medicines you take. General instructions Do not drink alcohol if: Your health care provider tells you not to drink. You are pregnant, may be pregnant, or are planning to become pregnant. If you drink alcohol: Limit how much you use to: 0-1 drink a day for women. 0-2 drinks a day for men. Be  aware of how much alcohol is in your drink. In the [place] one drink equals one 12 oz bottle of beer (355 mL), one 5 oz glass of wine (148 mL), or one 1 oz glass of hard liquor (44 mL). Keep all follow-up visits as told by your health care provider. This is important. Where to find more information The American Heart Association: www.heart.org Contact a health care provider if you have: Unusual bleeding or bruising. Stomach pain or nausea. Ringing in your ears. An allergic reaction that causes hives, itchy skin, or swelling of the lips, tongue, or face. Get help right  away if: You notice that your bowel movements are bloody, or dark red or black in color. You vomit or cough up blood. You have blood in your urine. You cough, breathe loudly (wheeze), or feel short of breath. You have chest pain, especially if the pain spreads to your arms, back, neck, or jaw. You have a headache with confusion. You have any symptoms of a stroke. "BE FAST" is an easy way to remember the main warning signs of a stroke: B - Balance. Signs are dizziness, sudden trouble walking, or loss of balance. E - Eyes. Signs are trouble seeing or a sudden change in vision. F - Face. Signs are sudden weakness or numbness of the face, or the face or eyelid drooping on one side. A - Arms. Signs are weakness or numbness in an arm. This happens suddenly and usually on one side of the body. S - Speech. Signs are sudden trouble speaking, slurred speech, or trouble understanding what people say. T - Time. Time to call emergency services. Write down what time symptoms started. You have other signs of a stroke, such as: A sudden, severe headache with no known cause. Nausea or vomiting. Seizure. These symptoms may represent a serious problem that is an emergency. Do not wait to see if the symptoms will go away. Get medical help right away. Call your local emergency services (911 in the [place] Do not drive yourself to the  hospital. Summary Aspirin use can help reduce the risk of blood clots, heart attacks, and other heart-related problems. Daily use of aspirin can cause side effects. Take aspirin only as told by your health care provider. Make sure that you understand how much to take and what form to take. Your health care provider will help you determine whether it is safe and beneficial for you to take aspirin daily. This information is not intended to replace advice given to you by your health care provider. Make sure you discuss any questions you have with your health care provider. Document Revised: 01/02/2019 Document Reviewed: 01/02/2019 Elsevier Patient Education  2021 Elsevier Inc. Nitroglycerin sublingual tablets What is this medicine? NITROGLYCERIN (nye troe GLI ser in) is a type of vasodilator. It relaxes blood vessels, increasing the blood and oxygen supply to your heart. This medicine is used to relieve chest pain caused by angina. It is also used to prevent chest pain before activities like climbing stairs, going outdoors in cold weather, or sexual activity. This medicine may be used for other purposes; ask your health care provider or pharmacist if you have questions. COMMON BRAND NAME(S): Nitroquick, Nitrostat, Nitrotab What should I tell my health care provider before I take this medicine? They need to know if you have any of these conditions: anemia head injury, recent stroke, or bleeding in the brain liver disease previous heart attack an unusual or allergic reaction to nitroglycerin, other medicines, foods, dyes, or preservatives pregnant or trying to get pregnant breast-feeding How should I use this medicine? Take this medicine by mouth as needed. Use at the first sign of an angina attack (chest pain or tightness). You can also take this medicine 5 to 10 minutes before an event likely to produce chest pain. Follow the directions exactly as written on the prescription label. Place one  tablet under your tongue and let it dissolve. Do not swallow whole. Replace the dose if you accidentally swallow it. It will help if your mouth is not dry. Saliva around the tablet will help it to dissolve  more quickly. Do not eat or drink, smoke or chew tobacco while a tablet is dissolving. Sit down when taking this medicine. In an angina attack, you should feel better within 5 minutes after your first dose. You can take a dose every 5 minutes up to a total of 3 doses. If you do not feel better or feel worse after 1 dose, call 9-1-1 at once. Do not take more than 3 doses in 15 minutes. Your health care provider might give you other directions. Follow those directions if he or she does. Do not take your medicine more often than directed. Talk to your health care provider about the use of this medicine in children. Special care may be needed. Overdosage: If you think you have taken too much of this medicine contact a poison control center or emergency room at once. NOTE: This medicine is only for you. Do not share this medicine with others. What if I miss a dose? This does not apply. This medicine is only used as needed. What may interact with this medicine? Do not take this medicine with any of the following medications: certain migraine medicines like ergotamine and dihydroergotamine (DHE) medicines used to treat erectile dysfunction like sildenafil, tadalafil, and vardenafil riociguat This medicine may also interact with the following medications: alteplase aspirin heparin medicines for high blood pressure medicines for mental depression other medicines used to treat angina phenothiazines like chlorpromazine, mesoridazine, prochlorperazine, thioridazine This list may not describe all possible interactions. Give your health care provider a list of all the medicines, herbs, non-prescription drugs, or dietary supplements you use. Also tell them if you smoke, drink alcohol, or use illegal drugs.  Some items may interact with your medicine. What should I watch for while using this medicine? Tell your doctor or health care professional if you feel your medicine is no longer working. Keep this medicine with you at all times. Sit or lie down when you take your medicine to prevent falling if you feel dizzy or faint after using it. Try to remain calm. This will help you to feel better faster. If you feel dizzy, take several deep breaths and lie down with your feet propped up, or bend forward with your head resting between your knees. You may get drowsy or dizzy. Do not drive, use machinery, or do anything that needs mental alertness until you know how this drug affects you. Do not stand or sit up quickly, especially if you are an older patient. This reduces the risk of dizzy or fainting spells. Alcohol can make you more drowsy and dizzy. Avoid alcoholic drinks. Do not treat yourself for coughs, colds, or pain while you are taking this medicine without asking your doctor or health care professional for advice. Some ingredients may increase your blood pressure. What side effects may I notice from receiving this medicine? Side effects that you should report to your doctor or health care professional as soon as possible: allergic reactions (skin rash, itching or hives; swelling of the face, lips, or tongue) low blood pressure (dizziness; feeling faint or lightheaded, falls; unusually weak or tired) low red blood cell counts (trouble breathing; feeling faint; lightheaded, falls; unusually weak or tired) Side effects that usually do not require medical attention (report to your doctor or health care professional if they continue or are bothersome): facial flushing (redness) headache nausea, vomiting This list may not describe all possible side effects. Call your doctor for medical advice about side effects. You may report side effects to FDA  at 1-800-FDA-1088. Where should I keep my medicine? Keep out  of the reach of children. Store at room temperature between 20 and 25 degrees C (68 and 77 degrees F). Store in Retail buyer. Protect from light and moisture. Keep tightly closed. Throw away any unused medicine after the expiration date. NOTE: This sheet is a summary. It may not cover all possible information. If you have questions about this medicine, talk to your doctor, pharmacist, or health care provider.  2021 Elsevier/Gold Standard (2017-12-29 16:46:32)

## 2021-11-27 NOTE — Addendum Note (Signed)
Addended by: Eleonore Chiquito on: 11/27/2021 02:29 PM   Modules accepted: Orders

## 2021-11-27 NOTE — H&P (View-Only) (Signed)
Cardiology Office Note:    Date:  11/27/2021   ID:  Jasmine Farrell, DOB [birth year], MRN [id number]  PCP:  Leane Call, PA-C  Cardiologist:  Garwin Brothers, MD   Referring MD: Leane Call, PA-C    ASSESSMENT:    1. Anginal pain (HCC)   2. Essential hypertension   3. PSVT (paroxysmal supraventricular tachycardia) (HCC)   4. Statin intolerance   5. Cardiac murmur    PLAN:    In order of problems listed above:  Angina pectoris: Patient's symptoms are very concerning and following recommendations were made to her.  She was advised to take a coated baby aspirin on a daily basis.  Sublingual nitroglycerin prescription was sent, its protocol and 911 protocol explained and the patient vocalized understanding questions were answered to the patient's satisfaction.In view of the patient's symptoms, I discussed with the patient options for evaluation. Invasive and noninvasive options were given to the patient. I discussed stress testing and coronary angiography and left heart catheterization at length. Benefits, pros and cons of each approach were discussed at length. Patient had multiple questions which were answered to the patient's satisfaction. Patient opted for invasive evaluation and we will set up for coronary angiography and left heart catheterization. Further recommendations will be made based on the findings with coronary angiography. In the interim if the patient has any significant symptoms in hospital to the nearest emergency room.  I have not discussed CT coronary angiography but she is not keen on it. 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  Diet emphasized. History of hyponatremia: We will check her Chem-7. History of renal insufficiency: I would like to assess this by a Chem-7 also before the coronary angiography.  Hopefully we get enough information from echo that we may not have to push left ventriculography however I  would leave it to the discretion of her invasive colleague. Murmur: Echocardiogram will be done to assess murmur heard on auscultation. She will be seen in follow-up appointment after coronary angiography.   Medication Adjustments/Labs and Tests Ordered: Current medicines are reviewed at length with the patient today.  Concerns regarding medicines are outlined above.  No orders of the defined types were placed in this encounter.  No orders of the defined types were placed in this encounter.    History of Present Illness:    JERALD Farrell is a 76 y.o. female who is being seen today for the evaluation of chest tightness on exertion at the request of Nodal, Joline Salt, PA-C.  Patient is a pleasant 76 year old female.  She has past medical history of hyponatremia.  She mentions to me that she has history of diabetes mellitus.  Over the past several weeks she is noticing substernal chest tightness on exertion.  She takes care of her husband who is disabled.  With activity she has to stop and rest and feel better and then she continues to walk.  This has been increasing over the past several weeks.  For this reason she wants to be evaluated.  At the time of my evaluation, the patient is alert awake oriented and in no distress.  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  Cervical nerve root impingement    Cervical spondylosis with myelopathy and radiculopathy    Change in bowel habit    Chronic bilateral low back pain with left-sided sciatica    Chronic nausea    Closed fracture of left proximal humerus 09/29/2016   Colitis    Complication of anesthesia    narrow airway per pt can have a bp drop and hard to wake up   Constipation    DDD (degenerative disc disease), cervical  01/29/2016   DDD (degenerative disc disease), lumbar 02/18/2016   Diabetes mellitus type II, non insulin dependent (HCC) 10/27/2018   Diabetes mellitus without complication (HCC)    Diffuse cystic mastopathy    Diverticulosis    Dysuria 10/30/2020   Elevated TSH 11/29/2019   Encounter for Medicare annual wellness exam 03/23/2019   Essential hypertension    Family history of adverse reaction to anesthesia    brother and sister hard to wake up   Fibrocystic breast    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   Generalized abdominal pain    GERD (gastroesophageal reflux disease)    Headache    Heart murmur    History of kidney stones    Hyperhidrosis 12/16/2017   Hyperlipidemia    Hyponatremia 09/16/2018   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HCC)    Laceration of left thumb without foreign body without damage to nail 09/15/2017   Left-sided chest wall pain 10/11/2015   Loss of weight    Low serum cortisol level 09/21/2018   Low sodium syndrome    Lower respiratory infection (e.g., bronchitis, pneumonia, pneumonitis, pulmonitis)    Mild renal insufficiency 10/27/2018   Mitral valve disorder    MVP   Mitral valve prolapse    Neck pain 01/29/2016   Neuralgia of chest 03/19/2021   Oral thrush    Paroxysmal atrial tachycardia (HCC)    Peptic ulcer    PONV (postoperative nausea and vomiting)    Precordial pain    PSVT (paroxysmal supraventricular tachycardia) (HCC)    PVC's (premature ventricular contractions)    Spider veins    Splinter of finger 02/27/2019   Formatting of this note might be different from the original. Added automatically from request for surgery [id number]   Statin intolerance 08/22/2021   Suspected COVID-19 virus infection 07/28/2018   Formatting of this note might be different from the original. 2020   SVT (supraventricular tachycardia) (HCC)    Sweating abnormality 10/28/2017   Traumatic closed nondisplaced fracture of anatomical neck of left humerus with routine healing 04/27/2017    Varicose veins of bilateral lower extremities with other complications    Weakness     Past Surgical History:  Procedure Laterality Date   ABDOMINAL HYSTERECTOMY     ANTERIOR AND POSTERIOR VAGINAL REPAIR     ANTERIOR CERVICAL DECOMP/DISCECTOMY FUSION N/A 08/19/2016   Procedure: ANTERIOR CERVICAL DECOMPRESSION/DISCECTOMY FUSION CERVICAL FOUR- CERVICAL FIVE, CERVICAL FIVE- CERVICAL SIX;  Surgeon: Tressie Stalker, MD;  Location: [facility] OR;  Service: Neurosurgery;  Laterality: N/A;  ANTERIOR CERVICAL DECOMPRESSION/DISCECTOMY FUSION CERVICAL 4- CERVICAL 5, CERVICAL 5- CERVICAL6   APPENDECTOMY     BREAST LUMPECTOMY     CHOLECYSTECTOMY     COLONOSCOPY WITH PROPOFOL N/A 10/29/2018   Procedure: COLONOSCOPY WITH PROPOFOL;  Surgeon: Iva Boop, MD;  Location: [facility] ENDOSCOPY;  Service: Endoscopy;  Laterality: N/A;   ELBOW SURGERY Right    HERNIA REPAIR     IR RADIOLOGIST EVAL & MGMT  10/04/2018    Current Medications: Current Meds  Medication Sig   acetaminophen (TYLENOL) 500 MG tablet Take 1,000 mg by mouth as needed for pain.   ascorbic acid (VITAMIN C) 500 MG tablet Take 1 tablet by mouth daily.   Cholecalciferol (VITAMIN D-1000 MAX ST) 25 MCG (1000 UT) tablet Take 1,000 Units by mouth daily.   Cyanocobalamin (B-12) 2500 MCG TABS Take 2,500 mcg by mouth daily as needed (to boost the immune system). Sublingual   cyclobenzaprine (FLEXERIL) 10 MG tablet Take 10 mg by mouth as needed for muscle spasms.   famotidine (PEPCID) 20 MG tablet Take 20 mg by mouth 2 (two) times daily.   glipiZIDE (GLUCOTROL XL) 2.5 MG 24 hr tablet Take 1 tablet by mouth daily.   lidocaine (LIDODERM) 5 % Place 1 patch onto the skin daily as needed (pain).   pantoprazole (PROTONIX) 40 MG tablet Take 1 tablet by mouth daily.   Polyethyl Glycol-Propyl Glycol (SYSTANE ULTRA OP) Place 1 drop into both eyes 2 (two) times daily.   propranolol ER (INDERAL LA) 80 MG 24 hr capsule Take 80 mg by mouth daily.   sodium chloride 1 g  tablet Take 1 tablet (1 g total) by mouth daily. (Patient taking differently: Take 2 g by mouth daily.)     Allergies:   Dilaudid [hydromorphone], Fentanyl, Latex, Morphine and related, Aspirin, Codeine, Nsaids, Other, Oxycodone, Statins, Verapamil, and Adhesive [tape]   Social History   Socioeconomic History   Marital status: Married    Spouse name: Not on file   Number of children: 2   Years of education: Not on file   Highest education level: Not on file  Occupational History   Occupation: retired    Comment: Charity fundraiser  Tobacco Use   Smoking status: Never   Smokeless tobacco: Never  Vaping Use   Vaping Use: Never used  Substance and Sexual Activity   Alcohol use: No   Drug use: No   Sexual activity: Not on file  Other Topics Concern   Not on file  Social History Narrative   Retired Charity fundraiser   Married 2 grown children   No EtOH/tobacco/drugs   Social Determinants of Corporate investment banker Strain: Not on Ship broker Insecurity: Not on file  Transportation Needs: Not on file  Physical Activity: Not on file  Stress: Not on file  Social Connections: Not on file     Family History: The patient's family history includes Cancer in her mother and sister; Diverticulitis in her sister; Esophageal cancer in her father; Heart disease in her mother; Hypertension in her mother. There is no history of Colon cancer.  ROS:   Please see the history of present illness.    All other systems reviewed and are negative.  EKGs/Labs/Other Studies Reviewed:    The following studies were reviewed today: EKG is revealing of sinus rhythm with poor anterior forces   Recent Labs: No results found for requested labs within last 365 days.  Recent Lipid Panel No results found for: "CHOL", "TRIG", "HDL", "CHOLHDL", "VLDL", "LDLCALC", "LDLDIRECT"  Physical Exam:    VS:  BP (!) 144/80   Pulse 75   Ht 5\' 5"  (1.651 m)   Wt 119 lb 9.6 oz (54.3 kg)   SpO2 97%   BMI 19.90 kg/m     Wt Readings  from Last 3 Encounters:  11/27/21 119 lb 9.6 oz (54.3 kg)  10/23/19 132 lb 6 oz (60 kg)  07/10/19 134 lb (60.8 kg)     GEN: Patient is in  no acute distress HEENT: Normal NECK: No JVD; No carotid bruits LYMPHATICS: No lymphadenopathy CARDIAC: S1 S2 regular, 2/6 systolic murmur at the apex. RESPIRATORY:  Clear to auscultation without rales, wheezing or rhonchi  ABDOMEN: Soft, non-tender, non-distended MUSCULOSKELETAL:  No edema; No deformity  SKIN: Warm and dry NEUROLOGIC:  Alert and oriented x 3 PSYCHIATRIC:  Normal affect    Signed, Garwin Brothers, MD  11/27/2021 2:17 PM    [facility]

## 2021-11-27 NOTE — Progress Notes (Signed)
Cardiology Office Note:    Date:  11/27/2021   ID:  Jasmine Farrell, DOB [birth year], MRN [id number]  PCP:  Leane Call, PA-C  Cardiologist:  Garwin Brothers, MD   Referring MD: Leane Call, PA-C    ASSESSMENT:    1. Anginal pain (HCC)   2. Essential hypertension   3. PSVT (paroxysmal supraventricular tachycardia) (HCC)   4. Statin intolerance   5. Cardiac murmur    PLAN:    In order of problems listed above:  Angina pectoris: Patient's symptoms are very concerning and following recommendations were made to her.  She was advised to take a coated baby aspirin on a daily basis.  Sublingual nitroglycerin prescription was sent, its protocol and 911 protocol explained and the patient vocalized understanding questions were answered to the patient's satisfaction.In view of the patient's symptoms, I discussed with the patient options for evaluation. Invasive and noninvasive options were given to the patient. I discussed stress testing and coronary angiography and left heart catheterization at length. Benefits, pros and cons of each approach were discussed at length. Patient had multiple questions which were answered to the patient's satisfaction. Patient opted for invasive evaluation and we will set up for coronary angiography and left heart catheterization. Further recommendations will be made based on the findings with coronary angiography. In the interim if the patient has any significant symptoms in hospital to the nearest emergency room.  I have not discussed CT coronary angiography but she is not keen on it. 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  Diet emphasized. History of hyponatremia: We will check her Chem-7. History of renal insufficiency: I would like to assess this by a Chem-7 also before the coronary angiography.  Hopefully we get enough information from echo that we may not have to push left ventriculography however I  would leave it to the discretion of her invasive colleague. Murmur: Echocardiogram will be done to assess murmur heard on auscultation. She will be seen in follow-up appointment after coronary angiography.   Medication Adjustments/Labs and Tests Ordered: Current medicines are reviewed at length with the patient today.  Concerns regarding medicines are outlined above.  No orders of the defined types were placed in this encounter.  No orders of the defined types were placed in this encounter.    History of Present Illness:    Jasmine Farrell is a 76 y.o. female who is being seen today for the evaluation of chest tightness on exertion at the request of Nodal, Joline Salt, PA-C.  Patient is a pleasant 76 year old female.  She has past medical history of hyponatremia.  She mentions to me that she has history of diabetes mellitus.  Over the past several weeks she is noticing substernal chest tightness on exertion.  She takes care of her husband who is disabled.  With activity she has to stop and rest and feel better and then she continues to walk.  This has been increasing over the past several weeks.  For this reason she wants to be evaluated.  At the time of my evaluation, the patient is alert awake oriented and in no distress.  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  Cervical nerve root impingement    Cervical spondylosis with myelopathy and radiculopathy    Change in bowel habit    Chronic bilateral low back pain with left-sided sciatica    Chronic nausea    Closed fracture of left proximal humerus 09/29/2016   Colitis    Complication of anesthesia    narrow airway per pt can have a bp drop and hard to wake up   Constipation    DDD (degenerative disc disease), cervical  01/29/2016   DDD (degenerative disc disease), lumbar 02/18/2016   Diabetes mellitus type II, non insulin dependent (HCC) 10/27/2018   Diabetes mellitus without complication (HCC)    Diffuse cystic mastopathy    Diverticulosis    Dysuria 10/30/2020   Elevated TSH 11/29/2019   Encounter for Medicare annual wellness exam 03/23/2019   Essential hypertension    Family history of adverse reaction to anesthesia    brother and sister hard to wake up   Fibrocystic breast    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   Generalized abdominal pain    GERD (gastroesophageal reflux disease)    Headache    Heart murmur    History of kidney stones    Hyperhidrosis 12/16/2017   Hyperlipidemia    Hyponatremia 09/16/2018   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HCC)    Laceration of left thumb without foreign body without damage to nail 09/15/2017   Left-sided chest wall pain 10/11/2015   Loss of weight    Low serum cortisol level 09/21/2018   Low sodium syndrome    Lower respiratory infection (e.g., bronchitis, pneumonia, pneumonitis, pulmonitis)    Mild renal insufficiency 10/27/2018   Mitral valve disorder    MVP   Mitral valve prolapse    Neck pain 01/29/2016   Neuralgia of chest 03/19/2021   Oral thrush    Paroxysmal atrial tachycardia (HCC)    Peptic ulcer    PONV (postoperative nausea and vomiting)    Precordial pain    PSVT (paroxysmal supraventricular tachycardia) (HCC)    PVC's (premature ventricular contractions)    Spider veins    Splinter of finger 02/27/2019   Formatting of this note might be different from the original. Added automatically from request for surgery [id number]   Statin intolerance 08/22/2021   Suspected COVID-19 virus infection 07/28/2018   Formatting of this note might be different from the original. 2020   SVT (supraventricular tachycardia) (HCC)    Sweating abnormality 10/28/2017   Traumatic closed nondisplaced fracture of anatomical neck of left humerus with routine healing 04/27/2017    Varicose veins of bilateral lower extremities with other complications    Weakness     Past Surgical History:  Procedure Laterality Date   ABDOMINAL HYSTERECTOMY     ANTERIOR AND POSTERIOR VAGINAL REPAIR     ANTERIOR CERVICAL DECOMP/DISCECTOMY FUSION N/A 08/19/2016   Procedure: ANTERIOR CERVICAL DECOMPRESSION/DISCECTOMY FUSION CERVICAL FOUR- CERVICAL FIVE, CERVICAL FIVE- CERVICAL SIX;  Surgeon: Tressie Stalker, MD;  Location: [facility] OR;  Service: Neurosurgery;  Laterality: N/A;  ANTERIOR CERVICAL DECOMPRESSION/DISCECTOMY FUSION CERVICAL 4- CERVICAL 5, CERVICAL 5- CERVICAL6   APPENDECTOMY     BREAST LUMPECTOMY     CHOLECYSTECTOMY     COLONOSCOPY WITH PROPOFOL N/A 10/29/2018   Procedure: COLONOSCOPY WITH PROPOFOL;  Surgeon: Iva Boop, MD;  Location: [facility] ENDOSCOPY;  Service: Endoscopy;  Laterality: N/A;   ELBOW SURGERY Right    HERNIA REPAIR     IR RADIOLOGIST EVAL & MGMT  10/04/2018    Current Medications: Current Meds  Medication Sig   acetaminophen (TYLENOL) 500 MG tablet Take 1,000 mg by mouth as needed for pain.   ascorbic acid (VITAMIN C) 500 MG tablet Take 1 tablet by mouth daily.   Cholecalciferol (VITAMIN D-1000 MAX ST) 25 MCG (1000 UT) tablet Take 1,000 Units by mouth daily.   Cyanocobalamin (B-12) 2500 MCG TABS Take 2,500 mcg by mouth daily as needed (to boost the immune system). Sublingual   cyclobenzaprine (FLEXERIL) 10 MG tablet Take 10 mg by mouth as needed for muscle spasms.   famotidine (PEPCID) 20 MG tablet Take 20 mg by mouth 2 (two) times daily.   glipiZIDE (GLUCOTROL XL) 2.5 MG 24 hr tablet Take 1 tablet by mouth daily.   lidocaine (LIDODERM) 5 % Place 1 patch onto the skin daily as needed (pain).   pantoprazole (PROTONIX) 40 MG tablet Take 1 tablet by mouth daily.   Polyethyl Glycol-Propyl Glycol (SYSTANE ULTRA OP) Place 1 drop into both eyes 2 (two) times daily.   propranolol ER (INDERAL LA) 80 MG 24 hr capsule Take 80 mg by mouth daily.   sodium chloride 1 g  tablet Take 1 tablet (1 g total) by mouth daily. (Patient taking differently: Take 2 g by mouth daily.)     Allergies:   Dilaudid [hydromorphone], Fentanyl, Latex, Morphine and related, Aspirin, Codeine, Nsaids, Other, Oxycodone, Statins, Verapamil, and Adhesive [tape]   Social History   Socioeconomic History   Marital status: Married    Spouse name: Not on file   Number of children: 2   Years of education: Not on file   Highest education level: Not on file  Occupational History   Occupation: retired    Comment: Charity fundraiser  Tobacco Use   Smoking status: Never   Smokeless tobacco: Never  Vaping Use   Vaping Use: Never used  Substance and Sexual Activity   Alcohol use: No   Drug use: No   Sexual activity: Not on file  Other Topics Concern   Not on file  Social History Narrative   Retired Charity fundraiser   Married 2 grown children   No EtOH/tobacco/drugs   Social Determinants of Corporate investment banker Strain: Not on Ship broker Insecurity: Not on file  Transportation Needs: Not on file  Physical Activity: Not on file  Stress: Not on file  Social Connections: Not on file     Family History: The patient's family history includes Cancer in her mother and sister; Diverticulitis in her sister; Esophageal cancer in her father; Heart disease in her mother; Hypertension in her mother. There is no history of Colon cancer.  ROS:   Please see the history of present illness.    All other systems reviewed and are negative.  EKGs/Labs/Other Studies Reviewed:    The following studies were reviewed today: EKG is revealing of sinus rhythm with poor anterior forces   Recent Labs: No results found for requested labs within last 365 days.  Recent Lipid Panel No results found for: "CHOL", "TRIG", "HDL", "CHOLHDL", "VLDL", "LDLCALC", "LDLDIRECT"  Physical Exam:    VS:  BP (!) 144/80   Pulse 75   Ht 5\' 5"  (1.651 m)   Wt 119 lb 9.6 oz (54.3 kg)   SpO2 97%   BMI 19.90 kg/m     Wt Readings  from Last 3 Encounters:  11/27/21 119 lb 9.6 oz (54.3 kg)  10/23/19 132 lb 6 oz (60 kg)  07/10/19 134 lb (60.8 kg)     GEN: Patient is in  no acute distress HEENT: Normal NECK: No JVD; No carotid bruits LYMPHATICS: No lymphadenopathy CARDIAC: S1 S2 regular, 2/6 systolic murmur at the apex. RESPIRATORY:  Clear to auscultation without rales, wheezing or rhonchi  ABDOMEN: Soft, non-tender, non-distended MUSCULOSKELETAL:  No edema; No deformity  SKIN: Warm and dry NEUROLOGIC:  Alert and oriented x 3 PSYCHIATRIC:  Normal affect    Signed, Garwin Brothers, MD  11/27/2021 2:17 PM    [facility]

## 2021-11-28 ENCOUNTER — Telehealth: Payer: Self-pay | Admitting: Internal Medicine

## 2021-11-28 LAB — CBC
Hematocrit: 38.7 % (ref 34.0–46.6)
Hemoglobin: 12.9 g/dL (ref 11.1–15.9)
MCH: 30.2 pg (ref 26.6–33.0)
MCHC: 33.3 g/dL (ref 31.5–35.7)
MCV: 91 fL (ref 79–97)
Platelets: 232 10*3/uL (ref 150–450)
RBC: 4.27 x10E6/uL (ref 3.77–5.28)
RDW: 13 % (ref 11.7–15.4)
WBC: 6.3 10*3/uL (ref 3.4–10.8)

## 2021-11-28 LAB — BASIC METABOLIC PANEL
BUN/Creatinine Ratio: 16 (ref 12–28)
BUN: 17 mg/dL (ref 8–27)
CO2: 23 mmol/L (ref 20–29)
Calcium: 10.1 mg/dL (ref 8.7–10.3)
Chloride: 101 mmol/L (ref 96–106)
Creatinine, Ser: 1.06 mg/dL — ABNORMAL HIGH (ref 0.57–1.00)
Glucose: 72 mg/dL (ref 70–99)
Potassium: 4.5 mmol/L (ref 3.5–5.2)
Sodium: 139 mmol/L (ref 134–144)
eGFR: 54 mL/min/{1.73_m2} — ABNORMAL LOW (ref >59)

## 2021-11-28 NOTE — Telephone Encounter (Signed)
Patient wants to reschedule [facility] Cath Lab procedure on 8/4.  Patient stated she has a family commitment at this time.

## 2021-11-28 NOTE — Telephone Encounter (Signed)
Cath changed as requested.

## 2021-12-08 ENCOUNTER — Telehealth: Payer: Self-pay | Admitting: *Deleted

## 2021-12-08 NOTE — Telephone Encounter (Signed)
Cardiac Catheterization scheduled at [facility] for: Tuesday December 09, 2021 9 AM Arrival time and place: [facility] Main Entrance A at: 7 AM  Nothing to eat after midnight prior to procedure, clear liquids until 5 AM day of procedure.  Medication instructions: -Hold:  Glipizide-AM of procedure -Except hold medications usual morning medications can be taken with sips of water including aspirin 81 mg.  Confirmed patient has responsible adult to drive home post procedure and be with patient first 24 hours after arriving home.  Patient reports no new symptoms concerning for COVID-19 in the past 10 days.  Reviewed procedure instructions with patient.

## 2021-12-09 ENCOUNTER — Encounter (HOSPITAL_COMMUNITY)
Admission: RE | Disposition: A | Discharge status: Home / Self Care | Payer: Self-pay | Source: Home / Self Care | Attending: Cardiovascular Disease

## 2021-12-09 ENCOUNTER — Ambulatory Visit (HOSPITAL_COMMUNITY)
Admission: RE | Admit: 2021-12-09 | Discharge: 2021-12-09 | Disposition: A | Discharge status: Home / Self Care | Payer: Medicare Other | Attending: Cardiovascular Disease | Admitting: Cardiovascular Disease

## 2021-12-09 ENCOUNTER — Other Ambulatory Visit: Payer: Medicare Other

## 2021-12-09 ENCOUNTER — Other Ambulatory Visit: Payer: Self-pay

## 2021-12-09 DIAGNOSIS — I471 Supraventricular tachycardia: Secondary | ICD-10-CM | POA: Diagnosis not present

## 2021-12-09 DIAGNOSIS — I25119 Atherosclerotic heart disease of native coronary artery with unspecified angina pectoris: Secondary | ICD-10-CM | POA: Insufficient documentation

## 2021-12-09 DIAGNOSIS — E782 Mixed hyperlipidemia: Secondary | ICD-10-CM | POA: Diagnosis not present

## 2021-12-09 DIAGNOSIS — R011 Cardiac murmur, unspecified: Secondary | ICD-10-CM

## 2021-12-09 DIAGNOSIS — R079 Chest pain, unspecified: Secondary | ICD-10-CM | POA: Diagnosis not present

## 2021-12-09 DIAGNOSIS — E119 Type 2 diabetes mellitus without complications: Secondary | ICD-10-CM | POA: Diagnosis not present

## 2021-12-09 DIAGNOSIS — I341 Nonrheumatic mitral (valve) prolapse: Secondary | ICD-10-CM | POA: Insufficient documentation

## 2021-12-09 DIAGNOSIS — I10 Essential (primary) hypertension: Secondary | ICD-10-CM | POA: Insufficient documentation

## 2021-12-09 DIAGNOSIS — Z789 Other specified health status: Secondary | ICD-10-CM

## 2021-12-09 DIAGNOSIS — I209 Angina pectoris, unspecified: Secondary | ICD-10-CM

## 2021-12-09 HISTORY — PX: LEFT HEART CATH AND CORONARY ANGIOGRAPHY: CATH118249

## 2021-12-09 LAB — GLUCOSE, CAPILLARY: Glucose-Capillary: 163 mg/dL — ABNORMAL HIGH (ref 70–99)

## 2021-12-09 SURGERY — LEFT HEART CATH AND CORONARY ANGIOGRAPHY
Anesthesia: LOCAL

## 2021-12-09 MED ORDER — SODIUM CHLORIDE 0.9 % IV SOLN: 250.0000 mL | INTRAVENOUS | Status: DC | PRN

## 2021-12-09 MED ORDER — SODIUM CHLORIDE 0.9% FLUSH: 3.0000 mL | Freq: Two times a day (BID) | INTRAVENOUS | Status: DC

## 2021-12-09 MED ORDER — SODIUM CHLORIDE 0.9% FLUSH: 3.0000 mL | INTRAVENOUS | Status: DC | PRN

## 2021-12-09 MED ORDER — LABETALOL HCL 5 MG/ML IV SOLN: 10.0000 mg | INTRAVENOUS | Status: DC | PRN

## 2021-12-09 MED ORDER — NITROGLYCERIN 1 MG/10 ML FOR IR/CATH LAB
INTRA_ARTERIAL | Status: AC
Start: 2021-12-09 — End: ?
  Filled 2021-12-09: qty 10

## 2021-12-09 MED ORDER — MIDAZOLAM HCL 2 MG/2ML IJ SOLN
INTRAMUSCULAR | Status: AC
  Filled 2021-12-09: qty 2

## 2021-12-09 MED ORDER — MIDAZOLAM HCL 2 MG/2ML IJ SOLN
INTRAMUSCULAR | Status: DC | PRN
  Administered 2021-12-09: 2 mg via INTRAVENOUS

## 2021-12-09 MED ORDER — ACETAMINOPHEN 325 MG PO TABS: 650.0000 mg | ORAL_TABLET | ORAL | Status: DC | PRN

## 2021-12-09 MED ORDER — HYDRALAZINE HCL 20 MG/ML IJ SOLN: 10.0000 mg | INTRAMUSCULAR | Status: DC | PRN

## 2021-12-09 MED ORDER — SODIUM CHLORIDE 0.9 % IV SOLN: INTRAVENOUS | Status: DC

## 2021-12-09 MED ORDER — ASPIRIN 81 MG PO CHEW: 81.0000 mg | CHEWABLE_TABLET | ORAL | Status: DC

## 2021-12-09 MED ORDER — HEPARIN SODIUM (PORCINE) 1000 UNIT/ML IJ SOLN
INTRAMUSCULAR | Status: AC
  Filled 2021-12-09: qty 10

## 2021-12-09 MED ORDER — LIDOCAINE HCL (PF) 1 % IJ SOLN
INTRAMUSCULAR | Status: DC | PRN
  Administered 2021-12-09: 2 mL

## 2021-12-09 MED ORDER — IOHEXOL 350 MG/ML SOLN
INTRAVENOUS | Status: DC | PRN
  Administered 2021-12-09: 45 mL

## 2021-12-09 MED ORDER — ONDANSETRON HCL 4 MG/2ML IJ SOLN: 4.0000 mg | Freq: Four times a day (QID) | INTRAMUSCULAR | Status: DC | PRN

## 2021-12-09 MED ORDER — HEPARIN SODIUM (PORCINE) 1000 UNIT/ML IJ SOLN
INTRAMUSCULAR | Status: DC | PRN
  Administered 2021-12-09: 2700 [IU] via INTRAVENOUS

## 2021-12-09 MED ORDER — HEPARIN (PORCINE) IN NACL 1000-0.9 UT/500ML-% IV SOLN
INTRAVENOUS | Status: DC | PRN
  Administered 2021-12-09 (×4): 500 mL

## 2021-12-09 MED ORDER — NITROGLYCERIN 1 MG/10 ML FOR IR/CATH LAB
INTRA_ARTERIAL | Status: DC | PRN
  Administered 2021-12-09: 200 ug

## 2021-12-09 MED ORDER — VERAPAMIL HCL 2.5 MG/ML IV SOLN
INTRAVENOUS | Status: AC
  Filled 2021-12-09: qty 2

## 2021-12-09 MED ORDER — SODIUM CHLORIDE 0.9 % WEIGHT BASED INFUSION: 1.0000 mL/kg/h | INTRAVENOUS | Status: DC

## 2021-12-09 MED ORDER — SODIUM CHLORIDE 0.9 % WEIGHT BASED INFUSION
3.0000 mL/kg/h | INTRAVENOUS | Status: AC
  Administered 2021-12-09: 3 mL/kg/h via INTRAVENOUS

## 2021-12-09 MED ORDER — HEPARIN (PORCINE) IN NACL 1000-0.9 UT/500ML-% IV SOLN
INTRAVENOUS | Status: AC
  Filled 2021-12-09: qty 1000

## 2021-12-09 MED ORDER — LIDOCAINE HCL (PF) 1 % IJ SOLN
INTRAMUSCULAR | Status: AC
  Filled 2021-12-09: qty 30

## 2021-12-09 SURGICAL SUPPLY — 12 items
BAND CMPR LRG ZPHR (HEMOSTASIS) ×1
BAND ZEPHYR COMPRESS 30 LONG (HEMOSTASIS) IMPLANT
CATH INFINITI JR4 5F (CATHETERS) IMPLANT
CATH OPTITORQUE TIG 4.0 5F (CATHETERS) IMPLANT
GLIDESHEATH SLEND SS 6F .021 (SHEATH) IMPLANT
GUIDEWIRE INQWIRE 1.5J.035X260 (WIRE) IMPLANT
INQWIRE 1.5J .035X260CM (WIRE) ×1
KIT HEART LEFT (KITS) ×1 IMPLANT
PACK CARDIAC CATHETERIZATION (CUSTOM PROCEDURE TRAY) ×1 IMPLANT
SHEATH PROBE COVER 6X72 (BAG) IMPLANT
TRANSDUCER W/STOPCOCK (MISCELLANEOUS) ×1 IMPLANT
TUBING CIL FLEX 10 FLL-RA (TUBING) ×1 IMPLANT

## 2021-12-09 NOTE — Interval H&P Note (Signed)
Cath Lab Visit (complete for each Cath Lab visit)  Clinical Evaluation Leading to the Procedure:   ACS: No.  Non-ACS:    Anginal Classification: CCS II  Anti-ischemic medical therapy: Minimal Therapy (1 class of medications)  Non-Invasive Test Results: No non-invasive testing performed  Prior CABG: No previous CABG      History and Physical Interval Note:  12/09/2021 9:29 AM  Jasmine Farrell  has presented today for surgery, with the diagnosis of ANGINA.  The various methods of treatment have been discussed with the patient and family. After consideration of risks, benefits and other options for treatment, the patient has consented to  Procedure(s): LEFT HEART CATH AND CORONARY ANGIOGRAPHY (N/A) as a surgical intervention.  The patient's history has been reviewed, patient examined, no change in status, stable for surgery.  I have reviewed the patient's chart and labs.  Questions were answered to the patient's satisfaction.     Nicki Guadalajara

## 2021-12-10 ENCOUNTER — Encounter (HOSPITAL_COMMUNITY): Payer: Self-pay | Admitting: Cardiovascular Disease

## 2021-12-16 ENCOUNTER — Other Ambulatory Visit: Payer: Medicare Other

## 2021-12-23 ENCOUNTER — Ambulatory Visit: Payer: Medicare Other | Attending: Cardiology

## 2021-12-23 DIAGNOSIS — R011 Cardiac murmur, unspecified: Secondary | ICD-10-CM | POA: Insufficient documentation

## 2021-12-23 DIAGNOSIS — I209 Angina pectoris, unspecified: Secondary | ICD-10-CM

## 2021-12-24 LAB — ECHOCARDIOGRAM COMPLETE
Area-P 1/2: 3.28 cm2
S' Lateral: 2.7 cm

## 2021-12-25 ENCOUNTER — Telehealth: Payer: Self-pay | Admitting: Cardiology

## 2021-12-25 NOTE — Telephone Encounter (Signed)
Pt states that the provider who done her cath did not discuss with her. Results discussed and pt had no additional questions.

## 2021-12-25 NOTE — Telephone Encounter (Signed)
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 She also states results of heart cath were never given to her.

## 2021-12-25 NOTE — Telephone Encounter (Signed)
Patient was returning call to go over results of echo

## 2021-12-25 NOTE — Telephone Encounter (Signed)
Patient was returning call to go over results of echo 

## 2021-12-25 NOTE — Telephone Encounter (Signed)
Results reviewed with pt as per Dr. Revankar's note.  Pt verbalized understanding and had no additional questions. Routed to PCP.  

## 2021-12-25 NOTE — Telephone Encounter (Signed)
error 

## 2022-06-01 ENCOUNTER — Other Ambulatory Visit: Payer: Self-pay

## 2022-06-02 ENCOUNTER — Ambulatory Visit: Payer: Medicare Other | Attending: Cardiology | Admitting: Cardiology

## 2022-06-02 ENCOUNTER — Encounter: Payer: Self-pay | Admitting: Cardiology

## 2022-06-02 VITALS — BP 132/84 | HR 88 | Ht 64.0 in | Wt 133.8 lb

## 2022-06-02 DIAGNOSIS — E782 Mixed hyperlipidemia: Secondary | ICD-10-CM | POA: Insufficient documentation

## 2022-06-02 DIAGNOSIS — I2510 Atherosclerotic heart disease of native coronary artery without angina pectoris: Secondary | ICD-10-CM

## 2022-06-02 DIAGNOSIS — E119 Type 2 diabetes mellitus without complications: Secondary | ICD-10-CM | POA: Diagnosis present

## 2022-06-02 DIAGNOSIS — I10 Essential (primary) hypertension: Secondary | ICD-10-CM

## 2022-06-02 HISTORY — DX: Atherosclerotic heart disease of native coronary artery without angina pectoris: I25.10

## 2022-06-02 NOTE — Progress Notes (Signed)
Cardiology Office Note:    Date:  06/02/2022   ID:  Jasmine Farrell, DOB 1945-12-09, MRN [id number]  PCP:  Maggie Schwalbe, PA-C  Cardiologist:  Jenean Lindau, MD   Referring MD: Maggie Schwalbe, PA-C    ASSESSMENT:    1. Essential hypertension   2. Diabetes mellitus without complication ([place])   3. Mixed hyperlipidemia   4. Coronary artery disease involving native coronary artery of native heart without angina pectoris    PLAN:    In order of problems listed above:  Coronary artery disease: Secondary prevention stressed with the patient.  Importance of compliance with diet medication stressed and she vocalized understanding.  She was advised to walk at least half an hour a day 5 days a week and she promises to do so. Essential hypertension: Blood pressure stable and diet was emphasized. Mixed dyslipidemia: Goal LDL must be less than 60.  I had a long talk with her about lipid-lowering medications, benefits and potential risks.  She is not keen on them at all.  She is very adamant about diet.  I told her about anti-inflammatory actions of statins and other medications.  I will respect her wishes and not initiate any medications per her wishes. Diabetes mellitus: Managed by primary care.  Diet emphasized. Patient will be seen in follow-up appointment in 6 months or earlier if the patient has any concerns    Medication Adjustments/Labs and Tests Ordered: Current medicines are reviewed at length with the patient today.  Concerns regarding medicines are outlined above.  No orders of the defined types were placed in this encounter.  No orders of the defined types were placed in this encounter.    No chief complaint on file.    History of Present Illness:    Jasmine Farrell is a 77 y.o. female.  Patient has past medical history of coronary artery disease, essential hypertension, mixed dyslipidemia and diabetes mellitus.  She denies any problems at this time and takes  care of activities of daily living.  No chest pain orthopnea or PND.  At the time of my evaluation, the patient is alert awake oriented and in no distress.  She walks on a regular basis but not optimally.  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   Cervical nerve root impingement    Cervical spondylosis with myelopathy and radiculopathy    Change in bowel habit    Chronic bilateral low back pain with left-sided sciatica    Chronic nausea    Closed fracture of left proximal humerus 09/29/2016   Colitis    Complication of anesthesia    narrow airway per pt can have a bp drop and hard to wake up   Constipation    DDD (degenerative disc disease), cervical 01/29/2016   DDD (degenerative disc disease), lumbar 02/18/2016   Diabetes mellitus type II, non insulin dependent ([place]) 10/27/2018   Diabetes mellitus without complication ([place])    Diffuse cystic mastopathy    Diverticulosis    Dysuria 10/30/2020   Elevated TSH 11/29/2019   Encounter for Medicare annual wellness exam 03/23/2019   Essential hypertension    Family history of adverse reaction to anesthesia  brother and sister hard to wake up   Fibrocystic breast    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   Generalized abdominal pain    GERD (gastroesophageal reflux disease)    Headache    Heart murmur    History of kidney stones    Hyperhidrosis 12/16/2017   Hyperlipidemia    Hyponatremia 09/16/2018   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[place])    Laceration of left thumb without foreign body without damage to nail 09/15/2017   Left-sided chest wall pain 10/11/2015   Loss of weight    Low serum cortisol level 09/21/2018   Low sodium syndrome    Lower respiratory infection (e.g., bronchitis,  pneumonia, pneumonitis, pulmonitis)    Mild renal insufficiency 10/27/2018   Mitral valve disorder    MVP   Mitral valve prolapse    Neck pain 01/29/2016   Neuralgia of chest 03/19/2021   Oral thrush    Paroxysmal atrial tachycardia    Peptic ulcer    PONV (postoperative nausea and vomiting)    Precordial pain    PSVT (paroxysmal supraventricular tachycardia)    PVC's (premature ventricular contractions)    Spider veins    Splinter of finger 02/27/2019   Formatting of this note might be different from the original. Added automatically from request for surgery [id number]   Statin intolerance 08/22/2021   Suspected COVID-19 virus infection 07/28/2018   Formatting of this note might be different from the original. 2020   SVT (supraventricular tachycardia)    Sweating abnormality 10/28/2017   Traumatic closed nondisplaced fracture of anatomical neck of left humerus with routine healing 04/27/2017   Varicose veins of bilateral lower extremities with other complications    Weakness     Past Surgical History:  Procedure Laterality Date   ABDOMINAL HYSTERECTOMY     ANTERIOR AND POSTERIOR VAGINAL REPAIR     ANTERIOR CERVICAL DECOMP/DISCECTOMY FUSION N/A 08/19/2016   Procedure: ANTERIOR CERVICAL DECOMPRESSION/DISCECTOMY FUSION CERVICAL FOUR- CERVICAL FIVE, CERVICAL FIVE- CERVICAL SIX;  Surgeon: Newman Pies, MD;  Location: [facility];  Service: Neurosurgery;  Laterality: N/A;  ANTERIOR CERVICAL DECOMPRESSION/DISCECTOMY FUSION CERVICAL 4- CERVICAL 5, CERVICAL 5- CERVICAL6   APPENDECTOMY     BREAST LUMPECTOMY     CHOLECYSTECTOMY     COLONOSCOPY WITH PROPOFOL N/A 10/29/2018   Procedure: COLONOSCOPY WITH PROPOFOL;  Surgeon: Gatha Mayer, MD;  Location: [facility];  Service: Endoscopy;  Laterality: N/A;   ELBOW SURGERY Right    HERNIA REPAIR     IR RADIOLOGIST EVAL & MGMT  10/04/2018   LEFT HEART CATH AND CORONARY ANGIOGRAPHY N/A 12/09/2021   Procedure: LEFT HEART CATH AND CORONARY ANGIOGRAPHY;   Surgeon: Troy Sine, MD;  Location: [facility];  Service: Cardiovascular;  Laterality: N/A;    Current Medications: Current Meds  Medication Sig   acetaminophen (TYLENOL) 650 MG CR tablet Take 650 mg by mouth every 8 (eight) hours as needed for pain.   ascorbic acid (VITAMIN C) 500 MG tablet Take 500 mg by mouth daily.   aspirin EC 81 MG tablet Take 81 mg by mouth daily. Swallow whole.   Cyanocobalamin (B-12) 2500 MCG TABS Take 5,000 mcg by mouth daily as needed (to boost the immune system). Sublingual   famotidine (PEPCID) 20 MG tablet Take 20 mg by mouth See admin instructions. 20 mg every morning, and 20 mg at bedtime if needed for acid reflux   glipiZIDE (GLUCOTROL XL) 2.5 MG 24 hr tablet Take 2.5 mg by mouth daily with breakfast.  lidocaine (LIDODERM) 5 % Place 1 patch onto the skin daily as needed (pain).   loratadine (CLARITIN) 10 MG tablet Take 10 mg by mouth daily as needed for allergies.   Multiple Minerals-Vitamins (GNP CAL MAG ZINC +D3 PO) Take 1 tablet by mouth daily. 1000 mg/ 400 mg/ 15 mg/ 600 units   nitroGLYCERIN (NITROSTAT) 0.4 MG SL tablet Place 0.4 mg under the tongue every 5 (five) minutes as needed for chest pain.   pantoprazole (PROTONIX) 40 MG tablet Take 40 mg by mouth daily.   Polyethyl Glycol-Propyl Glycol (SYSTANE ULTRA OP) Place 1 drop into both eyes 2 (two) times daily.   propranolol ER (INDERAL LA) 80 MG 24 hr capsule Take 80 mg by mouth daily.   sodium chloride 1 g tablet Take 1 tablet (1 g total) by mouth daily.     Allergies:   Dilaudid [hydromorphone], Fentanyl, Latex, Morphine and related, Aspirin, Codeine, Nsaids, Other, Oxycodone, Statins, Verapamil, and Adhesive [tape]   Social History   Socioeconomic History   Marital status: Married    Spouse name: Not on file   Number of children: 2   Years of education: Not on file   Highest education level: Not on file  Occupational History   Occupation: retired    Comment: Therapist, sports  Tobacco Use    Smoking status: Never   Smokeless tobacco: Never  Vaping Use   Vaping Use: Never used  Substance and Sexual Activity   Alcohol use: No   Drug use: No   Sexual activity: Not on file  Other Topics Concern   Not on file  Social History Narrative   Retired Therapist, sports   Married 2 grown children   No EtOH/tobacco/drugs   Social Determinants of Radio broadcast assistant Strain: Not on Art therapist Insecurity: Not on file  Transportation Needs: Not on file  Physical Activity: Not on file  Stress: Not on file  Social Connections: Not on file     Family History: The patient's family history includes Cancer in her mother and sister; Diverticulitis in her sister; Esophageal cancer in her father; Heart disease in her mother; Hypertension in her mother. There is no history of Colon cancer.  ROS:   Please see the history of present illness.    All other systems reviewed and are negative.  EKGs/Labs/Other Studies Reviewed:    The following studies were reviewed today: I discussed my findings with the patient at length.   Recent Labs: 11/27/2021: BUN 17; Creatinine, Ser 1.06; Hemoglobin 12.9; Platelets 232; Potassium 4.5; Sodium 139  Recent Lipid Panel No results found for: "CHOL", "TRIG", "HDL", "CHOLHDL", "VLDL", "LDLCALC", "LDLDIRECT"  Physical Exam:    VS:  BP 132/84   Pulse 88   Ht 5' 4"$  (1.626 m)   Wt 133 lb 12.8 oz (60.7 kg)   SpO2 96%   BMI 22.97 kg/m     Wt Readings from Last 3 Encounters:  06/02/22 133 lb 12.8 oz (60.7 kg)  12/09/21 118 lb (53.5 kg)  11/27/21 119 lb 9.6 oz (54.3 kg)     GEN: Patient is in no acute distress HEENT: Normal NECK: No JVD; No carotid bruits LYMPHATICS: No lymphadenopathy CARDIAC: Hear sounds regular, 2/6 systolic murmur at the apex. RESPIRATORY:  Clear to auscultation without rales, wheezing or rhonchi  ABDOMEN: Soft, non-tender, non-distended MUSCULOSKELETAL:  No edema; No deformity  SKIN: Warm and dry NEUROLOGIC:  Alert and oriented  x 3 PSYCHIATRIC:  Normal affect   Signed, Reita Cliche  Ava Deguire, MD  06/02/2022 1:43 PM    [place]

## 2022-06-02 NOTE — Patient Instructions (Signed)

## 2023-03-18 ENCOUNTER — Ambulatory Visit: Payer: Medicare Other | Admitting: Cardiology

## 2023-08-26 DIAGNOSIS — N1831 Chronic kidney disease, stage 3a: Secondary | ICD-10-CM | POA: Insufficient documentation

## 2023-11-03 ENCOUNTER — Ambulatory Visit: Attending: Cardiology | Admitting: Cardiology

## 2023-11-03 ENCOUNTER — Encounter: Payer: Self-pay | Admitting: Cardiology

## 2023-11-03 VITALS — BP 124/82 | HR 82 | Ht 64.0 in | Wt 123.0 lb

## 2023-11-03 DIAGNOSIS — I10 Essential (primary) hypertension: Secondary | ICD-10-CM

## 2023-11-03 DIAGNOSIS — I2510 Atherosclerotic heart disease of native coronary artery without angina pectoris: Secondary | ICD-10-CM

## 2023-11-03 DIAGNOSIS — E782 Mixed hyperlipidemia: Secondary | ICD-10-CM

## 2023-11-03 DIAGNOSIS — R002 Palpitations: Secondary | ICD-10-CM | POA: Diagnosis not present

## 2023-11-03 NOTE — Progress Notes (Unsigned)
 Cardiology Office Note   Date:  11/03/2023  ID:  Cicley, Ganesh 02-17-1946, MRN [id number] PCP: Hunter Mickey Browner, PA-C  [facility] Providers Cardiologist:  Verena Shawgo [name], MD Cardiology APP:  Carlin Delon [name], NP { Click to update primary MD,subspecialty MD or APP then REFRESH:1}    History of Present Illness Jasmine Farrell is a 78 y.o. female with a past medical history of nonobstructive CAD, hypertension, PSVT, venous insufficiency, GERD, DM2, dyslipidemia with statin intolerance.  12/23/2021 echo EF 60 to 65%, grade 1 DD 12/09/2021 left heart cath nonobstructive CAD  She established care with Dr. Crape in 2023 for the evaluation of chest pain, symptoms were concerning for angina so she underwent left heart cath on 12/09/2021 revealing nonobstructive CAD.  Most recently she was evaluated by Dr. Crape on 06/02/2022, no changes were made to her medications or plan of care, she was resolute in avoiding lipid-lowering medications and she was advised she can follow-up in 9 months.  She presents today for follow-up of her nonobstructive CAD.  She has been doing well from a cardiac perspective since she was last evaluated in our office.  She is a recently retired Engineer, civil (consulting), well versed  in her past medical history.  She takes excellent care of herself, is very active in her home also plays pickle ball on a routine basis however she has been bothered by orthopedic issues for the last few months. She denies chest pain, palpitations, dyspnea, pnd, orthopnea, n, v, dizziness, syncope, edema, weight gain, or early satiety.   ROS: Review of Systems  Musculoskeletal:  Positive for joint pain.  All other systems reviewed and are negative.    Studies Reviewed      Cardiac Studies & Procedures   ______________________________________________________________________________________________ CARDIAC CATHETERIZATION  CARDIAC CATHETERIZATION 12/09/2021  Conclusion   Prox RCA  lesion is 10% stenosed.   1st Diag lesion is 20% stenosed.   Ost LAD to Prox LAD lesion is 5% stenosed.   The left ventricular systolic function is normal.   LV end diastolic pressure is low.   The left ventricular ejection fraction is greater than 65% by visual estimate.  Minimal nonobstructive CAD with a dominant RCA.  Hyperdynamic LV function with EF estimate at least 65% without focal segmental wall motion abnormalities.  LVEDP 5 mmHg.  RECOMMENDATION: 81 mg for minimal CAD.  Optimal blood pressure and lipid management.  Findings Coronary Findings Diagnostic  Dominance: Right  Left Anterior Descending Ost LAD to Prox LAD lesion is 5% stenosed.  First Diagonal Branch Vessel is small in size. 1st Diag lesion is 20% stenosed.  Left Circumflex  Second Obtuse Marginal Branch Vessel is small in size.  Right Coronary Artery Prox RCA lesion is 10% stenosed.  Intervention  No interventions have been documented.     ECHOCARDIOGRAM  ECHOCARDIOGRAM COMPLETE 12/23/2021  Narrative ECHOCARDIOGRAM REPORT    Patient Name:   Jasmine Farrell Date of Exam: 12/23/2021 Medical Rec #:  [id number]         Height:       64.0 in Accession #:    [id number]        Weight:       118.0 lb Date of Birth:  03-Feb-1946         BSA:          1.563 m Patient Age:    76 years          BP:  139/79 mmHg Patient Gender: F                 HR:           79 bpm. Exam Location:  [facility]  Procedure: 2D Echo, Cardiac Doppler, Color Doppler and Strain Analysis  Indications:    Anginal pain (HCC) [I20.9 (ICD-10-CM)]; Cardiac murmur [R01.1 (ICD-10-CM)]  History:        Patient has no prior history of Echocardiogram examinations. Arrythmias:PVC; Risk Factors:Hypertension and Dyslipidemia.  Sonographer:    Charlie Jointer RDCS Referring Phys: CYRUS Aleighna Wojtas SAUNDERS [facility]  IMPRESSIONS   1. Left ventricular ejection fraction, by estimation, is 60 to 65%. The left ventricle has normal  function. The left ventricle has no regional wall motion abnormalities. Left ventricular diastolic parameters are consistent with Grade I diastolic dysfunction (impaired relaxation). 2. Right ventricular systolic function is normal. The right ventricular size is normal. There is normal pulmonary artery systolic pressure. 3. The mitral valve is normal in structure. No evidence of mitral valve regurgitation. No evidence of mitral stenosis. 4. The aortic valve is normal in structure. Aortic valve regurgitation is not visualized. No aortic stenosis is present. 5. The inferior vena cava is normal in size with greater than 50% respiratory variability, suggesting right atrial pressure of 3 mmHg.  FINDINGS Left Ventricle: Left ventricular ejection fraction, by estimation, is 60 to 65%. The left ventricle has normal function. The left ventricle has no regional wall motion abnormalities. The left ventricular internal cavity size was normal in size. There is no left ventricular hypertrophy. Left ventricular diastolic parameters are consistent with Grade I diastolic dysfunction (impaired relaxation).  Right Ventricle: The right ventricular size is normal. No increase in right ventricular wall thickness. Right ventricular systolic function is normal. There is normal pulmonary artery systolic pressure. The tricuspid regurgitant velocity is 2.42 m/s, and with an assumed right atrial pressure of 3 mmHg, the estimated right ventricular systolic pressure is 26.4 mmHg.  Left Atrium: Left atrial size was normal in size.  Right Atrium: Right atrial size was normal in size.  Pericardium: There is no evidence of pericardial effusion.  Mitral Valve: The mitral valve is normal in structure. No evidence of mitral valve regurgitation. No evidence of mitral valve stenosis.  Tricuspid Valve: The tricuspid valve is normal in structure. Tricuspid valve regurgitation is not demonstrated. No evidence of tricuspid  stenosis.  Aortic Valve: The aortic valve is normal in structure. Aortic valve regurgitation is not visualized. No aortic stenosis is present.  Pulmonic Valve: The pulmonic valve was normal in structure. Pulmonic valve regurgitation is not visualized. No evidence of pulmonic stenosis.  Aorta: The aortic root is normal in size and structure.  Venous: The inferior vena cava is normal in size with greater than 50% respiratory variability, suggesting right atrial pressure of 3 mmHg.  IAS/Shunts: No atrial level shunt detected by color flow Doppler.   LEFT VENTRICLE PLAX 2D LVIDd:         3.80 cm   Diastology LVIDs:         2.70 cm   LV e' medial:    8.92 cm/s LV PW:         0.90 cm   LV E/e' medial:  8.0 LV IVS:        1.20 cm   LV e' lateral:   7.29 cm/s LVOT diam:     1.90 cm   LV E/e' lateral: 9.8 LV SV:  54 LV SV Index:   35 LVOT Area:     2.84 cm   RIGHT VENTRICLE             IVC RV Basal diam:  1.90 cm     IVC diam: 1.70 cm RV S prime:     18.30 cm/s TAPSE (M-mode): 2.1 cm  LEFT ATRIUM             Index        RIGHT ATRIUM           Index LA diam:        3.20 cm 2.05 cm/m   RA Area:     12.10 cm LA Vol (A2C):   28.5 ml 18.23 ml/m  RA Volume:   24.90 ml  15.93 ml/m LA Vol (A4C):   21.3 ml 13.62 ml/m LA Biplane Vol: 25.1 ml 16.06 ml/m AORTIC VALVE LVOT Vmax:   87.50 cm/s LVOT Vmean:  60.400 cm/s LVOT VTI:    0.191 m  AORTA Ao Root diam: 3.30 cm Ao Asc diam:  3.30 cm Ao Desc diam: 2.20 cm  MITRAL VALVE               TRICUSPID VALVE MV Area (PHT): 3.28 cm    TR Peak grad:   23.4 mmHg MV Decel Time: 231 msec    TR Vmax:        242.00 cm/s MV E velocity: 71.10 cm/s MV A velocity: 86.50 cm/s  SHUNTS MV E/A ratio:  0.82        Systemic VTI:  0.19 m Systemic Diam: 1.90 cm  Coriana Angello Crape MD Electronically signed by [place] Diana Crape MD Signature Date/Time: 12/24/2021/12:11:09 PM    Final           ______________________________________________________________________________________________      Risk Assessment/Calculations           Physical Exam VS:  BP 124/82 (BP Location: Right Arm, Patient Position: Sitting)   Pulse 82   Ht 5' 4 (1.626 m)   Wt 123 lb (55.8 kg)   SpO2 96%   BMI 21.11 kg/m        Wt Readings from Last 3 Encounters:  11/03/23 123 lb (55.8 kg)  06/02/22 133 lb 12.8 oz (60.7 kg)  12/09/21 118 lb (53.5 kg)    GEN: Well nourished, well developed in no acute distress NECK: No JVD; No carotid bruits CARDIAC: RRR, no murmurs, rubs, gallops RESPIRATORY:  Clear to auscultation without rales, wheezing or rhonchi  ABDOMEN: Soft, non-tender, non-distended EXTREMITIES:  No edema; No deformity   ASSESSMENT AND PLAN CAD -nonobstructive per left heart cath in 2023, Stable with no anginal symptoms. No indication for ischemic evaluation.  Continue aspirin  81 mg daily, continue nitroglycerin  as needed--has not needed. Palpitations-currently quiescent, continue propranolol  80 mg daily Hyponatremia-on sodium supplementation, most recent sodium level a week ago was 133. Hypertension -  Dyslipidemia -      {Are you ordering a CV Procedure (e.g. stress test, cath, DCCV, TEE, etc)?   Press F2        :[id number]}  Dispo: ***  Signed, Delon [name] Hoover, NP

## 2023-11-03 NOTE — Patient Instructions (Signed)
